# Patient Record
Sex: Male | Born: 1950 | Race: White | Hispanic: No | Marital: Married | Smoking: Former smoker
Health system: Southern US, Community
[De-identification: ages and names within clinical notes are randomized; demographics above are authoritative.]

## PROBLEM LIST (undated history)

## (undated) DIAGNOSIS — I2699 Other pulmonary embolism without acute cor pulmonale: Secondary | ICD-10-CM

## (undated) DIAGNOSIS — G2111 Neuroleptic induced parkinsonism: Secondary | ICD-10-CM

## (undated) DIAGNOSIS — E039 Hypothyroidism, unspecified: Secondary | ICD-10-CM

## (undated) DIAGNOSIS — R0602 Shortness of breath: Secondary | ICD-10-CM

## (undated) DIAGNOSIS — E785 Hyperlipidemia, unspecified: Secondary | ICD-10-CM

## (undated) DIAGNOSIS — F419 Anxiety disorder, unspecified: Secondary | ICD-10-CM

## (undated) DIAGNOSIS — I10 Essential (primary) hypertension: Secondary | ICD-10-CM

## (undated) DIAGNOSIS — Z7901 Long term (current) use of anticoagulants: Secondary | ICD-10-CM

## (undated) DIAGNOSIS — Z9181 History of falling: Secondary | ICD-10-CM

## (undated) DIAGNOSIS — J449 Chronic obstructive pulmonary disease, unspecified: Secondary | ICD-10-CM

## (undated) DIAGNOSIS — I219 Acute myocardial infarction, unspecified: Secondary | ICD-10-CM

## (undated) DIAGNOSIS — F319 Bipolar disorder, unspecified: Secondary | ICD-10-CM

## (undated) HISTORY — PX: ROTATOR CUFF REPAIR: SHX139

---

## 2001-09-25 ENCOUNTER — Inpatient Hospital Stay (HOSPITAL_COMMUNITY): Admission: EM | Admit: 2001-09-25 | Discharge: 2001-10-14 | Payer: Self-pay | Admitting: Psychiatry

## 2001-10-16 ENCOUNTER — Other Ambulatory Visit (HOSPITAL_COMMUNITY): Admission: RE | Admit: 2001-10-16 | Discharge: 2001-10-17 | Payer: Self-pay | Admitting: Psychiatry

## 2004-10-15 DIAGNOSIS — I219 Acute myocardial infarction, unspecified: Secondary | ICD-10-CM

## 2004-10-15 HISTORY — DX: Acute myocardial infarction, unspecified: I21.9

## 2011-10-31 ENCOUNTER — Other Ambulatory Visit: Payer: Self-pay

## 2011-10-31 ENCOUNTER — Emergency Department (HOSPITAL_COMMUNITY): Payer: Medicare Other

## 2011-10-31 ENCOUNTER — Observation Stay (HOSPITAL_COMMUNITY)
Admission: EM | Admit: 2011-10-31 | Discharge: 2011-11-05 | Disposition: A | Discharge status: Nursing Home (Includes ICF) | Payer: Medicare Other | Attending: Internal Medicine | Admitting: Internal Medicine

## 2011-10-31 ENCOUNTER — Encounter (HOSPITAL_COMMUNITY): Payer: Self-pay | Admitting: Emergency Medicine

## 2011-10-31 DIAGNOSIS — R0989 Other specified symptoms and signs involving the circulatory and respiratory systems: Secondary | ICD-10-CM | POA: Insufficient documentation

## 2011-10-31 DIAGNOSIS — R0602 Shortness of breath: Principal | ICD-10-CM | POA: Insufficient documentation

## 2011-10-31 DIAGNOSIS — Z86718 Personal history of other venous thrombosis and embolism: Secondary | ICD-10-CM | POA: Insufficient documentation

## 2011-10-31 DIAGNOSIS — F411 Generalized anxiety disorder: Secondary | ICD-10-CM | POA: Insufficient documentation

## 2011-10-31 DIAGNOSIS — F0280 Dementia in other diseases classified elsewhere without behavioral disturbance: Secondary | ICD-10-CM | POA: Insufficient documentation

## 2011-10-31 DIAGNOSIS — G3183 Dementia with Lewy bodies: Secondary | ICD-10-CM | POA: Insufficient documentation

## 2011-10-31 DIAGNOSIS — F3130 Bipolar disorder, current episode depressed, mild or moderate severity, unspecified: Secondary | ICD-10-CM | POA: Insufficient documentation

## 2011-10-31 DIAGNOSIS — E785 Hyperlipidemia, unspecified: Secondary | ICD-10-CM | POA: Insufficient documentation

## 2011-10-31 DIAGNOSIS — R0600 Dyspnea, unspecified: Secondary | ICD-10-CM

## 2011-10-31 DIAGNOSIS — J449 Chronic obstructive pulmonary disease, unspecified: Secondary | ICD-10-CM

## 2011-10-31 DIAGNOSIS — Z79899 Other long term (current) drug therapy: Secondary | ICD-10-CM | POA: Insufficient documentation

## 2011-10-31 DIAGNOSIS — I519 Heart disease, unspecified: Secondary | ICD-10-CM | POA: Insufficient documentation

## 2011-10-31 DIAGNOSIS — F319 Bipolar disorder, unspecified: Secondary | ICD-10-CM

## 2011-10-31 DIAGNOSIS — R0789 Other chest pain: Secondary | ICD-10-CM

## 2011-10-31 DIAGNOSIS — R251 Tremor, unspecified: Secondary | ICD-10-CM

## 2011-10-31 DIAGNOSIS — J4489 Other specified chronic obstructive pulmonary disease: Secondary | ICD-10-CM | POA: Insufficient documentation

## 2011-10-31 DIAGNOSIS — I10 Essential (primary) hypertension: Secondary | ICD-10-CM | POA: Insufficient documentation

## 2011-10-31 DIAGNOSIS — IMO0001 Reserved for inherently not codable concepts without codable children: Secondary | ICD-10-CM | POA: Diagnosis present

## 2011-10-31 DIAGNOSIS — I2699 Other pulmonary embolism without acute cor pulmonale: Secondary | ICD-10-CM | POA: Insufficient documentation

## 2011-10-31 DIAGNOSIS — R259 Unspecified abnormal involuntary movements: Secondary | ICD-10-CM | POA: Insufficient documentation

## 2011-10-31 DIAGNOSIS — R0609 Other forms of dyspnea: Secondary | ICD-10-CM | POA: Insufficient documentation

## 2011-10-31 HISTORY — DX: Hyperlipidemia, unspecified: E78.5

## 2011-10-31 HISTORY — DX: Chronic obstructive pulmonary disease, unspecified: J44.9

## 2011-10-31 HISTORY — DX: Essential (primary) hypertension: I10

## 2011-10-31 HISTORY — DX: History of falling: Z91.81

## 2011-10-31 HISTORY — DX: Anxiety disorder, unspecified: F41.9

## 2011-10-31 HISTORY — DX: Bipolar disorder, unspecified: F31.9

## 2011-10-31 HISTORY — DX: Other pulmonary embolism without acute cor pulmonale: I26.99

## 2011-10-31 HISTORY — DX: Neuroleptic induced parkinsonism: G21.11

## 2011-10-31 LAB — CBC
HCT: 41.4 % (ref 39.0–52.0)
MCV: 85.2 fL (ref 78.0–100.0)
RBC: 4.86 MIL/uL (ref 4.22–5.81)
WBC: 9 10*3/uL (ref 4.0–10.5)

## 2011-10-31 LAB — COMPREHENSIVE METABOLIC PANEL
AST: 19 U/L (ref 0–37)
Albumin: 3.2 g/dL — ABNORMAL LOW (ref 3.5–5.2)
BUN: 14 mg/dL (ref 6–23)
Creatinine, Ser: 1.09 mg/dL (ref 0.50–1.35)
Potassium: 3.5 mEq/L (ref 3.5–5.1)
Total Protein: 6.8 g/dL (ref 6.0–8.3)

## 2011-10-31 LAB — CARDIAC PANEL(CRET KIN+CKTOT+MB+TROPI)
CK, MB: 3.7 ng/mL (ref 0.3–4.0)
Relative Index: 3.1 — ABNORMAL HIGH (ref 0.0–2.5)
Total CK: 120 U/L (ref 7–232)
Troponin I: <0.3 ng/mL (ref <0.30)

## 2011-10-31 LAB — PROTIME-INR: INR: 2.44 — ABNORMAL HIGH (ref 0.00–1.49)

## 2011-10-31 MED ORDER — MORPHINE SULFATE 4 MG/ML IJ SOLN
4.0000 mg | Freq: Once | INTRAMUSCULAR | Status: AC
  Administered 2011-10-31: 4 mg via INTRAVENOUS
  Filled 2011-10-31: qty 1

## 2011-10-31 MED ORDER — ASPIRIN 81 MG PO CHEW
324.0000 mg | CHEWABLE_TABLET | Freq: Once | ORAL | Status: AC
  Administered 2011-10-31: 324 mg via ORAL
  Filled 2011-10-31: qty 4

## 2011-10-31 MED ORDER — ALBUTEROL SULFATE (5 MG/ML) 0.5% IN NEBU
5.0000 mg | INHALATION_SOLUTION | Freq: Once | RESPIRATORY_TRACT | Status: AC
  Administered 2011-10-31: 5 mg via RESPIRATORY_TRACT
  Filled 2011-10-31: qty 1

## 2011-10-31 NOTE — ED Notes (Signed)
UJW:[id number]<YN> Expected date:10/31/11<BR> Expected time: 6:29 PM<BR> Means of arrival:Ambulance<BR> Comments:<BR> EMS 62 GC - subjective sob

## 2011-10-31 NOTE — ED Notes (Signed)
Per EMS, increased shortness of breath since this AM at appx 0700; lung sounds clear, respirations nonlabored; hx of PE; denies pain at this time; pt appears anxious

## 2011-10-31 NOTE — ED Provider Notes (Signed)
History     CSN: [id number]  Arrival date & time 10/31/11  1478   First MD Initiated Contact with Patient 10/31/11 1935      Chief Complaint  Patient presents with  . Shortness of Breath    (Consider location/radiation/quality/duration/timing/severity/associated sxs/prior treatment) Patient is a 61 y.o. male presenting with shortness of breath. The history is provided by the patient.  Shortness of Breath  The current episode started 3 to 5 days ago. The problem has been gradually worsening. The problem is moderate. Associated symptoms include chest pain, chest pressure and shortness of breath. Pertinent negatives include no fever, no cough and no wheezing.  Pt was discharged from hosp on Monday after being diagnosed with multiple PE's. Returns to ED with worsening SOB and chest tightness. No cough, fever, chills, abd pain, N/V/D.   Past Medical History  Diagnosis Date  . COPD (chronic obstructive pulmonary disease)   . Pulmonary embolus   . Hypertension   . Bipolar 1 disorder   . Neuroleptic-induced parkinsonism   . Anxiety   . Hyperlipidemia   . Falls infrequently     History reviewed. No pertinent past surgical history.  No family history on file.  History  Substance Use Topics  . Smoking status: Never Smoker   . Smokeless tobacco: Not on file  . Alcohol Use: No      Review of Systems  Constitutional: Negative for fever and chills.  Respiratory: Positive for chest tightness and shortness of breath. Negative for cough and wheezing.   Cardiovascular: Positive for chest pain.  Gastrointestinal: Negative for nausea, vomiting, abdominal pain and constipation.  Musculoskeletal: Negative for myalgias, back pain and arthralgias.  Skin: Negative for pallor and rash.  Neurological: Negative for weakness, numbness and headaches.    Allergies  Benzo-benzyl alc-cetylpridinium  Home Medications   Current Outpatient Rx  Name Route Sig Dispense Refill  . ALBUTEROL  SULFATE HFA 108 (90 BASE) MCG/ACT IN AERS Inhalation Inhale 2 puffs into the lungs every 6 (six) hours as needed.    Marland Kitchen DIVALPROEX SODIUM 250 MG PO TBEC Oral Take 250 mg by mouth at bedtime. Take 3-4 tablets by mouth everyday    . FENOFIBRATE 145 MG PO TABS Oral Take 145 mg by mouth daily.    Marland Kitchen FLUTICASONE-SALMETEROL 250-50 MCG/DOSE IN AEPB Inhalation Inhale 1 puff into the lungs 2 (two) times daily.    Marland Kitchen HYDROCHLOROTHIAZIDE 25 MG PO TABS Oral Take 25 mg by mouth daily.    Marland Kitchen LEVOTHYROXINE SODIUM 75 MCG PO TABS Oral Take 75 mcg by mouth daily.    Marland Kitchen METOPROLOL TARTRATE 25 MG PO TABS Oral Take 12.5 mg by mouth. Takes half a tablet    . OLANZAPINE 20 MG PO TABS Oral Take 20 mg by mouth at bedtime.    Marland Kitchen OXCARBAZEPINE 300 MG PO TABS Oral Take 300 mg by mouth 2 (two) times daily.    . WARFARIN SODIUM 2.5 MG PO TABS Oral Take 2.5 mg by mouth daily.      BP 135/88  Pulse 88  Temp(Src) 99.4 F (37.4 C) (Oral)  Resp 18  SpO2 97%  Physical Exam  Nursing note and vitals reviewed. Constitutional: He is oriented to person, place, and time. He appears well-developed and well-nourished.       Anxious appearing  HENT:  Head: Normocephalic and atraumatic.  Mouth/Throat: Oropharynx is clear and moist.  Eyes: EOM are normal. Pupils are equal, round, and reactive to light.  Neck: Normal range of  motion. Neck supple.  Cardiovascular: Normal rate and regular rhythm.   Pulmonary/Chest: Effort normal and breath sounds normal. No respiratory distress. He has no wheezes. He has no rales. He exhibits no tenderness.  Abdominal: Soft. Bowel sounds are normal. He exhibits no mass. There is no tenderness. There is no rebound and no guarding.  Musculoskeletal: Normal range of motion. He exhibits no edema and no tenderness.  Neurological: He is alert and oriented to person, place, and time.       Moves all ext, no gross deficits  Skin: Skin is warm and dry. No rash noted. No erythema.  Psychiatric: He has a normal  mood and affect. His behavior is normal.    ED Course  Procedures (including critical care time)  Labs Reviewed  COMPREHENSIVE METABOLIC PANEL - Abnormal; Notable for the following:    Sodium 133 (*)    Glucose, Bld 119 (*)    Albumin 3.2 (*)    Total Bilirubin 0.2 (*)    GFR calc non Af Amer 72 (*)    GFR calc Af Amer 83 (*)    All other components within normal limits  APTT - Abnormal; Notable for the following:    aPTT 41 (*)    All other components within normal limits  PROTIME-INR - Abnormal; Notable for the following:    Prothrombin Time 26.9 (*)    INR 2.44 (*)    All other components within normal limits  CARDIAC PANEL(CRET KIN+CKTOT+MB+TROPI) - Abnormal; Notable for the following:    Relative Index 3.1 (*)    All other components within normal limits  CBC   Dg Chest 2 View  10/31/2011  *RADIOLOGY REPORT*  Clinical Data: Shortness of breath.  No chest complaints.  History of COPD.  CHEST - 2 VIEW  Comparison: None.  Findings: Low lung volumes.  Cardiomegaly.  Small bilateral effusions are suspected.  Prominent central markings could represent bronchitic change or viral pneumonitis. No definite failure or infiltrates.  Advanced degenerative change right shoulder.  IMPRESSION: Low lung volumes with cardiomegaly.  Small bilateral effusions. Prominent central markings representing either bronchitic change or viral pneumonitis. No infiltrates or failure.  Original Report Authenticated By: Elsie Stain, M.D.     1. Dyspnea   2. Chest heaviness      Date: 10/31/2011  Rate:89  Rhythm: normal sinus rhythm  QRS Axis: normal  Intervals: normal  ST/T Wave abnormalities: normal  Conduction Disutrbances:none  Narrative Interpretation:   Old EKG Reviewed: none available   MDM  Will discuss admission with hospitalist        Loren Racer, MD 10/31/11 2354

## 2011-11-01 ENCOUNTER — Encounter (HOSPITAL_COMMUNITY): Payer: Self-pay | Admitting: Internal Medicine

## 2011-11-01 DIAGNOSIS — F411 Generalized anxiety disorder: Secondary | ICD-10-CM

## 2011-11-01 DIAGNOSIS — IMO0001 Reserved for inherently not codable concepts without codable children: Secondary | ICD-10-CM | POA: Diagnosis present

## 2011-11-01 DIAGNOSIS — R251 Tremor, unspecified: Secondary | ICD-10-CM

## 2011-11-01 DIAGNOSIS — J449 Chronic obstructive pulmonary disease, unspecified: Secondary | ICD-10-CM

## 2011-11-01 DIAGNOSIS — G20 Parkinson's disease: Secondary | ICD-10-CM

## 2011-11-01 DIAGNOSIS — I2699 Other pulmonary embolism without acute cor pulmonale: Secondary | ICD-10-CM

## 2011-11-01 DIAGNOSIS — F319 Bipolar disorder, unspecified: Secondary | ICD-10-CM

## 2011-11-01 DIAGNOSIS — G20A1 Parkinson's disease without dyskinesia, without mention of fluctuations: Secondary | ICD-10-CM

## 2011-11-01 MED ORDER — ADULT MULTIVITAMIN W/MINERALS CH
1.0000 | ORAL_TABLET | ORAL | Status: DC
  Administered 2011-11-01 – 2011-11-04 (×4): 1 via ORAL
  Filled 2011-11-01 (×5): qty 1

## 2011-11-01 MED ORDER — ALUM & MAG HYDROXIDE-SIMETH 200-200-20 MG/5ML PO SUSP: 30.0000 mL | Freq: Four times a day (QID) | ORAL | Status: DC | PRN

## 2011-11-01 MED ORDER — METOPROLOL TARTRATE 25 MG PO TABS
25.0000 mg | ORAL_TABLET | Freq: Two times a day (BID) | ORAL | Status: DC
  Administered 2011-11-01 – 2011-11-04 (×7): 25 mg via ORAL
  Filled 2011-11-01 (×8): qty 1

## 2011-11-01 MED ORDER — ALBUTEROL SULFATE (5 MG/ML) 0.5% IN NEBU: 2.5000 mg | INHALATION_SOLUTION | RESPIRATORY_TRACT | Status: DC | PRN

## 2011-11-01 MED ORDER — OLANZAPINE 10 MG PO TABS
20.0000 mg | ORAL_TABLET | Freq: Every day | ORAL | Status: DC
  Administered 2011-11-01 – 2011-11-04 (×4): 20 mg via ORAL
  Filled 2011-11-01 (×5): qty 2

## 2011-11-01 MED ORDER — SODIUM CHLORIDE 0.9 % IV SOLN: INTRAVENOUS | Status: DC

## 2011-11-01 MED ORDER — FENOFIBRATE 160 MG PO TABS
160.0000 mg | ORAL_TABLET | Freq: Every day | ORAL | Status: DC
  Administered 2011-11-01 – 2011-11-05 (×5): 160 mg via ORAL
  Filled 2011-11-01 (×5): qty 1

## 2011-11-01 MED ORDER — WARFARIN SODIUM 2.5 MG PO TABS
2.5000 mg | ORAL_TABLET | Freq: Every day | ORAL | Status: DC
  Filled 2011-11-01: qty 1

## 2011-11-01 MED ORDER — OXCARBAZEPINE 300 MG PO TABS
300.0000 mg | ORAL_TABLET | Freq: Two times a day (BID) | ORAL | Status: DC
  Administered 2011-11-01 – 2011-11-05 (×9): 300 mg via ORAL
  Filled 2011-11-01 (×10): qty 1

## 2011-11-01 MED ORDER — LEVOTHYROXINE SODIUM 75 MCG PO TABS
75.0000 ug | ORAL_TABLET | Freq: Every day | ORAL | Status: DC
  Administered 2011-11-01 – 2011-11-05 (×5): 75 ug via ORAL
  Filled 2011-11-01 (×5): qty 1

## 2011-11-01 MED ORDER — PROMETHAZINE HCL 25 MG PO TABS: 12.5000 mg | ORAL_TABLET | Freq: Four times a day (QID) | ORAL | Status: DC | PRN

## 2011-11-01 MED ORDER — DIVALPROEX SODIUM 250 MG PO DR TAB
250.0000 mg | DELAYED_RELEASE_TABLET | Freq: Every day | ORAL | Status: DC
  Administered 2011-11-01 – 2011-11-04 (×4): 250 mg via ORAL
  Filled 2011-11-01 (×5): qty 1

## 2011-11-01 MED ORDER — WARFARIN SODIUM 2.5 MG PO TABS
2.5000 mg | ORAL_TABLET | Freq: Once | ORAL | Status: AC
  Administered 2011-11-01: 2.5 mg via ORAL
  Filled 2011-11-01: qty 1

## 2011-11-01 MED ORDER — ACETAMINOPHEN 325 MG PO TABS
650.0000 mg | ORAL_TABLET | Freq: Four times a day (QID) | ORAL | Status: DC | PRN
  Filled 2011-11-01: qty 2

## 2011-11-01 MED ORDER — ALBUTEROL SULFATE HFA 108 (90 BASE) MCG/ACT IN AERS
2.0000 | INHALATION_SPRAY | Freq: Four times a day (QID) | RESPIRATORY_TRACT | Status: DC | PRN
  Filled 2011-11-01: qty 6.7

## 2011-11-01 MED ORDER — TIOTROPIUM BROMIDE MONOHYDRATE 18 MCG IN CAPS
18.0000 ug | ORAL_CAPSULE | Freq: Every day | RESPIRATORY_TRACT | Status: DC
  Administered 2011-11-01 – 2011-11-04 (×4): 18 ug via RESPIRATORY_TRACT
  Filled 2011-11-01 (×2): qty 5

## 2011-11-01 MED ORDER — ACETAMINOPHEN 650 MG RE SUPP: 650.0000 mg | Freq: Four times a day (QID) | RECTAL | Status: DC | PRN

## 2011-11-01 MED ORDER — DOCUSATE SODIUM 100 MG PO CAPS
100.0000 mg | ORAL_CAPSULE | Freq: Two times a day (BID) | ORAL | Status: DC
  Administered 2011-11-01 – 2011-11-05 (×9): 100 mg via ORAL
  Filled 2011-11-01 (×10): qty 1

## 2011-11-01 MED ORDER — MORPHINE SULFATE (CONCENTRATE) 20 MG/ML PO SOLN
5.0000 mg | ORAL | Status: DC | PRN
  Administered 2011-11-01: 5 mg via ORAL
  Filled 2011-11-01: qty 1

## 2011-11-01 MED ORDER — PROMETHAZINE HCL 25 MG/ML IJ SOLN: 12.5000 mg | Freq: Four times a day (QID) | INTRAMUSCULAR | Status: DC | PRN

## 2011-11-01 MED ORDER — FLUTICASONE-SALMETEROL 250-50 MCG/DOSE IN AEPB
1.0000 | INHALATION_SPRAY | Freq: Two times a day (BID) | RESPIRATORY_TRACT | Status: DC
Start: 2011-11-01 — End: 2011-11-05
  Administered 2011-11-01 – 2011-11-05 (×9): 1 via RESPIRATORY_TRACT
  Filled 2011-11-01 (×2): qty 14

## 2011-11-01 NOTE — Progress Notes (Signed)
  Echocardiogram 2D Echocardiogram has been performed.  Cathie Beams Deneen 11/01/2011, 12:17 PM

## 2011-11-01 NOTE — Progress Notes (Signed)
ANTICOAGULATION CONSULT NOTE - Initial Consult  Pharmacy Consult for Coumadin Indication: PE (new)   Allergies  Allergen Reactions  . Benzodiazepines Other (See Comments)    Causes aggression, paradoxical reaction    Patient Measurements: Height: 5\' 8"  (172.7 cm) Weight: 221 lb 1.6 oz (100.29 kg) IBW/kg (Calculated) : 68.4    Vital Signs: Temp: 98 F (36.7 C) (01/17 0545) Temp src: Oral (01/17 0407) BP: 121/77 mmHg (01/17 0545) Pulse Rate: 79  (01/17 0545)  Labs:  [name] 10/31/11 2027 10/31/11 2026  HGB -- 14.1  HCT -- 41.4  PLT -- 400  APTT -- 41*  LABPROT -- 26.9*  INR -- 2.44*  HEPARINUNFRC -- --  CREATININE -- 1.09  CKTOTAL 120 --  CKMB 3.7 --  TROPONINI <0.30 --   Estimated Creatinine Clearance: 82.8 ml/min (by C-G formula based on Cr of 1.09).  Medical History: Past Medical History  Diagnosis Date  . COPD (chronic obstructive pulmonary disease)   . Pulmonary embolus   . Hypertension   . Bipolar 1 disorder   . Neuroleptic-induced parkinsonism   . Anxiety   . Hyperlipidemia   . Falls infrequently     Medications:  Scheduled:    . albuterol  5 mg Nebulization Once  . aspirin  324 mg Oral Once  . divalproex  250 mg Oral QHS  . docusate sodium  100 mg Oral BID  . fenofibrate  160 mg Oral Daily  . Fluticasone-Salmeterol  1 puff Inhalation BID  . levothyroxine  75 mcg Oral Daily  . metoprolol tartrate  25 mg Oral BID  .  morphine injection  4 mg Intravenous Once  . mulitivitamin with minerals  1 tablet Oral Q24H  . OLANZapine  20 mg Oral QHS  . Oxcarbazepine  300 mg Oral BID  . tiotropium  18 mcg Inhalation Daily  . warfarin  2.5 mg Oral Daily   Infusions:    . sodium chloride      Assessment: 61 yo discharged from  [facility] on coumadin for large bilateral PEs.  Goal of Therapy:  INR 2-3   Plan:  Coumadin 2.5mg  x1 today. F/U PT/INR  daily   Derek Hicks 11/01/2011,6:53 AM

## 2011-11-01 NOTE — Progress Notes (Signed)
Derek Hicks is a 61 y.o. male patient admitted with shortness of breath associated with recently diagnosed PE and COPD. I have reviewed his chart, seen and examined him at bed side.  SUBJECTIVE "I feel ok".   1. Dyspnea   2. Chest heaviness   3. Pulmonary embolism     Past Medical History  Diagnosis Date  . COPD (chronic obstructive pulmonary disease)   . Pulmonary embolus   . Hypertension   . Bipolar 1 disorder   . Neuroleptic-induced parkinsonism   . Anxiety   . Hyperlipidemia   . Falls infrequently    Current Facility-Administered Medications  Medication Dose Route Frequency Provider Last Rate Last Dose  . acetaminophen (TYLENOL) tablet 650 mg  650 mg Oral Q6H PRN Anderson Malta       Or  . acetaminophen (TYLENOL) suppository 650 mg  650 mg Rectal Q6H PRN Anderson Malta      . albuterol (PROVENTIL HFA;VENTOLIN HFA) 108 (90 BASE) MCG/ACT inhaler 2 puff  2 puff Inhalation Q6H PRN Anderson Malta      . albuterol (PROVENTIL) (5 MG/ML) 0.5% nebulizer solution 2.5 mg  2.5 mg Nebulization Q2H PRN Anderson Malta      . albuterol (PROVENTIL) (5 MG/ML) 0.5% nebulizer solution 5 mg  5 mg Nebulization Once Loren Racer, MD   5 mg at 10/31/11 2349  . alum & mag hydroxide-simeth (MAALOX/MYLANTA) 200-200-20 MG/5ML suspension 30 mL  30 mL Oral Q6H PRN Anderson Malta      . aspirin chewable tablet 324 mg  324 mg Oral Once Loren Racer, MD   324 mg at 10/31/11 2033  . divalproex (DEPAKOTE) DR tablet 250 mg  250 mg Oral QHS Anderson Malta      . docusate sodium (COLACE) capsule 100 mg  100 mg Oral BID Anderson Malta   100 mg at 11/01/11 0912  . fenofibrate tablet 160 mg  160 mg Oral Daily Elizabeth Golding   160 mg at 11/01/11 0912  . Fluticasone-Salmeterol (ADVAIR) 250-50 MCG/DOSE inhaler 1 puff  1 puff Inhalation BID Anderson Malta   1 puff at 11/01/11 0850  . levothyroxine (SYNTHROID, LEVOTHROID) tablet 75 mcg  75 mcg Oral Daily Anderson Malta   75 mcg at  11/01/11 0912  . metoprolol tartrate (LOPRESSOR) tablet 25 mg  25 mg Oral BID Anderson Malta   25 mg at 11/01/11 0913  . morphine 20 MG/ML concentrated solution 5-10 mg  5-10 mg Oral Q2H PRN Anderson Malta      . morphine 4 MG/ML injection 4 mg  4 mg Intravenous Once Loren Racer, MD   4 mg at 10/31/11 2044  . mulitivitamin with minerals tablet 1 tablet  1 tablet Oral Q24H Anderson Malta      . OLANZapine (ZYPREXA) tablet 20 mg  20 mg Oral QHS Anderson Malta      . Oxcarbazepine (TRILEPTAL) tablet 300 mg  300 mg Oral BID Anderson Malta   300 mg at 11/01/11 0912  . promethazine (PHENERGAN) tablet 12.5 mg  12.5 mg Oral Q6H PRN Anderson Malta       Or  . promethazine (PHENERGAN) injection 12.5 mg  12.5 mg Intravenous Q6H PRN Anderson Malta      . tiotropium (SPIRIVA) inhalation capsule 18 mcg  18 mcg Inhalation Daily Anderson Malta   18 mcg at 11/01/11 0851  . warfarin (COUMADIN) tablet 2.5 mg  2.5 mg Oral ONCE-1800 Lorenza Evangelist, PHARMD      . DISCONTD: 0.9 %  sodium  chloride infusion   Intravenous Continuous Anderson Malta      . DISCONTD: warfarin (COUMADIN) tablet 2.5 mg  2.5 mg Oral Daily Anderson Malta       Allergies  Allergen Reactions  . Benzodiazepines Other (See Comments)    Causes aggression, paradoxical reaction   Active Problems:  Bipolar disorder  Pulmonary embolism  Shortness of breath dyspnea  Anxiety state  COPD (chronic obstructive pulmonary disease)  Tremor  Dementia in Parkinson's disease   Vital signs in last 24 hours: Temp:  [98 F (36.7 C)-99.4 F (37.4 C)] 98 F (36.7 C) (01/17 0545) Pulse Rate:  [79-88] 79  (01/17 0545) Resp:  [17-18] 17  (01/17 0545) BP: (121-135)/(77-88) 121/77 mmHg (01/17 0545) SpO2:  [95 %-97 %] 95 % (01/17 0852) Weight:  [100.29 kg (221 lb 1.6 oz)] 100.29 kg (221 lb 1.6 oz) (01/17 0545) Weight change:  Last BM Date: 10/31/11  Intake/Output from previous day:   Intake/Output this  shift: Total I/O In: 300 [P.O.:300] Out: 175 [Urine:175]  Lab Results:  [facility] 10/31/11 2026  WBC 9.0  HGB 14.1  HCT 41.4  PLT 400   BMET  Basename 10/31/11 2026  NA 133*  K 3.5  CL 96  CO2 25  GLUCOSE 119*  BUN 14  CREATININE 1.09  CALCIUM 9.5    Studies/Results: Dg Chest 2 View  10/31/2011  *RADIOLOGY REPORT*  Clinical Data: Shortness of breath.  No chest complaints.  History of COPD.  CHEST - 2 VIEW  Comparison: None.  Findings: Low lung volumes.  Cardiomegaly.  Small bilateral effusions are suspected.  Prominent central markings could represent bronchitic change or viral pneumonitis. No definite failure or infiltrates.  Advanced degenerative change right shoulder.  IMPRESSION: Low lung volumes with cardiomegaly.  Small bilateral effusions. Prominent central markings representing either bronchitic change or viral pneumonitis. No infiltrates or failure.  Original Report Authenticated By: Elsie Stain, M.D.    Medications: I have reviewed the patient's current medications.   Physical exam GENERAL- alert HEAD- normal atraumatic, no neck masses, normal thyroid, no jvd RESPIRATORY- appears well, vitals normal, no respiratory distress, acyanotic, normal RR, ear and throat exam is normal, neck free of mass or lymphadenopathy, chest clear, no wheezing, crepitations, rhonchi, normal symmetric air entry CVS- regular rate and rhythm, S1, S2 normal, no murmur, click, rub or gallop ABDOMEN- abdomen is soft without significant tenderness, masses, organomegaly or guarding NEURO- no acute deficits. EXTREMITIES- extremities normal, atraumatic, no cyanosis or edema  Plan 1. Bipolar disorder/Anxiety state - seems stable. Continue home meds. 2. Pulmonary embolism/ Shortness of breath dyspnea/COPD (chronic obstructive pulmonary disease)- says less dyspneic. Await 2decho. D/C ivf.  3. Tremor/ Dementia in Parkinson's disease- stable. Continue current mx.  4. Disposition- eventually  back to facility.    Ger Ringenberg 11/01/2011 2:05 PM Pager: [phone].

## 2011-11-01 NOTE — H&P (Signed)
Hospital Admission Note Date: 11/01/2011  Patient name: Derek Hicks Medical record number: [id number] Date of birth: [birth year] Age: 61 y.o. Gender: male PCP: No primary provider on file.  Medical Service: [facility] Team 5  Chief Complaint: Dyspnea, SOB  History of Present Illness: Derek Hicks is a pleasant, but very unfortunate gentleman who has a past medical history of severe depression and a parkinson's like neurodegenerative disoprder that at least once a year renders him completely bedbound and contributes to generalized immobility. 1 year ago he developed a DVT and was placed on coumadin therapy for 6 months, recently discontinued. He was admitted at [facility] days ago and was found to have large bilateral PE's (records requested). He and his wife feel like he was dischareged prematurely as he is still significantly dyspniec, struggling with air hunger and feels intense anxiety related to his difficulty breathing. "I feel like I am dying". They did not want to return to [facility] and the patient just recently moved into [facility] which is closer to [facility].   Meds: Medications Prior to Admission  Medication Dose Route Frequency Provider Last Rate Last Dose  . albuterol (PROVENTIL) (5 MG/ML) 0.5% nebulizer solution 5 mg  5 mg Nebulization Once Loren Racer, MD   5 mg at 10/31/11 2349  . aspirin chewable tablet 324 mg  324 mg Oral Once Loren Racer, MD   324 mg at 10/31/11 2033  . morphine 4 MG/ML injection 4 mg  4 mg Intravenous Once Loren Racer, MD   4 mg at 10/31/11 2044   No current outpatient prescriptions on file as of 10/31/2011.    Allergies: Benzodiazepines Past Medical History  Diagnosis Date  . COPD (chronic obstructive pulmonary disease)   . Pulmonary embolus   . Hypertension   . Bipolar 1 disorder   . Neuroleptic-induced parkinsonism   . Anxiety   . Hyperlipidemia   . Falls infrequently    History reviewed. No pertinent past surgical history. No family  history on file. History   Social History  . Marital Status: Married    Spouse Name: N/A    Number of Children: N/A  . Years of Education: N/A   Occupational History  . Not on file.   Social History Main Topics  . Smoking status: Never Smoker   . Smokeless tobacco: Not on file  . Alcohol Use: No  . Drug Use: No  . Sexually Active:    Other Topics Concern  . Not on file   Social History Narrative  . No narrative on file    Review of Systems: Pertinent items are noted in HPI.  Physical Exam: Blood pressure 135/88, pulse 88, temperature 99.4 F (37.4 C), temperature source Oral, resp. rate 18, SpO2 95.00%. General appearance: alert, cooperative, mild distress, pale and slowed mentation Head: Normocephalic, without obvious abnormality, atraumatic Lungs: diminished breath sounds anterior - bilateral, bibasilar and bilaterally and wheezes bilaterally Chest wall: no tenderness Heart: regular rate and rhythm, S1, S2 normal, no murmur, click, rub or gallop Abdomen: soft, non-tender; bowel sounds normal; no masses,  no organomegaly Extremities: extremities normal, atraumatic, no cyanosis or edema Pulses: 2+ and symmetric Skin: Skin color, texture, turgor normal. No rashes or lesions Neurologic: Mental status: Alert, oriented, thought content appropriate, severe resting tremor, mild rigidity, sluggish mental ability  Lab results: Basic Metabolic Panel:  Basename 10/31/11 2026  NA 133*  K 3.5  CL 96  CO2 25  GLUCOSE 119*  BUN 14  CREATININE 1.09  CALCIUM  9.5  MG --  PHOS --   Liver Function Tests:  [facility] 10/31/11 2026  AST 19  ALT 22  ALKPHOS 45  BILITOT 0.2*  PROT 6.8  ALBUMIN 3.2*   No results found for this basename: LIPASE:2,AMYLASE:2 in the last 72 hours No results found for this basename: AMMONIA:2 in the last 72 hours CBC:  Basename 10/31/11 2026  WBC 9.0  NEUTROABS --  HGB 14.1  HCT 41.4  MCV 85.2  PLT 400   Cardiac Enzymes:  Basename  10/31/11 2027  CKTOTAL 120  CKMB 3.7  CKMBINDEX --  TROPONINI <0.30   Coagulation:  Basename 10/31/11 2026  LABPROT 26.9*  INR 2.44*   Imaging results:  Dg Chest 2 View  10/31/2011  *RADIOLOGY REPORT*  Clinical Data: Shortness of breath.  No chest complaints.  History of COPD.  CHEST - 2 VIEW  Comparison: None.  Findings: Low lung volumes.  Cardiomegaly.  Small bilateral effusions are suspected.  Prominent central markings could represent bronchitic change or viral pneumonitis. No definite failure or infiltrates.  Advanced degenerative change right shoulder.  IMPRESSION: Low lung volumes with cardiomegaly.  Small bilateral effusions. Prominent central markings representing either bronchitic change or viral pneumonitis. No infiltrates or failure.  Original Report Authenticated By: Elsie Stain, M.D.    Other results: EKG: normal EKG, normal sinus rhythm, unchanged from previous tracings, nonspecific ST and T waves changes.  Assessment & Plan by Problem: Active Problems:  Bipolar disorder  Pulmonary embolism  Shortness of breath dyspnea  Anxiety state  COPD (chronic obstructive pulmonary disease)  Tremor  Dementia in Parkinson's disease  1. Dyspnea, newly diagnosed Pulmonary embolism: Main reason he returned to hospital was poor symptom management, he continues to feel air hunger and is severely dyspneic. Fortunately hemodynamically he is stable. I mostly provided reassurance and education. I recommended starting low dose oral morphine Sublingual for rapid control of his SOB, will start prn, this can also continue on discharge-will help prevent re-admit for SOB. Ideally a benzo would be best but he has severe paradoxical  Rxn in past. I explained PE can take time to resolve- this may take several weeks/months to resolve. I also maximized his COPD regimen and added Spiriva and nebs. No evidence of right heart strain on EKG, will order 2D echo to r/o RHF or new strain RVOF problems. Will  need close outpatient follow-up.   2. Tremor/Neurodegenerative dx: Unclear etiology, chronic problem, not acute,. Being followed by outpatient neurology.  3. HTN: I discontinued his HCTZ because his Na was mildly low, should continue Beta blocker for BP control.  4. Anxiety/Depression: I discussed continuing his current medication regimen and following with psych in [facility]  5. Dispo: He is a new resident at [facility], he wants to return there when stable for discharge.  SignedAnderson Malta 11/01/2011, 1:24 AM

## 2011-11-02 LAB — BASIC METABOLIC PANEL
Chloride: 103 mEq/L (ref 96–112)
Creatinine, Ser: 1.07 mg/dL (ref 0.50–1.35)
GFR calc Af Amer: 85 mL/min — ABNORMAL LOW (ref >90)
Potassium: 4.6 mEq/L (ref 3.5–5.1)
Sodium: 135 mEq/L (ref 135–145)

## 2011-11-02 LAB — PRO B NATRIURETIC PEPTIDE: Pro B Natriuretic peptide (BNP): 210.7 pg/mL — ABNORMAL HIGH (ref 0–125)

## 2011-11-02 LAB — CBC
MCV: 87.5 fL (ref 78.0–100.0)
Platelets: 350 10*3/uL (ref 150–400)
RDW: 13.6 % (ref 11.5–15.5)
WBC: 6.8 10*3/uL (ref 4.0–10.5)

## 2011-11-02 LAB — PROTIME-INR
INR: 2.22 — ABNORMAL HIGH (ref 0.00–1.49)
Prothrombin Time: 25 seconds — ABNORMAL HIGH (ref 11.6–15.2)

## 2011-11-02 MED ORDER — FUROSEMIDE 20 MG PO TABS
20.0000 mg | ORAL_TABLET | Freq: Every day | ORAL | Status: DC
  Administered 2011-11-02 – 2011-11-05 (×4): 20 mg via ORAL
  Filled 2011-11-02 (×4): qty 1

## 2011-11-02 MED ORDER — WARFARIN SODIUM 2.5 MG PO TABS
2.5000 mg | ORAL_TABLET | Freq: Once | ORAL | Status: AC
  Administered 2011-11-02: 2.5 mg via ORAL
  Filled 2011-11-02: qty 1

## 2011-11-02 MED ORDER — POTASSIUM CHLORIDE CRYS ER 10 MEQ PO TBCR
10.0000 meq | EXTENDED_RELEASE_TABLET | Freq: Every day | ORAL | Status: DC
  Administered 2011-11-02 – 2011-11-05 (×4): 10 meq via ORAL
  Filled 2011-11-02 (×4): qty 1

## 2011-11-02 NOTE — Progress Notes (Signed)
CSW visited with the pt and completed psychosocial assessment (pls see shadow chart). Pt reports he has been a resident of [facility] for 2 days and he plans to return at discharge. Pt states he has great support from his wife. CSW spoke with Signe Colt at the facility and she reports the pt can return at discharge. CSW will continue to follow and offer support. Patrice Paradise, LCSWA 11/02/2011 10:20 AM [phone]

## 2011-11-02 NOTE — Progress Notes (Signed)
SUBJECTIVE Feels better. Less dyspneic.   1. Dyspnea   2. Chest heaviness   3. Pulmonary embolism     Past Medical History  Diagnosis Date  . COPD (chronic obstructive pulmonary disease)   . Pulmonary embolus   . Hypertension   . Bipolar 1 disorder   . Neuroleptic-induced parkinsonism   . Anxiety   . Hyperlipidemia   . Falls infrequently    Current Facility-Administered Medications  Medication Dose Route Frequency Provider Last Rate Last Dose  . acetaminophen (TYLENOL) tablet 650 mg  650 mg Oral Q6H PRN Anderson Malta, DO       Or  . acetaminophen (TYLENOL) suppository 650 mg  650 mg Rectal Q6H PRN Anderson Malta, DO      . albuterol (PROVENTIL HFA;VENTOLIN HFA) 108 (90 BASE) MCG/ACT inhaler 2 puff  2 puff Inhalation Q6H PRN Anderson Malta, DO      . albuterol (PROVENTIL) (5 MG/ML) 0.5% nebulizer solution 2.5 mg  2.5 mg Nebulization Q2H PRN Anderson Malta, DO      . alum & mag hydroxide-simeth (MAALOX/MYLANTA) 200-200-20 MG/5ML suspension 30 mL  30 mL Oral Q6H PRN Anderson Malta, DO      . divalproex (DEPAKOTE) DR tablet 250 mg  250 mg Oral QHS Anderson Malta, DO   250 mg at 11/01/11 2209  . docusate sodium (COLACE) capsule 100 mg  100 mg Oral BID Anderson Malta, DO   100 mg at 11/02/11 1047  . fenofibrate tablet 160 mg  160 mg Oral Daily Anderson Malta, DO   160 mg at 11/02/11 1047  . Fluticasone-Salmeterol (ADVAIR) 250-50 MCG/DOSE inhaler 1 puff  1 puff Inhalation BID Anderson Malta, DO   1 puff at 11/02/11 2057  . furosemide (LASIX) tablet 20 mg  20 mg Oral Daily Tonie Elsey, MD   20 mg at 11/02/11 1601  . levothyroxine (SYNTHROID, LEVOTHROID) tablet 75 mcg  75 mcg Oral Daily Anderson Malta, DO   75 mcg at 11/02/11 1047  . metoprolol tartrate (LOPRESSOR) tablet 25 mg  25 mg Oral BID Anderson Malta, DO   25 mg at 11/02/11 1047  . morphine 20 MG/ML concentrated solution 5-10 mg  5-10 mg Oral Q2H PRN Anderson Malta, DO   5 mg at 11/01/11 1509   . mulitivitamin with minerals tablet 1 tablet  1 tablet Oral Q24H Anderson Malta, DO   1 tablet at 11/02/11 1827  . OLANZapine (ZYPREXA) tablet 20 mg  20 mg Oral QHS Anderson Malta, DO   20 mg at 11/01/11 2208  . Oxcarbazepine (TRILEPTAL) tablet 300 mg  300 mg Oral BID Anderson Malta, DO   300 mg at 11/02/11 1047  . potassium chloride (K-DUR,KLOR-CON) CR tablet 10 mEq  10 mEq Oral Daily Ivory Bail, MD   10 mEq at 11/02/11 1601  . promethazine (PHENERGAN) tablet 12.5 mg  12.5 mg Oral Q6H PRN Anderson Malta, DO       Or  . promethazine (PHENERGAN) injection 12.5 mg  12.5 mg Intravenous Q6H PRN Anderson Malta, DO      . tiotropium [facility]) inhalation capsule 18 mcg  18 mcg Inhalation Daily Anderson Malta, DO   18 mcg at 11/02/11 0917  . warfarin (COUMADIN) tablet 2.5 mg  2.5 mg Oral ONCE-1800 Christine Lanora Manis [place], PHARMD   2.5 mg at 11/02/11 1827   Allergies  Allergen Reactions  . Benzodiazepines Other (See Comments)    Causes aggression, paradoxical reaction   Active Problems:  Bipolar disorder  Pulmonary embolism  Shortness of  breath dyspnea  Anxiety state  COPD (chronic obstructive pulmonary disease)  Tremor  Dementia in Parkinson's disease   Vital signs in last 24 hours: Temp:  [97.6 F (36.4 C)-98.4 F (36.9 C)] 97.6 F (36.4 C) (01/18 2057) Pulse Rate:  [58-79] 79  (01/18 2057) Resp:  [18-20] 18  (01/18 2057) BP: (112-150)/(74-87) 150/87 mmHg (01/18 2057) SpO2:  [93 %-98 %] 93 % (01/18 2057) Weight change:  Last BM Date: 11/02/11  Intake/Output from previous day: 01/17 0701 - 01/18 0700 In: 1840 [P.O.:940; I.V.:900] Out: 1550 [Urine:1550] Intake/Output this shift:    Lab Results:  Basename 11/02/11 0430 10/31/11 2026  WBC 6.8 9.0  HGB 13.1 14.1  HCT 39.8 41.4  PLT 350 400   BMET  Basename 11/02/11 0430 10/31/11 2026  NA 135 133*  K 4.6 3.5  CL 103 96  CO2 22 25  GLUCOSE 101* 119*  BUN 14 14  CREATININE 1.07 1.09  CALCIUM  8.5 9.5    Studies/Results: No results found.  Medications: I have reviewed the patient's current medications.   Physical exam GENERAL- alert HEAD- normal atraumatic, no neck masses, normal thyroid, no jvd RESPIRATORY- appears well, vitals normal, no respiratory distress, acyanotic, normal RR, ear and throat exam is normal, neck free of mass or lymphadenopathy, chest clear, no wheezing, crepitations, rhonchi, normal symmetric air entry CVS- regular rate and rhythm, S1, S2 normal, no murmur, click, rub or gallop ABDOMEN- abdomen is soft without significant tenderness, masses, organomegaly or guarding NEURO- speech impediment. EXTREMITIES- extremities normal, atraumatic, no cyanosis or edema  Plan  1. Bipolar disorder/Anxiety state  - seems stable. Continue home meds.  2. Pulmonary embolism/ Shortness of breath dyspnea/COPD (chronic obstructive pulmonary disease)- says less dyspneic.  2decho shows diastolic dysfunction. Start lasix.  3. Tremor/ Dementia in Parkinson's disease- stable. Continue current mx.  4. Disposition- eventually back to facility.         Derek Hicks 11/02/2011 9:16 PM Pager: [phone].

## 2011-11-02 NOTE — Progress Notes (Signed)
ANTICOAGULATION CONSULT NOTE  Pharmacy Consult for Coumadin Indication: PE (new)   Allergies  Allergen Reactions  . Benzodiazepines Other (See Comments)    Causes aggression, paradoxical reaction    Patient Measurements: Height: 5\' 8"  (172.7 cm) Weight: 221 lb 1.6 oz (100.29 kg) IBW/kg (Calculated) : 68.4    Vital Signs: Temp: 97.6 F (36.4 C) (01/18 0516) Temp src: Oral (01/18 0516) BP: 121/81 mmHg (01/18 0516) Pulse Rate: 58  (01/18 0516)  Labs:  Basename 11/02/11 0430 10/31/11 2027 10/31/11 2026  HGB 13.1 -- 14.1  HCT 39.8 -- 41.4  PLT 350 -- 400  APTT -- -- 41*  LABPROT 25.0* -- 26.9*  INR 2.22* -- 2.44*  HEPARINUNFRC -- -- --  CREATININE 1.07 -- 1.09  CKTOTAL -- 120 --  CKMB -- 3.7 --  TROPONINI -- <0.30 --   Estimated Creatinine Clearance: 84.3 ml/min (by C-G formula based on Cr of 1.07).  Medical History: Past Medical History  Diagnosis Date  . COPD (chronic obstructive pulmonary disease)   . Pulmonary embolus   . Hypertension   . Bipolar 1 disorder   . Neuroleptic-induced parkinsonism   . Anxiety   . Hyperlipidemia   . Falls infrequently     Medications:  Scheduled:     . divalproex  250 mg Oral QHS  . docusate sodium  100 mg Oral BID  . fenofibrate  160 mg Oral Daily  . Fluticasone-Salmeterol  1 puff Inhalation BID  . levothyroxine  75 mcg Oral Daily  . metoprolol tartrate  25 mg Oral BID  . mulitivitamin with minerals  1 tablet Oral Q24H  . OLANZapine  20 mg Oral QHS  . Oxcarbazepine  300 mg Oral BID  . tiotropium  18 mcg Inhalation Daily  . warfarin  2.5 mg Oral ONCE-1800    Assessment:  61 yo discharged from  [facility] on 1/14 on coumadin 2.5mg  PO daily for large bilateral PEs.  He has returned to the hospital on 1/16 with further symptoms   Therapeutic INR  Goal of Therapy:  INR 2-3   Plan:   Coumadin 2.5mg  PO today  Follow up PT/INR  daily   Lynann Beaver PharmD  Pager [phone] 11/02/2011 10:30 AM

## 2011-11-03 LAB — BASIC METABOLIC PANEL
BUN: 16 mg/dL (ref 6–23)
Chloride: 101 mEq/L (ref 96–112)
Glucose, Bld: 99 mg/dL (ref 70–99)
Potassium: 4 mEq/L (ref 3.5–5.1)

## 2011-11-03 LAB — CBC
HCT: 40.7 % (ref 39.0–52.0)
Hemoglobin: 13.5 g/dL (ref 13.0–17.0)
MCH: 28.8 pg (ref 26.0–34.0)
MCHC: 33.2 g/dL (ref 30.0–36.0)

## 2011-11-03 MED ORDER — WARFARIN SODIUM 2.5 MG PO TABS
2.5000 mg | ORAL_TABLET | Freq: Once | ORAL | Status: AC
  Administered 2011-11-03: 2.5 mg via ORAL
  Filled 2011-11-03: qty 1

## 2011-11-03 NOTE — Progress Notes (Signed)
Physical Therapy Evaluation Patient Details Name: Derek Hicks MRN: [id number] DOB: 1951-08-11 Today's Date: 11/03/2011  Pt is independent in functional mobility. No PT recommendations.  Problem List:  Patient Active Problem List  Diagnoses  . Bipolar disorder  . Pulmonary embolism  . Shortness of breath dyspnea  . Anxiety state  . COPD (chronic obstructive pulmonary disease)  . Tremor  . Dementia in Parkinson's disease    Past Medical History:  Past Medical History  Diagnosis Date  . COPD (chronic obstructive pulmonary disease)   . Pulmonary embolus   . Hypertension   . Bipolar 1 disorder   . Neuroleptic-induced parkinsonism   . Anxiety   . Hyperlipidemia   . Falls infrequently    Past Surgical History: History reviewed. No pertinent past surgical history.  PT Assessment/Plan/Recommendation PT Assessment Clinical Impression Statement: Pt is independent with functional mobility and gait. No follow up PT or DME PT Recommendation/Assessment: Patent does not need any further PT services No Skilled PT: Patient is independent with all acitivity/mobility PT Recommendation Follow Up Recommendations: No PT follow up Equipment Recommended: None recommended by PT PT Goals     PT Evaluation Precautions/Restrictions  Precautions Required Braces or Orthoses: No Restrictions Weight Bearing Restrictions: No Prior Functioning  Home Living Lives With: Other (Comment) (plans to go to an assisted living facility) Prior Function Level of Independence: Independent with gait;Other (comment) (sometimes uses a cane) Cognition Cognition Arousal/Alertness: Awake/alert Overall Cognitive Status: Appears within functional limits for tasks assessed Orientation Level: Oriented X4 Sensation/Coordination Coordination Gross Motor Movements are Fluid and Coordinated: No (pt with slight tremor noted) Coordination and Movement Description: pt appears to have some defecit in changing speeds  and coordination of movement, but he is functional with no balance loss Extremity Assessment RLE Assessment RLE Assessment: Within Functional Limits LLE Assessment LLE Assessment: Within Functional Limits Mobility (including Balance) Bed Mobility Bed Mobility: Yes Supine to Sit: 7: Independent Sit to Supine: 7: Independent Transfers Stand to Sit: 7: Independent Ambulation/Gait Ambulation/Gait: Yes Ambulation/Gait Assistance: 7: Independent Ambulation Distance (Feet): 400 Feet Assistive device: None Gait Pattern: Step-through pattern ( keeps trunk rigid but is able to turn head, can arm swing) Gait velocity: WFL, but pt is unable to increase and decrease speed alternately Stairs: No Wheelchair Mobility Wheelchair Mobility: No  Posture/Postural Control Posture/Postural Control: Postural limitations Postural Limitations: decreased available trunk rotation with scapulae protracted and shoulders forward and with internal rotation Balance Balance Assessed: No Exercise    End of Session PT - End of Session Equipment Utilized During Treatment: Other (comment) (none) Activity Tolerance: Patient tolerated treatment well;Other (comment) (mild dyspnea after walk. sats 95%) Patient left: in bed Nurse Communication: Mobility status for ambulation;Mobility status for transfers General Behavior During Session: [facility] for tasks performed Cognition: [facility] for tasks performed  Donnetta Hail 11/03/2011, 1:56 PM

## 2011-11-03 NOTE — Progress Notes (Signed)
SUBJECTIVE Feels ok. Breathing better.   1. Dyspnea   2. Chest heaviness   3. Pulmonary embolism     Past Medical History  Diagnosis Date  . COPD (chronic obstructive pulmonary disease)   . Pulmonary embolus   . Hypertension   . Bipolar 1 disorder   . Neuroleptic-induced parkinsonism   . Anxiety   . Hyperlipidemia   . Falls infrequently    Current Facility-Administered Medications  Medication Dose Route Frequency Provider Last Rate Last Dose  . acetaminophen (TYLENOL) tablet 650 mg  650 mg Oral Q6H PRN Anderson Malta, DO       Or  . acetaminophen (TYLENOL) suppository 650 mg  650 mg Rectal Q6H PRN Anderson Malta, DO      . albuterol (PROVENTIL HFA;VENTOLIN HFA) 108 (90 BASE) MCG/ACT inhaler 2 puff  2 puff Inhalation Q6H PRN Anderson Malta, DO      . albuterol (PROVENTIL) (5 MG/ML) 0.5% nebulizer solution 2.5 mg  2.5 mg Nebulization Q2H PRN Anderson Malta, DO      . alum & mag hydroxide-simeth (MAALOX/MYLANTA) 200-200-20 MG/5ML suspension 30 mL  30 mL Oral Q6H PRN Anderson Malta, DO      . divalproex (DEPAKOTE) DR tablet 250 mg  250 mg Oral QHS Anderson Malta, DO   250 mg at 11/02/11 2131  . docusate sodium (COLACE) capsule 100 mg  100 mg Oral BID Anderson Malta, DO   100 mg at 11/03/11 1000  . fenofibrate tablet 160 mg  160 mg Oral Daily Anderson Malta, DO   160 mg at 11/03/11 1000  . Fluticasone-Salmeterol (ADVAIR) 250-50 MCG/DOSE inhaler 1 puff  1 puff Inhalation BID Anderson Malta, DO   1 puff at 11/03/11 0944  . furosemide (LASIX) tablet 20 mg  20 mg Oral Daily Arlene Brickel, MD   20 mg at 11/03/11 1000  . levothyroxine (SYNTHROID, LEVOTHROID) tablet 75 mcg  75 mcg Oral Daily Anderson Malta, DO   75 mcg at 11/03/11 1000  . metoprolol tartrate (LOPRESSOR) tablet 25 mg  25 mg Oral BID Anderson Malta, DO   25 mg at 11/03/11 1000  . morphine 20 MG/ML concentrated solution 5-10 mg  5-10 mg Oral Q2H PRN Anderson Malta, DO   5 mg at 11/01/11 1509    . mulitivitamin with minerals tablet 1 tablet  1 tablet Oral Q24H Anderson Malta, DO   1 tablet at 11/02/11 1827  . OLANZapine (ZYPREXA) tablet 20 mg  20 mg Oral QHS Anderson Malta, DO   20 mg at 11/02/11 2130  . Oxcarbazepine (TRILEPTAL) tablet 300 mg  300 mg Oral BID Anderson Malta, DO   300 mg at 11/03/11 1000  . potassium chloride (K-DUR,KLOR-CON) CR tablet 10 mEq  10 mEq Oral Daily Stanley Lyness, MD   10 mEq at 11/03/11 1000  . promethazine (PHENERGAN) tablet 12.5 mg  12.5 mg Oral Q6H PRN Anderson Malta, DO       Or  . promethazine (PHENERGAN) injection 12.5 mg  12.5 mg Intravenous Q6H PRN Anderson Malta, DO      . tiotropium [facility]) inhalation capsule 18 mcg  18 mcg Inhalation Daily Anderson Malta, DO   18 mcg at 11/03/11 0944  . warfarin (COUMADIN) tablet 2.5 mg  2.5 mg Oral ONCE-1800 Christine Lanora Manis [place], PHARMD   2.5 mg at 11/02/11 1827  . warfarin (COUMADIN) tablet 2.5 mg  2.5 mg Oral ONCE-1800 Jenney Brester, MD       Allergies  Allergen Reactions  . Benzodiazepines Other (See Comments)  Causes aggression, paradoxical reaction   Active Problems:  Bipolar disorder  Pulmonary embolism  Shortness of breath dyspnea  Anxiety state  COPD (chronic obstructive pulmonary disease)  Tremor  Dementia in Parkinson's disease   Vital signs in last 24 hours: Temp:  [97.6 F (36.4 C)-98.4 F (36.9 C)] 98.4 F (36.9 C) (01/19 1430) Pulse Rate:  [63-82] 78  (01/19 1430) Resp:  [18-20] 20  (01/19 1430) BP: (106-150)/(62-93) 143/93 mmHg (01/19 1430) SpO2:  [93 %-95 %] 93 % (01/19 1430) Weight:  [101.061 kg (222 lb 12.8 oz)] 101.061 kg (222 lb 12.8 oz) (01/19 0430) Weight change:  Last BM Date: 11/02/11  Intake/Output from previous day: 01/18 0701 - 01/19 0700 In: 1560 [P.O.:1560] Out: 625 [Urine:625] Intake/Output this shift: Total I/O In: 480 [P.O.:480] Out: -   Lab Results:  Basename 11/03/11 0458 11/02/11 0430  WBC 7.3 6.8  HGB 13.5 13.1  HCT  40.7 39.8  PLT 356 350   BMET  Basename 11/03/11 0458 11/02/11 0430  NA 136 135  K 4.0 4.6  CL 101 103  CO2 24 22  GLUCOSE 99 101*  BUN 16 14  CREATININE 1.13 1.07  CALCIUM 8.9 8.5    Studies/Results: No results found.  Medications: I have reviewed the patient's current medications.   Physical exam GENERAL- alert HEAD- normal atraumatic, no neck masses, normal thyroid, no jvd RESPIRATORY- appears well, vitals normal, no respiratory distress, acyanotic, normal RR, ear and throat exam is normal, neck free of mass or lymphadenopathy, chest clear, no wheezing, crepitations, rhonchi, normal symmetric air entry CVS- regular rate and rhythm, S1, S2 normal, no murmur, click, rub or gallop ABDOMEN- abdomen is soft without significant tenderness, masses, organomegaly or guarding NEURO- no acute changes. EXTREMITIES- right leg slightly more swollen compared to left.  Plan  1. Bipolar disorder/Anxiety state  - seems stable. Continue home meds.  2. Pulmonary embolism/ Shortness of breath dyspnea/COPD (chronic obstructive pulmonary disease)- less dyspneic, comfortable on room air. Continue current mx. Requests spiriva at d/c. 3. Tremor/ Dementia in Parkinson's disease- stable. Continue current mx.  4. Disposition-  back to facility Monday.        Derek Hicks 11/03/2011 3:58 PM Pager: [phone].

## 2011-11-03 NOTE — Progress Notes (Signed)
ANTICOAGULATION CONSULT NOTE  Pharmacy Consult for Coumadin Indication: PE (new)   Allergies  Allergen Reactions  . Benzodiazepines Other (See Comments)    Causes aggression, paradoxical reaction    Patient Measurements: Height: 5\' 8"  (172.7 cm) Weight: 222 lb 12.8 oz (101.061 kg) IBW/kg (Calculated) : 68.4    Vital Signs: Temp: 97.9 F (36.6 C) (01/19 0430) Temp src: Oral (01/19 0430) BP: 106/62 mmHg (01/19 0430) Pulse Rate: 63  (01/19 0430)  Labs:  Basename 11/03/11 0458 11/02/11 0430 10/31/11 2027 10/31/11 2026  HGB 13.5 13.1 -- --  HCT 40.7 39.8 -- 41.4  PLT 356 350 -- 400  APTT -- -- -- 41*  LABPROT 26.5* 25.0* -- 26.9*  INR 2.39* 2.22* -- 2.44*  HEPARINUNFRC -- -- -- --  CREATININE 1.13 1.07 -- 1.09  CKTOTAL -- -- 120 --  CKMB -- -- 3.7 --  TROPONINI -- -- <0.30 --   Estimated Creatinine Clearance: 80.1 ml/min (by C-G formula based on Cr of 1.13).  Medical History: Past Medical History  Diagnosis Date  . COPD (chronic obstructive pulmonary disease)   . Pulmonary embolus   . Hypertension   . Bipolar 1 disorder   . Neuroleptic-induced parkinsonism   . Anxiety   . Hyperlipidemia   . Falls infrequently     Medications:  Scheduled:     . divalproex  250 mg Oral QHS  . docusate sodium  100 mg Oral BID  . fenofibrate  160 mg Oral Daily  . Fluticasone-Salmeterol  1 puff Inhalation BID  . furosemide  20 mg Oral Daily  . levothyroxine  75 mcg Oral Daily  . metoprolol tartrate  25 mg Oral BID  . mulitivitamin with minerals  1 tablet Oral Q24H  . OLANZapine  20 mg Oral QHS  . Oxcarbazepine  300 mg Oral BID  . potassium chloride  10 mEq Oral Daily  . tiotropium  18 mcg Inhalation Daily  . warfarin  2.5 mg Oral ONCE-1800    Assessment:  61 yo discharged from  [facility] on 1/14 on coumadin 2.5mg  PO daily for large bilateral PEs.  He has returned to the hospital on 1/16 with further symptoms   Therapeutic INR  Goal of Therapy:  INR 2-3   Plan:    Repeat 2.5mg  today  Follow up PT/INR  daily   Hessie Knows, PharmD, BCPS 11/03/2011 8:05 AM [phone]

## 2011-11-04 MED ORDER — METOPROLOL TARTRATE 50 MG PO TABS
50.0000 mg | ORAL_TABLET | Freq: Two times a day (BID) | ORAL | Status: DC
  Administered 2011-11-04 – 2011-11-05 (×2): 50 mg via ORAL
  Filled 2011-11-04 (×3): qty 1

## 2011-11-04 MED ORDER — WARFARIN SODIUM 3 MG PO TABS
3.0000 mg | ORAL_TABLET | Freq: Once | ORAL | Status: AC
  Administered 2011-11-04: 3 mg via ORAL
  Filled 2011-11-04: qty 1

## 2011-11-04 NOTE — Progress Notes (Signed)
SUBJECTIVE Feels ok. No complaints.   1. Dyspnea   2. Chest heaviness   3. Pulmonary embolism     Past Medical History  Diagnosis Date  . COPD (chronic obstructive pulmonary disease)   . Pulmonary embolus   . Hypertension   . Bipolar 1 disorder   . Neuroleptic-induced parkinsonism   . Anxiety   . Hyperlipidemia   . Falls infrequently    Current Facility-Administered Medications  Medication Dose Route Frequency Provider Last Rate Last Dose  . acetaminophen (TYLENOL) tablet 650 mg  650 mg Oral Q6H PRN Anderson Malta, DO       Or  . acetaminophen (TYLENOL) suppository 650 mg  650 mg Rectal Q6H PRN Anderson Malta, DO      . albuterol (PROVENTIL HFA;VENTOLIN HFA) 108 (90 BASE) MCG/ACT inhaler 2 puff  2 puff Inhalation Q6H PRN Anderson Malta, DO      . albuterol (PROVENTIL) (5 MG/ML) 0.5% nebulizer solution 2.5 mg  2.5 mg Nebulization Q2H PRN Anderson Malta, DO      . alum & mag hydroxide-simeth (MAALOX/MYLANTA) 200-200-20 MG/5ML suspension 30 mL  30 mL Oral Q6H PRN Anderson Malta, DO      . divalproex (DEPAKOTE) DR tablet 250 mg  250 mg Oral QHS Anderson Malta, DO   250 mg at 11/03/11 2150  . docusate sodium (COLACE) capsule 100 mg  100 mg Oral BID Anderson Malta, DO   100 mg at 11/04/11 0951  . fenofibrate tablet 160 mg  160 mg Oral Daily Anderson Malta, DO   160 mg at 11/04/11 0951  . Fluticasone-Salmeterol (ADVAIR) 250-50 MCG/DOSE inhaler 1 puff  1 puff Inhalation BID Anderson Malta, DO   1 puff at 11/04/11 0823  . furosemide (LASIX) tablet 20 mg  20 mg Oral Daily Haidynn Almendarez, MD   20 mg at 11/04/11 0951  . levothyroxine (SYNTHROID, LEVOTHROID) tablet 75 mcg  75 mcg Oral Daily Anderson Malta, DO   75 mcg at 11/04/11 0951  . metoprolol (LOPRESSOR) tablet 50 mg  50 mg Oral BID Jodee Wagenaar, MD      . morphine 20 MG/ML concentrated solution 5-10 mg  5-10 mg Oral Q2H PRN Anderson Malta, DO   5 mg at 11/01/11 1509  . mulitivitamin with minerals tablet  1 tablet  1 tablet Oral Q24H Anderson Malta, DO   1 tablet at 11/03/11 1815  . OLANZapine (ZYPREXA) tablet 20 mg  20 mg Oral QHS Anderson Malta, DO   20 mg at 11/03/11 2149  . Oxcarbazepine (TRILEPTAL) tablet 300 mg  300 mg Oral BID Anderson Malta, DO   300 mg at 11/04/11 0951  . potassium chloride (K-DUR,KLOR-CON) CR tablet 10 mEq  10 mEq Oral Daily Ilhan Madan, MD   10 mEq at 11/04/11 0951  . promethazine (PHENERGAN) tablet 12.5 mg  12.5 mg Oral Q6H PRN Anderson Malta, DO       Or  . promethazine (PHENERGAN) injection 12.5 mg  12.5 mg Intravenous Q6H PRN Anderson Malta, DO      . tiotropium [facility]) inhalation capsule 18 mcg  18 mcg Inhalation Daily Anderson Malta, DO   18 mcg at 11/04/11 1610  . warfarin (COUMADIN) tablet 2.5 mg  2.5 mg Oral ONCE-1800 Kendelle Schweers, MD   2.5 mg at 11/03/11 1815  . warfarin (COUMADIN) tablet 3 mg  3 mg Oral ONCE-1800 Coady Train, MD      . DISCONTD: metoprolol tartrate (LOPRESSOR) tablet 25 mg  25 mg Oral BID Anderson Malta, DO  25 mg at 11/04/11 0951   Allergies  Allergen Reactions  . Benzodiazepines Other (See Comments)    Causes aggression, paradoxical reaction   Active Problems:  Bipolar disorder  Pulmonary embolism  Shortness of breath dyspnea  Anxiety state  COPD (chronic obstructive pulmonary disease)  Tremor  Dementia in Parkinson's disease   Vital signs in last 24 hours: Temp:  [97.3 F (36.3 C)-97.8 F (36.6 C)] 97.8 F (36.6 C) (01/20 1300) Pulse Rate:  [62-85] 72  (01/20 1300) Resp:  [16-19] 16  (01/20 1300) BP: (102-152)/(66-92) 152/92 mmHg (01/20 1300) SpO2:  [90 %-96 %] 96 % (01/20 1300) Weight:  [102.694 kg (226 lb 6.4 oz)] 102.694 kg (226 lb 6.4 oz) (01/20 0440) Weight change: 1.633 kg (3 lb 9.6 oz) Last BM Date: 11/03/11  Intake/Output from previous day: 01/19 0701 - 01/20 0700 In: 480 [P.O.:480] Out: 400 [Urine:400] Intake/Output this shift: Total I/O In: 480 [P.O.:480] Out: 550  [Urine:550]  Lab Results:  Basename 11/03/11 0458 11/02/11 0430  WBC 7.3 6.8  HGB 13.5 13.1  HCT 40.7 39.8  PLT 356 350   BMET  Basename 11/03/11 0458 11/02/11 0430  NA 136 135  K 4.0 4.6  CL 101 103  CO2 24 22  GLUCOSE 99 101*  BUN 16 14  CREATININE 1.13 1.07  CALCIUM 8.9 8.5    Studies/Results: No results found.  Medications: I have reviewed the patient's current medications.   Physical exam GENERAL- alert  HEAD- normal atraumatic, no neck masses, normal thyroid, no jvd  RESPIRATORY- appears well, vitals normal, no respiratory distress, acyanotic, normal RR, ear and throat exam is normal, neck free of mass or lymphadenopathy, chest clear, no wheezing, crepitations, rhonchi, normal symmetric air entry  CVS- regular rate and rhythm, S1, S2 normal, no murmur, click, rub or gallop  ABDOMEN- abdomen is soft without significant tenderness, masses, organomegaly or guarding  NEURO- no acute changes.  EXTREMITIES- right leg slightly more swollen compared to left.  Plan  1. Bipolar disorder/Anxiety state  - stable. Continue home meds.  2. Pulmonary embolism/ Shortness of breath dyspnea/COPD (chronic obstructive pulmonary disease)- less dyspneic, comfortable on room air. Continue current mx.  spiriva at d/c.  3. Tremor/ Dementia in Parkinson's disease- stable. Continue current mx.  4. Disposition- back to facility Monday.           Safiyah Cisney 11/04/2011 4:07 PM Pager: [phone].

## 2011-11-04 NOTE — Progress Notes (Signed)
ANTICOAGULATION CONSULT NOTE  Pharmacy Consult for Coumadin Indication: PE (new)   Allergies  Allergen Reactions  . Benzodiazepines Other (See Comments)    Causes aggression, paradoxical reaction    Patient Measurements: Height: 5\' 8"  (172.7 cm) Weight: 226 lb 6.4 oz (102.694 kg) IBW/kg (Calculated) : 68.4    Vital Signs: Temp: 97.3 F (36.3 C) (01/20 0440) Temp src: Oral (01/20 0440) BP: 102/66 mmHg (01/20 0440) Pulse Rate: 62  (01/20 0440)  Labs:  Basename 11/04/11 0525 11/03/11 0458 11/02/11 0430  HGB -- 13.5 13.1  HCT -- 40.7 39.8  PLT -- 356 350  APTT -- -- --  LABPROT 24.7* 26.5* 25.0*  INR 2.19* 2.39* 2.22*  HEPARINUNFRC -- -- --  CREATININE -- 1.13 1.07  CKTOTAL -- -- --  CKMB -- -- --  TROPONINI -- -- --   Estimated Creatinine Clearance: 80.7 ml/min (by C-G formula based on Cr of 1.13).  Medical History: Past Medical History  Diagnosis Date  . COPD (chronic obstructive pulmonary disease)   . Pulmonary embolus   . Hypertension   . Bipolar 1 disorder   . Neuroleptic-induced parkinsonism   . Anxiety   . Hyperlipidemia   . Falls infrequently     Medications:  Scheduled:     . divalproex  250 mg Oral QHS  . docusate sodium  100 mg Oral BID  . fenofibrate  160 mg Oral Daily  . Fluticasone-Salmeterol  1 puff Inhalation BID  . furosemide  20 mg Oral Daily  . levothyroxine  75 mcg Oral Daily  . metoprolol tartrate  25 mg Oral BID  . mulitivitamin with minerals  1 tablet Oral Q24H  . OLANZapine  20 mg Oral QHS  . Oxcarbazepine  300 mg Oral BID  . potassium chloride  10 mEq Oral Daily  . tiotropium  18 mcg Inhalation Daily  . warfarin  2.5 mg Oral ONCE-1800    Assessment:  61 yo discharged from  [facility] on 1/14 on coumadin 2.5mg  PO daily for large bilateral PEs.  He has returned to the hospital on 1/16 with further symptoms   Therapeutic INR but dropping some 2.39 to 2.19  Goal of Therapy:  INR 2-3   Plan:   Increase dose slightly to  3mg  today  Follow up PT/INR  daily   Hessie Knows, PharmD, BCPS 11/04/2011 10:59 AM [phone]

## 2011-11-05 LAB — PROTIME-INR: INR: 2.07 — ABNORMAL HIGH (ref 0.00–1.49)

## 2011-11-05 MED ORDER — POTASSIUM CHLORIDE CRYS ER 10 MEQ PO TBCR: 10.0000 meq | EXTENDED_RELEASE_TABLET | Freq: Every day | ORAL | Status: DC

## 2011-11-05 MED ORDER — ADULT MULTIVITAMIN W/MINERALS CH: 1.0000 | ORAL_TABLET | ORAL | Status: DC

## 2011-11-05 MED ORDER — MORPHINE SULFATE (CONCENTRATE) 20 MG/ML PO SOLN: 5.0000 mg | ORAL | Status: AC | PRN

## 2011-11-05 MED ORDER — FUROSEMIDE 20 MG PO TABS: 20.0000 mg | ORAL_TABLET | Freq: Every day | ORAL | Status: DC

## 2011-11-05 MED ORDER — TIOTROPIUM BROMIDE MONOHYDRATE 18 MCG IN CAPS: 18.0000 ug | ORAL_CAPSULE | Freq: Every day | RESPIRATORY_TRACT | Status: DC

## 2011-11-05 MED ORDER — METOPROLOL TARTRATE 50 MG PO TABS: 50.0000 mg | ORAL_TABLET | Freq: Two times a day (BID) | ORAL | Status: DC

## 2011-11-05 NOTE — Discharge Summary (Signed)
DISCHARGE SUMMARY  Derek Hicks  Derek#: [id number]  DOB:15-Jan-1951  Date of Admission: 10/31/2011 Date of Discharge: 11/05/2011  Attending Physician:Savera Donson  Patient's PCP:No primary provider on file.  Consults: none.  Discharge Diagnoses: .Shortness of breath dyspnea Pulmonary Embolism Bipolar Disorder. Htn, malignant. Grade 1 diastolic dysfunction, EF 50-55% Parkinson's disease with dementia.    Current Discharge Medication List    START taking these medications   Details  furosemide (LASIX) 20 MG tablet Take 1 tablet (20 mg total) by mouth daily. Qty: 30 tablet, Refills: 0    morphine 20 MG/ML concentrated solution Take 0.25-0.5 mLs (5-10 mg total) by mouth every 2 (two) hours as needed (air hunger/dyspnea ). Qty: 15 mL, Refills: 0    Multiple Vitamin (MULITIVITAMIN WITH MINERALS) TABS Take 1 tablet by mouth daily. Qty: 30 tablet, Refills: 0    potassium chloride (K-DUR,KLOR-CON) 10 MEQ tablet Take 1 tablet (10 mEq total) by mouth daily. Qty: 30 tablet, Refills: 0    tiotropium (SPIRIVA) 18 MCG inhalation capsule Place 1 capsule (18 mcg total) into inhaler and inhale daily. Qty: 30 capsule, Refills: 0      CONTINUE these medications which have CHANGED   Details  metoprolol (LOPRESSOR) 50 MG tablet Take 1 tablet (50 mg total) by mouth 2 (two) times daily. Qty: 60 tablet, Refills: 0      CONTINUE these medications which have NOT CHANGED   Details  albuterol (PROVENTIL HFA;VENTOLIN HFA) 108 (90 BASE) MCG/ACT inhaler Inhale 2 puffs into the lungs every 6 (six) hours as needed.    divalproex (DEPAKOTE) 250 MG DR tablet Take 250 mg by mouth at bedtime. Take 3-4 tablets by mouth everyday    fenofibrate (TRICOR) 145 MG tablet Take 145 mg by mouth daily.    Fluticasone-Salmeterol (ADVAIR) 250-50 MCG/DOSE AEPB Inhale 1 puff into the lungs 2 (two) times daily.    levothyroxine (SYNTHROID, LEVOTHROID) 75 MCG tablet Take 75 mcg by mouth daily.    OLANZapine  (ZYPREXA) 20 MG tablet Take 20 mg by mouth at bedtime.    Oxcarbazepine (TRILEPTAL) 300 MG tablet Take 300 mg by mouth 2 (two) times daily.    warfarin (COUMADIN) 2.5 MG tablet Take 2.5 mg by mouth daily.      STOP taking these medications     hydrochlorothiazide (HYDRODIURIL) 25 MG tablet          Hospital Course: Derek Hicks is a pleasant gentleman admitted with slow to resolve shortness of breath after d/c from [facility], where he was managed for acute PE. He remained quite dyspnoiec and felt that he was prematurely discharged. His INR remained in therapeutic range. A 2decho suggested some grade 1 diastolic dysfunction, prompting lasix. The shortness of breath responded to symptomatic efforts, including morphine prn/O2/lasix/bronchodilators. He is now comfortable on room air. He will d/c to ALF, and should have the INR checked later this week. It has been mostly stable during the hospital stay.   Day of Discharge BP 120/76  Pulse 54  Temp(Src) 97.5 F (36.4 C) (Axillary)  Resp 17  Ht 5\' 8"  (1.727 m)  Wt 102.8 kg (226 lb 10.1 oz)  BMI 34.46 kg/m2  SpO2 96%  Physical Exam: At baseline.  Results for orders placed during the hospital encounter of 10/31/11 (from the past 24 hour(s))  PROTIME-INR     Status: Abnormal   Collection Time   11/05/11  4:44 AM      Component Value Range   Prothrombin Time 23.7 (*) 11.6 -  15.2 (seconds)   INR 2.07 (*) 0.00 - 1.49     Disposition: to ALF.   Follow-up Appts: Discharge Orders    Future Orders Please Complete By Expires   Diet - low sodium heart healthy      Increase activity slowly           Tests Needing Follow-up: CBC/INR.  Time spent in discharge (includes decision making & examination of pt): 35 minutes  Signed: Daeshaun Specht 11/05/2011, 8:18 AM

## 2011-11-05 NOTE — Progress Notes (Signed)
CSW faxed discharge clinicals to [facility] at [facility] and confirmed they were received. CSW has spoken with the pt and he reports he spoke with his wife and she knows he is discharged. CSW has complied documents to transport with the pt. The facility will provide transportation for the pt. CSW has spoken with pts RN Baxter Hire and she has confirmed the pt is ready for d/c. CSW signing off. Patrice Paradise, LCSWA 11/05/2011 1:11 PM [phone]

## 2012-04-30 ENCOUNTER — Emergency Department (HOSPITAL_COMMUNITY): Payer: Medicare Other

## 2012-04-30 ENCOUNTER — Encounter (HOSPITAL_COMMUNITY): Payer: Self-pay | Admitting: *Deleted

## 2012-04-30 ENCOUNTER — Emergency Department (HOSPITAL_COMMUNITY)
Admission: EM | Admit: 2012-04-30 | Discharge: 2012-04-30 | Disposition: A | Discharge status: Skilled Nursing Facility | Payer: Medicare Other | Attending: Emergency Medicine | Admitting: Emergency Medicine

## 2012-04-30 DIAGNOSIS — I10 Essential (primary) hypertension: Secondary | ICD-10-CM | POA: Insufficient documentation

## 2012-04-30 DIAGNOSIS — Y9210 Unspecified residential institution as the place of occurrence of the external cause: Secondary | ICD-10-CM | POA: Insufficient documentation

## 2012-04-30 DIAGNOSIS — G319 Degenerative disease of nervous system, unspecified: Secondary | ICD-10-CM | POA: Insufficient documentation

## 2012-04-30 DIAGNOSIS — M25529 Pain in unspecified elbow: Secondary | ICD-10-CM | POA: Insufficient documentation

## 2012-04-30 DIAGNOSIS — W19XXXA Unspecified fall, initial encounter: Secondary | ICD-10-CM

## 2012-04-30 DIAGNOSIS — Z79899 Other long term (current) drug therapy: Secondary | ICD-10-CM | POA: Insufficient documentation

## 2012-04-30 DIAGNOSIS — J449 Chronic obstructive pulmonary disease, unspecified: Secondary | ICD-10-CM | POA: Insufficient documentation

## 2012-04-30 DIAGNOSIS — W07XXXA Fall from chair, initial encounter: Secondary | ICD-10-CM | POA: Insufficient documentation

## 2012-04-30 DIAGNOSIS — E785 Hyperlipidemia, unspecified: Secondary | ICD-10-CM | POA: Insufficient documentation

## 2012-04-30 DIAGNOSIS — J4489 Other specified chronic obstructive pulmonary disease: Secondary | ICD-10-CM | POA: Insufficient documentation

## 2012-04-30 DIAGNOSIS — F319 Bipolar disorder, unspecified: Secondary | ICD-10-CM | POA: Insufficient documentation

## 2012-04-30 NOTE — ED Provider Notes (Signed)
History     CSN: [id number]  Arrival date & time 04/30/12  4098   First MD Initiated Contact with Patient 04/30/12 2039      Chief Complaint  Patient presents with  . Fall    (Consider location/radiation/quality/duration/timing/severity/associated sxs/prior treatment) HPI  Pt while at nursing home, [facility], was standing on a chair to close the air vent when his foot slipped and he fell off of his chair, striking his head and left elbow to the ground. He is on coumadin for blood clots. He denies having any head pain but admits that his elbow is hurting him. PT is in NAD, alert and oriented x 3, VSS.  Past Medical History  Diagnosis Date  . COPD (chronic obstructive pulmonary disease)   . Pulmonary embolus   . Hypertension   . Bipolar 1 disorder   . Neuroleptic-induced Parkinsonism   . Anxiety   . Hyperlipidemia   . Falls infrequently     History reviewed. No pertinent past surgical history.  No family history on file.  History  Substance Use Topics  . Smoking status: Never Smoker   . Smokeless tobacco: Not on file  . Alcohol Use: No      Review of Systems   HEENT: denies blurry vision or change in hearing PULMONARY: Denies difficulty breathing and SOB CARDIAC: denies chest pain or heart palpitations MUSCULOSKELETAL:  denies being unable to ambulate ABDOMEN AL: denies abdominal pain GU: denies loss of bowel or urinary control NEURO: denies numbness and tingling in extremities SKIN: no new rashes PSYCH: patient denies anxiety or depression. NECK: Pt denies having neck pain     Allergies  Benzodiazepines  Home Medications   Current Outpatient Rx  Name Route Sig Dispense Refill  . ALBUTEROL SULFATE HFA 108 (90 BASE) MCG/ACT IN AERS Inhalation Inhale 2 puffs into the lungs every 6 (six) hours as needed. For shortness of breath    . FENOFIBRATE 145 MG PO TABS Oral Take 145 mg by mouth daily.    Marland Kitchen FLUTICASONE-SALMETEROL 250-50 MCG/DOSE IN AEPB  Inhalation Inhale 1 puff into the lungs 2 (two) times daily.    . FUROSEMIDE 20 MG PO TABS Oral Take 1 tablet (20 mg total) by mouth daily. 30 tablet 0  . LEVOTHYROXINE SODIUM 75 MCG PO TABS Oral Take 75 mcg by mouth daily.    Marland Kitchen METOPROLOL TARTRATE 50 MG PO TABS Oral Take 1 tablet (50 mg total) by mouth 2 (two) times daily. 60 tablet 0  . ADULT MULTIVITAMIN W/MINERALS CH Oral Take 1 tablet by mouth daily. 30 tablet 0  . OLANZAPINE 20 MG PO TABS Oral Take 20 mg by mouth at bedtime.    Marland Kitchen OXCARBAZEPINE 300 MG PO TABS Oral Take 300 mg by mouth 2 (two) times daily.    Marland Kitchen POTASSIUM CHLORIDE CRYS ER 10 MEQ PO TBCR Oral Take 1 tablet (10 mEq total) by mouth daily. 30 tablet 0  . TIOTROPIUM BROMIDE MONOHYDRATE 18 MCG IN CAPS Inhalation Place 1 capsule (18 mcg total) into inhaler and inhale daily. 30 capsule 0  . WARFARIN SODIUM 2.5 MG PO TABS Oral Take 2.5 mg by mouth daily.      BP 175/100  Pulse 79  Temp 97.9 F (36.6 C)  Resp 22  SpO2 96%  Physical Exam  Nursing note and vitals reviewed. Constitutional: He appears well-developed and well-nourished. No distress.  HENT:  Head: Normocephalic and atraumatic. Head is without raccoon's eyes, without Battle's sign, without abrasion, without contusion  and without laceration.  Eyes: Pupils are equal, round, and reactive to light.  Neck: Normal range of motion. Neck supple.  Cardiovascular: Normal rate and regular rhythm.   Pulmonary/Chest: Effort normal.  Abdominal: Soft.  Musculoskeletal:       Left elbow: Normal. He exhibits normal range of motion, no swelling, no effusion, no deformity and no laceration.  Neurological: He is alert.  Skin: Skin is warm and dry.    ED Course  Procedures (including critical care time)  Labs Reviewed - No data to display Dg Elbow Complete Left  04/30/2012  *RADIOLOGY REPORT*  Clinical Data: Pain post fall  LEFT ELBOW - COMPLETE 3+ VIEW  Comparison: None.  Findings: No true lateral is submitted.  No definite  effusion. Negative for fracture, dislocation, or other acute abnormality. Normal alignment and mineralization. No significant degenerative change.  Regional soft tissues unremarkable.  IMPRESSION:  Negative  Original Report Authenticated By: Osa Craver, M.D.   Ct Head Wo Contrast  04/30/2012  *RADIOLOGY REPORT*  Clinical Data: Blunt trauma post fall  CT HEAD WITHOUT CONTRAST  Technique:  Contiguous axial images were obtained from the base of the skull through the vertex without contrast.  Comparison: None.  Findings: Atherosclerotic and physiologic intracranial calcifications.  Mild atrophy with resultant prominence of ventricles and sulci. There is no evidence of acute intracranial hemorrhage, brain edema, mass lesion, acute infarction,   mass effect, or midline shift. Acute infarct may be inapparent on noncontrast CT.  No other intra-axial abnormalities are seen.   No abnormal extra-axial fluid collections or masses are identified. No significant calvarial abnormality.  IMPRESSION: 1. Negative for bleed or other acute intracranial process. 2.  Diffuse parenchymal atrophy.  Original Report Authenticated By: Thora Lance III, M.D.     1. Fall       MDM  Pt images normal. He is feeling fine and is asking to go back to his facility. I re-evaluated the patient before DC and he continues to feel asymptomatic.  Pt has been advised of the symptoms that warrant their return to the ED. Patient has voiced understanding and has agreed to follow-up with the PCP or specialist.         Dorthula Matas, PA 04/30/12 2208

## 2012-04-30 NOTE — ED Notes (Signed)
Pt reports he was standing up in a chair when he fell. Denies LOC. Hit back of head and elbow. Pain in elbow 4/10. Pt on blood thinners. Denies headache.

## 2012-04-30 NOTE — ED Notes (Signed)
[facility] called for Transport 

## 2012-04-30 NOTE — ED Notes (Signed)
Pt states he has a hx of falling a lot, that is why he lives at [facility].  Pt states he DID hit his head when he fell, but denies any pain.  Pt states only pain he feels right now is in his elbow.  Pt was found sitting in chair and was not placed on spine board or given c-collar by ems.

## 2012-04-30 NOTE — ED Notes (Signed)
Pt on coumadin brought from [facility].  States he was standing on a dining room chair, lost balance, fell backwards.  Denies any pain.  No loc and did not hit head.  Neuro intact per ems.

## 2012-05-01 NOTE — ED Provider Notes (Signed)
Medical screening examination/treatment/procedure(s) were performed by non-physician practitioner and as supervising physician I was immediately available for consultation/collaboration.   Loren Racer, MD 05/01/12 [phone]

## 2012-07-15 ENCOUNTER — Emergency Department (HOSPITAL_COMMUNITY)
Admission: EM | Admit: 2012-07-15 | Discharge: 2012-07-16 | Disposition: A | Discharge status: Other Acute Inpatient Hospital | Payer: Medicare Other | Attending: Emergency Medicine | Admitting: Emergency Medicine

## 2012-07-15 ENCOUNTER — Encounter (HOSPITAL_COMMUNITY): Payer: Self-pay | Admitting: *Deleted

## 2012-07-15 DIAGNOSIS — J4489 Other specified chronic obstructive pulmonary disease: Secondary | ICD-10-CM | POA: Insufficient documentation

## 2012-07-15 DIAGNOSIS — F411 Generalized anxiety disorder: Secondary | ICD-10-CM

## 2012-07-15 DIAGNOSIS — I10 Essential (primary) hypertension: Secondary | ICD-10-CM

## 2012-07-15 DIAGNOSIS — J449 Chronic obstructive pulmonary disease, unspecified: Secondary | ICD-10-CM | POA: Insufficient documentation

## 2012-07-15 DIAGNOSIS — Z7901 Long term (current) use of anticoagulants: Secondary | ICD-10-CM | POA: Insufficient documentation

## 2012-07-15 DIAGNOSIS — I2699 Other pulmonary embolism without acute cor pulmonale: Secondary | ICD-10-CM

## 2012-07-15 DIAGNOSIS — T43591A Poisoning by other antipsychotics and neuroleptics, accidental (unintentional), initial encounter: Secondary | ICD-10-CM

## 2012-07-15 DIAGNOSIS — F419 Anxiety disorder, unspecified: Secondary | ICD-10-CM

## 2012-07-15 DIAGNOSIS — G2111 Neuroleptic induced parkinsonism: Secondary | ICD-10-CM | POA: Insufficient documentation

## 2012-07-15 DIAGNOSIS — Z79899 Other long term (current) drug therapy: Secondary | ICD-10-CM | POA: Insufficient documentation

## 2012-07-15 DIAGNOSIS — Z9181 History of falling: Secondary | ICD-10-CM | POA: Insufficient documentation

## 2012-07-15 DIAGNOSIS — T43505A Adverse effect of unspecified antipsychotics and neuroleptics, initial encounter: Secondary | ICD-10-CM | POA: Insufficient documentation

## 2012-07-15 DIAGNOSIS — E785 Hyperlipidemia, unspecified: Secondary | ICD-10-CM | POA: Insufficient documentation

## 2012-07-15 DIAGNOSIS — F319 Bipolar disorder, unspecified: Secondary | ICD-10-CM | POA: Insufficient documentation

## 2012-07-15 DIAGNOSIS — F0280 Dementia in other diseases classified elsewhere without behavioral disturbance: Secondary | ICD-10-CM | POA: Insufficient documentation

## 2012-07-15 DIAGNOSIS — G219 Secondary parkinsonism, unspecified: Secondary | ICD-10-CM

## 2012-07-15 HISTORY — DX: Long term (current) use of anticoagulants: Z79.01

## 2012-07-15 HISTORY — DX: Hypothyroidism, unspecified: E03.9

## 2012-07-15 LAB — COMPREHENSIVE METABOLIC PANEL
ALT: 17 U/L (ref 0–53)
AST: 17 U/L (ref 0–37)
CO2: 24 mEq/L (ref 19–32)
Chloride: 94 mEq/L — ABNORMAL LOW (ref 96–112)
GFR calc non Af Amer: 80 mL/min — ABNORMAL LOW (ref >90)
Sodium: 130 mEq/L — ABNORMAL LOW (ref 135–145)
Total Bilirubin: 0.3 mg/dL (ref 0.3–1.2)

## 2012-07-15 LAB — RAPID URINE DRUG SCREEN, HOSP PERFORMED
Barbiturates: NOT DETECTED
Opiates: NOT DETECTED
Tetrahydrocannabinol: NOT DETECTED

## 2012-07-15 LAB — CBC WITH DIFFERENTIAL/PLATELET
Basophils Relative: 1 % (ref 0–1)
Eosinophils Absolute: 0.2 10*3/uL (ref 0.0–0.7)
Eosinophils Relative: 2 % (ref 0–5)
Hemoglobin: 15.2 g/dL (ref 13.0–17.0)
MCH: 29.1 pg (ref 26.0–34.0)
MCHC: 34.4 g/dL (ref 30.0–36.0)
Monocytes Relative: 10 % (ref 3–12)
Neutrophils Relative %: 60 % (ref 43–77)
Platelets: 298 10*3/uL (ref 150–400)

## 2012-07-15 LAB — PROTIME-INR
INR: 1.38 (ref 0.00–1.49)
Prothrombin Time: 16.6 seconds — ABNORMAL HIGH (ref 11.6–15.2)

## 2012-07-15 MED ORDER — LOSARTAN POTASSIUM 50 MG PO TABS
50.0000 mg | ORAL_TABLET | Freq: Every day | ORAL | Status: DC
  Administered 2012-07-15: 50 mg via ORAL
  Filled 2012-07-15: qty 1

## 2012-07-15 MED ORDER — LABETALOL HCL 100 MG PO TABS
100.0000 mg | ORAL_TABLET | Freq: Two times a day (BID) | ORAL | Status: DC
  Administered 2012-07-15: 100 mg via ORAL
  Filled 2012-07-15 (×3): qty 1

## 2012-07-15 MED ORDER — WARFARIN SODIUM 2.5 MG PO TABS
2.5000 mg | ORAL_TABLET | Freq: Every day | ORAL | Status: DC
  Administered 2012-07-15: 2.5 mg via ORAL
  Filled 2012-07-15: qty 1

## 2012-07-15 MED ORDER — TRAZODONE HCL 50 MG PO TABS
100.0000 mg | ORAL_TABLET | Freq: Every evening | ORAL | Status: DC | PRN
  Administered 2012-07-15: 100 mg via ORAL
  Filled 2012-07-15: qty 2

## 2012-07-15 MED ORDER — ALBUTEROL SULFATE HFA 108 (90 BASE) MCG/ACT IN AERS: 2.0000 | INHALATION_SPRAY | Freq: Four times a day (QID) | RESPIRATORY_TRACT | Status: DC | PRN

## 2012-07-15 MED ORDER — VITAMIN D3 25 MCG (1000 UNIT) PO TABS
2000.0000 [IU] | ORAL_TABLET | Freq: Every day | ORAL | Status: DC
  Administered 2012-07-15: 2000 [IU] via ORAL
  Filled 2012-07-15: qty 2

## 2012-07-15 MED ORDER — LOPERAMIDE HCL 2 MG PO CAPS: 2.0000 mg | ORAL_CAPSULE | Freq: Four times a day (QID) | ORAL | Status: DC | PRN

## 2012-07-15 MED ORDER — ENOXAPARIN SODIUM 100 MG/ML ~~LOC~~ SOLN
100.0000 mg | Freq: Once | SUBCUTANEOUS | Status: AC
  Administered 2012-07-15: 100 mg via SUBCUTANEOUS
  Filled 2012-07-15: qty 1

## 2012-07-15 MED ORDER — LOPERAMIDE HCL 2 MG PO TABS: 2.0000 mg | ORAL_TABLET | Freq: Four times a day (QID) | ORAL | Status: DC | PRN

## 2012-07-15 MED ORDER — DIVALPROEX SODIUM ER 500 MG PO TB24
1500.0000 mg | ORAL_TABLET | Freq: Every day | ORAL | Status: DC
  Administered 2012-07-15: 1500 mg via ORAL
  Filled 2012-07-15: qty 3

## 2012-07-15 MED ORDER — WARFARIN SODIUM 1 MG PO TABS
1.0000 mg | ORAL_TABLET | ORAL | Status: DC
Start: 2012-07-18 — End: 2012-07-16

## 2012-07-15 MED ORDER — LEVOTHYROXINE SODIUM 75 MCG PO TABS
75.0000 ug | ORAL_TABLET | Freq: Every day | ORAL | Status: DC
  Administered 2012-07-15: 75 ug via ORAL
  Filled 2012-07-15 (×3): qty 1

## 2012-07-15 MED ORDER — WARFARIN - PHYSICIAN DOSING INPATIENT: Freq: Every day | Status: DC

## 2012-07-15 MED ORDER — LACTULOSE 10 GM/15ML PO SOLN
20.0000 g | Freq: Two times a day (BID) | ORAL | Status: DC
  Administered 2012-07-15 (×2): 20 g via ORAL
  Filled 2012-07-15 (×3): qty 30

## 2012-07-15 MED ORDER — SODIUM CHLORIDE 0.9 % IV SOLN
Freq: Once | INTRAVENOUS | Status: AC
  Administered 2012-07-15: 20:00:00 via INTRAVENOUS

## 2012-07-15 MED ORDER — BENZTROPINE MESYLATE 1 MG PO TABS
1.0000 mg | ORAL_TABLET | Freq: Two times a day (BID) | ORAL | Status: DC
  Administered 2012-07-15 (×2): 1 mg via ORAL
  Filled 2012-07-15 (×2): qty 1

## 2012-07-15 MED ORDER — LABETALOL HCL 5 MG/ML IV SOLN
20.0000 mg | Freq: Once | INTRAVENOUS | Status: AC
  Administered 2012-07-15: 20 mg via INTRAVENOUS
  Filled 2012-07-15: qty 4

## 2012-07-15 MED ORDER — OXCARBAZEPINE 300 MG PO TABS
300.0000 mg | ORAL_TABLET | Freq: Two times a day (BID) | ORAL | Status: DC
  Administered 2012-07-15 (×2): 300 mg via ORAL
  Filled 2012-07-15 (×4): qty 1

## 2012-07-15 MED ORDER — HYDROXYZINE PAMOATE 50 MG PO CAPS
50.0000 mg | ORAL_CAPSULE | Freq: Two times a day (BID) | ORAL | Status: DC | PRN
  Filled 2012-07-15: qty 1

## 2012-07-15 MED ORDER — POTASSIUM CHLORIDE CRYS ER 20 MEQ PO TBCR
10.0000 meq | EXTENDED_RELEASE_TABLET | Freq: Every day | ORAL | Status: DC
  Administered 2012-07-15: 10 meq via ORAL
  Filled 2012-07-15: qty 1

## 2012-07-15 MED ORDER — ACETAMINOPHEN 325 MG PO TABS
650.0000 mg | ORAL_TABLET | Freq: Once | ORAL | Status: AC
  Administered 2012-07-15: 650 mg via ORAL
  Filled 2012-07-15: qty 2

## 2012-07-15 MED ORDER — LOSARTAN POTASSIUM 50 MG PO TABS
50.0000 mg | ORAL_TABLET | Freq: Two times a day (BID) | ORAL | Status: DC
  Administered 2012-07-15: 50 mg via ORAL
  Filled 2012-07-15 (×3): qty 1

## 2012-07-15 MED ORDER — METOPROLOL TARTRATE 25 MG PO TABS
75.0000 mg | ORAL_TABLET | Freq: Two times a day (BID) | ORAL | Status: DC
  Administered 2012-07-15: 75 mg via ORAL
  Filled 2012-07-15: qty 3

## 2012-07-15 MED ORDER — FUROSEMIDE 20 MG PO TABS
20.0000 mg | ORAL_TABLET | Freq: Every day | ORAL | Status: DC
  Administered 2012-07-15: 20 mg via ORAL
  Filled 2012-07-15: qty 1

## 2012-07-15 MED ORDER — TIOTROPIUM BROMIDE MONOHYDRATE 18 MCG IN CAPS
18.0000 ug | ORAL_CAPSULE | Freq: Every day | RESPIRATORY_TRACT | Status: DC
  Administered 2012-07-15: 18 ug via RESPIRATORY_TRACT
  Filled 2012-07-15: qty 5

## 2012-07-15 MED ORDER — OLANZAPINE 10 MG PO TABS
20.0000 mg | ORAL_TABLET | Freq: Every day | ORAL | Status: DC
  Administered 2012-07-15: 20 mg via ORAL
  Filled 2012-07-15 (×2): qty 2

## 2012-07-15 NOTE — ED Notes (Signed)
Notified pharmacy that we need pt's 2200 meds.

## 2012-07-15 NOTE — ED Notes (Signed)
Patient's BP rechecked and is now in nirmal range. [facility] [place], RN) has accepted patient with current BP.

## 2012-07-15 NOTE — ED Notes (Signed)
ACT Team in pt's room

## 2012-07-15 NOTE — ED Notes (Signed)
Asked if pt could provide urine sample.  Stated, "No".  Will continue to check back with pt.

## 2012-07-15 NOTE — ED Notes (Signed)
EDP Ignacia Palma notified of pt's BP and that [facility] wouldn't accept pt.  Dr. Ignacia Palma st's he will come assess pt.

## 2012-07-15 NOTE — ED Notes (Signed)
Patient resting while watching tv with NAD. Breakfast tray at bedside.

## 2012-07-15 NOTE — ED Notes (Addendum)
H/o bipolar, here for "bipolar issues", clarifies "here for rage", "have had the feeling & plan of wanting to rip the heads off of people, but I know i cannot do that", "my zyprexxa is not working, i need a med tune up". Denies other physical sx, reports gritting teeth".  Denies substance abuse. Denies SI. Admits to HI. Denies Security called, protocol initiated. Security paged to wand pt. Lab drawing blood in triage. Takes coumadin for h/o PE. Pt calm, NAD, alert, interactive, skin W&D, resps e/u, polite, cooperative. Here from [facility]. By wife, wife present. [place] in [place] For dementia, blood clots, falls & parkinsons. "Possible h/o hallucinations, wife is not sure".

## 2012-07-15 NOTE — ED Notes (Signed)
[facility] paged. 

## 2012-07-15 NOTE — ED Notes (Signed)
Pt sitting on stretcher watching tv, sitter at bedside; no needs at this time

## 2012-07-15 NOTE — ED Notes (Signed)
Pt comes from [facility]).  Pt's wife states, "We have a name for this personality when he starts acting different.  It means "Merlyn Albert" is coming.   When asked has anything triggered the anger.Marland KitchenMarland KitchenPt stated.Marland KitchenMarland Kitchen"People at the house won't give me my space and that makes me angry."   Wife states, "He tends to cycle like this in the fall...usually in October but this year is different."

## 2012-07-15 NOTE — ED Notes (Signed)
Patient BP is elevated and Arnoldo Hooker, RN) request we hold patient till BP is lower.

## 2012-07-15 NOTE — BH Assessment (Addendum)
Assessment Note  Update:  Received call from John at [facility] stating pt accepted to Dr. Mayford Knife to bed [phone] and that pt could be transported (as referral sent to [facility] because beds available per Jonny Ruiz at 0845 today).  EDP Bonk and ED staff updated.  ED staff to arrange transport via [facility] to [place] and update pt's wife.  Writer to call when pt is on their way to their facility.  Updated assessment disposition, completed assessment notification and faxed to [facility] to log.       Disposition:  Disposition Disposition of Patient: Inpatient treatment program Type of inpatient treatment program: Adult Patient referred to: Other (Comment) (Pt accepted [facility])  On Site Evaluation by:   Reviewed with Physician:  Nehemiah Settle 07/15/2012 3:29 PM

## 2012-07-15 NOTE — Consult Note (Signed)
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  INR 1.4 and currently taking coumadin 1mg  alternating every other day with 2.5mg .   -  Start lovenox 150 mg subcutaneous injection q24h until INR 2-3 -  Increase coumadin to 2.5mg  daily x 4 days and recheck INR.  If between 2-3, return to alternating 1mg  with 2.5mg  every other day.     Hypertension/HLD, blood pressure mildly elevated in setting of recent psychiatric illness.  Defer HLD to outpatient management. Continue tricor and pravastatin -  Continue to monitor.  If persistently elevated SBP > 140, consider discontinuing metoprolol and starting carvedilol 6.25mg  BID and increase as needed  Hyponatremia and hypochloremia.  DDx includes dehydration or medication side effect.   -  Hold lasix and potassium x 2 days, then restart if electrolytes normalize -  Recommend 1L NS bolus then repeat BMP.   -  If still hyponatremic and hypochloremic, send serum and urine osms, urine creatinine and urine sodium. -  Will need repeat electrolytes in 1 week.  Hypothyroidism:  Patient has insomnia and agitation.   -  Repeat TSH.  COPD, stable.  Continue home medications.  I will followup again tomorrow. Please contact me if I can be of assistance in the meanwhile. Thank you for this consultation.  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  The patient was last at his  baseline health living in a nursing facility two weeks ago, when he developed insomnia and increasing agitation and pressured speech.  He began having strong feelings of hurting other people which he did not act out on.  He presented to the [facility] for management of psychiatric problems and was incidentally found to have a subtherapeutic INR.  Per patient, he had his first DVT 3 years ago in his left leg.  It occurred spontaneously and resolved after 6 months of coumadin.  His coumadin was discontinued but 6-12 months later, he developed recurrent DVT of the left lower extremity with pulmonary embolism causing mild dyspnea.  His anticoagulation was restarted with coumadin and he has been receiving coumadin from his nursing facility without interruption.  He has been taking coumadin 1mg  alternating with 2.5mg .  He denies current lower extremity pain or swelling, shortness of breath, or chest pain/tightness.    Review of Systems:   Denies fevers, chills, rhinorrhea, congestion, cough, wheeze, shortness of breath, chest pain, nausea, vomiting, diarrhea, constipation, blood in stools, dysuria, frequency of urination, abnormal bruising or bleeding, lymphadenopathy.  He has had anxiety and anger with feelings of wanting to hurt others.  Insomnia.     Past Medical History  Diagnosis Date  . COPD (chronic obstructive pulmonary disease)   . Pulmonary embolus   . Hypertension   . Bipolar 1 disorder   . Neuroleptic-induced Parkinsonism   . Anxiety   . Hyperlipidemia   . Falls infrequently   . Current use of long term anticoagulation   . Hypothyroidism    History reviewed. No pertinent past surgical history. Social History:  reports that  he has quit smoking. He does not have any smokeless tobacco history on file. He reports that he uses illicit drugs. He reports that he does not drink alcohol.  Allergies  Allergen Reactions  . Benzodiazepines Other (See Comments)    Causes aggression,  paradoxical reaction   Family History  Problem Relation Age of Onset  . Aneurysm Mother   . Other      No family history of DVT or PE    Prior to Admission medications   Medication Sig Start Date End Date Taking? Authorizing Provider  albuterol (PROVENTIL HFA;VENTOLIN HFA) 108 (90 BASE) MCG/ACT inhaler Inhale 2 puffs into the lungs every 6 (six) hours as needed. For shortness of breath   Yes Historical Provider, MD  benztropine (COGENTIN) 1 MG tablet Take 1 mg by mouth 2 (two) times daily.   Yes Historical Provider, MD  cholecalciferol (VITAMIN D) 1000 UNITS tablet Take 2,000 Units by mouth daily.   Yes Historical Provider, MD  divalproex (DEPAKOTE ER) 500 MG 24 hr tablet Take 1,000 mg by mouth daily.   Yes Historical Provider, MD  fenofibrate (TRICOR) 145 MG tablet Take 145 mg by mouth daily.   Yes Historical Provider, MD  Fluticasone-Salmeterol (ADVAIR) 250-50 MCG/DOSE AEPB Inhale 1 puff into the lungs 2 (two) times daily.   Yes Historical Provider, MD  furosemide (LASIX) 20 MG tablet Take 1 tablet (20 mg total) by mouth daily. 11/05/11 11/04/12 Yes Simbiso Ranga, MD  hydrOXYzine (VISTARIL) 50 MG capsule Take 50 mg by mouth 2 (two) times daily as needed. For anxiety   Yes Historical Provider, MD  lactulose (CHRONULAC) 10 GM/15ML solution Take 20 g by mouth 2 (two) times daily.   Yes Historical Provider, MD  levothyroxine (SYNTHROID, LEVOTHROID) 75 MCG tablet Take 75 mcg by mouth daily.   Yes Historical Provider, MD  loperamide (IMODIUM A-D) 2 MG tablet Take 2 mg by mouth 4 (four) times daily as needed. For diarrhea   Yes Historical Provider, MD  losartan (COZAAR) 50 MG tablet Take 50 mg by mouth daily.   Yes Historical Provider, MD  magnesium hydroxide (MILK OF MAGNESIA) 400 MG/5ML suspension Take 30 mLs by mouth daily as needed. For heartburn   Yes Historical Provider, MD  metoprolol (LOPRESSOR) 50 MG tablet Take 75 mg by mouth 2 (two) times daily.   Yes Historical Provider, MD  OLANZapine  (ZYPREXA) 20 MG tablet Take 20 mg by mouth at bedtime.   Yes Historical Provider, MD  Oxcarbazepine (TRILEPTAL) 300 MG tablet Take 300 mg by mouth 2 (two) times daily.   Yes Historical Provider, MD  potassium chloride (K-DUR,KLOR-CON) 10 MEQ tablet Take 1 tablet (10 mEq total) by mouth daily. 11/05/11 11/04/12 Yes Simbiso Ranga, MD  pravastatin (PRAVACHOL) 20 MG tablet Take 20 mg by mouth daily.   Yes Historical Provider, MD  tiotropium (SPIRIVA) 18 MCG inhalation capsule Place 1 capsule (18 mcg total) into inhaler and inhale daily. 11/05/11 11/04/12 Yes Simbiso Ranga, MD  vitamin B-12 (CYANOCOBALAMIN) 1000 MCG tablet Take 1,000 mcg by mouth daily.   Yes Historical Provider, MD  warfarin (COUMADIN) 1 MG tablet Take 1 mg by mouth every other day. Alternate with 2.5 mg   Yes Historical Provider, MD  warfarin (COUMADIN) 2.5 MG tablet Take 2.5 mg by mouth daily. Alternate with the 1 mg   Yes Historical Provider, MD   Physical Exam: Blood pressure 169/94, pulse 80, temperature 98.2 F (36.8 C), temperature source Oral, resp. rate 20, SpO2 98.00%. Filed Vitals:  07/15/12 0022 07/15/12 0544  BP: 161/90 169/94  Pulse: 67 80  Temp: 98.2 F (36.8 C) 98.2 F (36.8 C)  TempSrc: Oral Oral  Resp: 18 20  SpO2: 96% 98%     General:  Obese caucasian male, no acute distress, sitting on bed  Eyes: PERRL, anicteric, noninjected, EOMI  ENT:  Nares and OP nonerythematous, no plaques or exudates, MMM  Neck: supple, no JVP  Lymph:    Cardiovascular: RRR, no murmurs, rubs, or gallops  Respiratory: CTAB, no increased work of breathing  Abdomen: NABS, soft, nondistended, nontender, no organomegaly  Skin: No rash or ecchymoses.  No lower extremity erythema  Musculoskeletal:  Normal tone and bulk, no lower extremity pain or erythema.  FROM  Psychiatric: Pleasant and cooperative with questions and exam.  A&Ox4  Neurologic:  II-XII grossly intact, strength 5/5 throughout and symmetric.    Labs on  Admission:  Basic Metabolic Panel:  Lab 07/15/12 1610  NA 130*  K 3.6  CL 94*  CO2 24  GLUCOSE 115*  BUN 11  CREATININE 1.00  CALCIUM 10.0  MG --  PHOS --   Liver Function Tests:  Lab 07/15/12 0040  AST 17  ALT 17  ALKPHOS 51  BILITOT 0.3  PROT 7.2  ALBUMIN 3.9   No results found for this basename: LIPASE:5,AMYLASE:5 in the last 168 hours No results found for this basename: AMMONIA:5 in the last 168 hours CBC:  Lab 07/15/12 0040  WBC 8.8  NEUTROABS 5.3  HGB 15.2  HCT 44.2  MCV 84.5  PLT 298   Cardiac Enzymes: No results found for this basename: CKTOTAL:5,CKMB:5,CKMBINDEX:5,TROPONINI:5 in the last 168 hours BNP: No components found with this basename: POCBNP:5 CBG: No results found for this basename: GLUCAP:5 in the last 168 hours  Radiological Exams on Admission: No results found.   Time spent: 45 min  Renae Fickle [facility] Pager [phone]  If 7PM-7AM, please contact night-coverage [email or web address] Password [email or web address] 07/15/2012, 12:14 PM

## 2012-07-15 NOTE — ED Notes (Signed)
[facility] is transporting patient to [facility]. ETA 1700.
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
## 2012-07-15 NOTE — ED Notes (Signed)
Patient's BP is elevated again and Derek Hicks, [facility] fund[facility] at [facility] states they can not take patient until BP is more controlled.

## 2012-07-15 NOTE — ED Notes (Signed)
[facility] arrived to transport pt, pt's blood pressure again too high to [facility] to accept pt.  [facility] will not transport.

## 2012-07-15 NOTE — ED Provider Notes (Signed)
History     CSN: [id number]  Arrival date & time 07/15/12  0017   First MD Initiated Contact with Patient 07/15/12 0045      Chief Complaint  Patient presents with  . Medical Clearance    (Consider location/radiation/quality/duration/timing/severity/associated sxs/prior treatment) HPI  Please note that this is a late entry. This patient was seen and examined by me several hours ago. The patient is a 61 year old man with history of bipolar disorder as well as Parkinson's disease with some associated dementia. He lives at skilled nursing facility. He is brought to the emergency department at his request. The patient says he feels like he needs "and patient cannot" for treatment of his bipolar disorder. He has been increasingly agitated for the past couple of weeks despite compliance with all medications. He denies any new medications. His bipolar disorder is treated with olanzapine 20 mg each bedtime and Depakote ER 1000 mg every morning.  Patient says he has felt urges and impulses to hurt a couple of his fellow residents at his facility. He has not really devise a plan. He just has urges to punch people. He is particularly agitated at other residents entered his room. The patient has no history of violent behavior. He denies alcohol use. He denies illicit drug use. The patient and his wife state that they are unaware of any recent medication changes.  The patient reports that he has had increasingly severe insomnia for the past 2 weeks. Neither the patient or his wife Meriam Sprague noticed a slight of ideas, pressured speech, grandiosity, hyperreligious thoughts. The patient's last inpatient psychiatric hospitalization was approximately 4 years ago. The patient is followed by a mid-level provider in psychiatry a local [facility]. However, at this practitioner recently left his practice and moved to [facility], [place].  Thus, patient has not been able to f/u with him.   Past Medical History    Diagnosis Date  . COPD (chronic obstructive pulmonary disease)   . Pulmonary embolus   . Hypertension   . Bipolar 1 disorder   . Neuroleptic-induced Parkinsonism   . Anxiety   . Hyperlipidemia   . Falls infrequently     History reviewed. No pertinent past surgical history.  No family history on file.  History  Substance Use Topics  . Smoking status: Never Smoker   . Smokeless tobacco: Not on file  . Alcohol Use: No      Review of Systems Gen: no weight loss, fevers, chills, night sweats Eyes: no discharge or drainage, no occular pain or visual changes Nose: no epistaxis or rhinorrhea Mouth: no dental pain, no sore throat Neck: no neck pain Lungs: no SOB, cough, wheezing CV: no chest pain, palpitations, dependent edema or orthopnea Abd: no abdominal pain, nausea, vomiting GU: no dysuria or gross hematuria MSK: no myalgias or arthralgias Neuro: chronic parkinson associated sx, no acute complaints. Skin: psoriasis Psyche: as per hpi, otherwise negative  Allergies  Benzodiazepines  Home Medications   Current Outpatient Rx  Name Route Sig Dispense Refill  . ACETAMINOPHEN 500 MG PO TABS Oral Take 500 mg by mouth every 6 (six) hours as needed. For pain    . ALBUTEROL SULFATE HFA 108 (90 BASE) MCG/ACT IN AERS Inhalation Inhale 2 puffs into the lungs every 6 (six) hours as needed. For shortness of breath    . BENZTROPINE MESYLATE 1 MG PO TABS Oral Take 1 mg by mouth 2 (two) times daily.    Marland Kitchen VITAMIN D 1000 UNITS PO TABS  Oral Take 2,000 Units by mouth daily.    Marland Kitchen DIVALPROEX SODIUM ER 500 MG PO TB24 Oral Take 1,000 mg by mouth daily.    . FENOFIBRATE 145 MG PO TABS Oral Take 145 mg by mouth daily.    Marland Kitchen FLUTICASONE-SALMETEROL 250-50 MCG/DOSE IN AEPB Inhalation Inhale 1 puff into the lungs 2 (two) times daily.    . FUROSEMIDE 20 MG PO TABS Oral Take 1 tablet (20 mg total) by mouth daily. 30 tablet 0  . HYDROXYZINE PAMOATE 50 MG PO CAPS Oral Take 50 mg by mouth 2 (two) times  daily as needed. For anxiety    . LACTULOSE 10 GM/15ML PO SOLN Oral Take 20 g by mouth 2 (two) times daily.    Marland Kitchen LEVOTHYROXINE SODIUM 75 MCG PO TABS Oral Take 75 mcg by mouth daily.    Marland Kitchen LOPERAMIDE HCL 2 MG PO TABS Oral Take 2 mg by mouth 4 (four) times daily as needed. For diarrhea    . LOSARTAN POTASSIUM 50 MG PO TABS Oral Take 50 mg by mouth daily.    Marland Kitchen MAGNESIUM HYDROXIDE 400 MG/5ML PO SUSP Oral Take 30 mLs by mouth daily as needed. For heartburn    . METOPROLOL TARTRATE 50 MG PO TABS Oral Take 75 mg by mouth 2 (two) times daily.    Marland Kitchen OLANZAPINE 20 MG PO TABS Oral Take 20 mg by mouth at bedtime.    Marland Kitchen OXCARBAZEPINE 300 MG PO TABS Oral Take 300 mg by mouth 2 (two) times daily.    Marland Kitchen POTASSIUM CHLORIDE CRYS ER 10 MEQ PO TBCR Oral Take 1 tablet (10 mEq total) by mouth daily. 30 tablet 0  . PRAVASTATIN SODIUM 20 MG PO TABS Oral Take 20 mg by mouth daily.    Marland Kitchen TIOTROPIUM BROMIDE MONOHYDRATE 18 MCG IN CAPS Inhalation Place 1 capsule (18 mcg total) into inhaler and inhale daily. 30 capsule 0  . VITAMIN B-12 1000 MCG PO TABS Oral Take 1,000 mcg by mouth daily.    . WARFARIN SODIUM 1 MG PO TABS Oral Take 1 mg by mouth every other day. Alternate with 2.5 mg    . WARFARIN SODIUM 2.5 MG PO TABS Oral Take 2.5 mg by mouth daily. Alternate with the 1 mg      BP 169/94  Pulse 80  Temp 98.2 F (36.8 C) (Oral)  Resp 20  SpO2 98%  Physical Exam  Gen: well developed and well nourished appearing Head: NCAT Eyes: PERL, EOMI Nose: no epistaixis or rhinorrhea Mouth/throat: mucosa is moist and pink Neck: supple, no stridor Lungs: CTA B, no wheezing, rhonchi or rales CV: RRR, no murmur Abd: soft, obese, notender, nondistended Back: no ttp, no cva ttp Skin: no rashese, wnl Neuro: CN ii-xii grossly intact, patient frequently rolls his eyes up while he is speaking and has some very mild expressive aphasia Psyche; normal affect,  calm and cooperative, seems to have good insight  ED Course  Procedures  (including critical care time)  Results for orders placed during the hospital encounter of 07/15/12 (from the past 24 hour(s))  COMPREHENSIVE METABOLIC PANEL     Status: Abnormal   Collection Time   07/15/12 12:40 AM      Component Value Range   Sodium 130 (*) 135 - 145 mEq/L   Potassium 3.6  3.5 - 5.1 mEq/L   Chloride 94 (*) 96 - 112 mEq/L   CO2 24  19 - 32 mEq/L   Glucose, Bld 115 (*) 70 - 99 mg/dL  BUN 11  6 - 23 mg/dL   Creatinine, Ser 1.61  0.50 - 1.35 mg/dL   Calcium 09.6  8.4 - 04.5 mg/dL   Total Protein 7.2  6.0 - 8.3 g/dL   Albumin 3.9  3.5 - 5.2 g/dL   AST 17  0 - 37 U/L   ALT 17  0 - 53 U/L   Alkaline Phosphatase 51  39 - 117 U/L   Total Bilirubin 0.3  0.3 - 1.2 mg/dL   GFR calc non Af Amer 80 (*) >90 mL/min   GFR calc Af Amer >90  >90 mL/min  CBC WITH DIFFERENTIAL     Status: Normal   Collection Time   07/15/12 12:40 AM      Component Value Range   WBC 8.8  4.0 - 10.5 K/uL   RBC 5.23  4.22 - 5.81 MIL/uL   Hemoglobin 15.2  13.0 - 17.0 g/dL   HCT 40.9  81.1 - 91.4 %   MCV 84.5  78.0 - 100.0 fL   MCH 29.1  26.0 - 34.0 pg   MCHC 34.4  30.0 - 36.0 g/dL   RDW 78.2  95.6 - 21.3 %   Platelets 298  150 - 400 K/uL   Neutrophils Relative 60  43 - 77 %   Neutro Abs 5.3  1.7 - 7.7 K/uL   Lymphocytes Relative 27  12 - 46 %   Lymphs Abs 2.4  0.7 - 4.0 K/uL   Monocytes Relative 10  3 - 12 %   Monocytes Absolute 0.9  0.1 - 1.0 K/uL   Eosinophils Relative 2  0 - 5 %   Eosinophils Absolute 0.2  0.0 - 0.7 K/uL   Basophils Relative 1  0 - 1 %   Basophils Absolute 0.1  0.0 - 0.1 K/uL  ETHANOL     Status: Normal   Collection Time   07/15/12 12:40 AM      Component Value Range   Alcohol, Ethyl (B) <11  0 - 11 mg/dL  PROTIME-INR     Status: Abnormal   Collection Time   07/15/12 12:40 AM      Component Value Range   Prothrombin Time 16.6 (*) 11.6 - 15.2 seconds   INR 1.38  0.00 - 1.49  VALPROIC ACID LEVEL     Status: Abnormal   Collection Time   07/15/12  2:23 AM       Component Value Range   Valproic Acid Lvl 32.8 (*) 50.0 - 100.0 ug/mL  URINE RAPID DRUG SCREEN (HOSP PERFORMED)     Status: Normal   Collection Time   07/15/12  5:03 AM      Component Value Range   Opiates NONE DETECTED  NONE DETECTED   Cocaine NONE DETECTED  NONE DETECTED   Benzodiazepines NONE DETECTED  NONE DETECTED   Amphetamines NONE DETECTED  NONE DETECTED   Tetrahydrocannabinol NONE DETECTED  NONE DETECTED   Barbiturates NONE DETECTED  NONE DETECTED      1. 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 Of note, the patient is subtherapeutic on Coumadin for tx of PE.  We will request hospitalist consultation for adustment of Coumadin dose and tx with Lovenox in ED.  The patient has been evaluated via telepsyche physician, Dr. Reece Leader. Dr. Reece Leader believes that the patient is potentially dangerous to others, based on the patient's statements. He recommends inpatient tx.  The patient is a voluntary  admission. Thus, we will not initiate IVC paperwork at this time. BH consultant is working on placement at this time. We will continue the patient's current medications.   MDM  See above        Brandt Loosen, MD 07/15/12 [phone]

## 2012-07-15 NOTE — Progress Notes (Signed)
9:59 PM Pt has had highly labile BP.  Twice [facility] came to transport him, but his BP was too high.  Will place him on Losartan 75 mg bid and Labetalol 100 mg po bid.  Also Rx Trazodone 100 mg hs.  His BP can be re-evaluated in AM to see if it is good enough for him to be transferred to a psychiatric facility.

## 2012-07-15 NOTE — ED Notes (Signed)
Pt up ambulatory to the bathroom at this time, sitter with pt

## 2012-07-15 NOTE — ED Notes (Signed)
Breakfast tray given to pt 

## 2012-07-15 NOTE — ED Notes (Signed)
Called [facility] since we were able to get pt's BP down; they will accept pt.

## 2012-07-15 NOTE — ED Provider Notes (Signed)
Filed Vitals:   07/15/12 0544  BP: 169/94  Pulse: 80  Temp: 98.2 F (36.8 C)  Resp: 20    [facility] is a 61 y.o. male having thoughts of hurting others.  C/o headache for which he typically takes Tylenol.  C/o poor sleep last night - ongoing insomnia.  No other complaints.  Jones Skene, MD 07/15/12 0745

## 2012-07-15 NOTE — ED Notes (Signed)
Pt presented to ED with harm to others.Pt is cooperative but says he can not control his thoughts of harming others.

## 2012-07-15 NOTE — ED Notes (Signed)
Telepsych in progress. 

## 2012-07-16 MED ORDER — ENOXAPARIN SODIUM 150 MG/ML ~~LOC~~ SOLN
150.0000 mg | SUBCUTANEOUS | Status: DC
  Filled 2012-07-16: qty 1

## 2012-07-16 NOTE — ED Notes (Signed)
Patient alert and oriented this morning.  Patient aware he is being transferred to [facility].  Patient vital signs within normal limits.

## 2012-07-16 NOTE — ED Provider Notes (Signed)
Filed Vitals:   07/16/12 0602  BP: 99/60  Pulse: 66  Temp: 97.4 F (36.3 C)  Resp: 20    Pt sleeping this am.  Held overnight due to elevated BP.  BP this am is 99/60.  Medically cleared.  Stable for transfer.  Celene Kras, MD 07/16/12 [phone]

## 2012-07-16 NOTE — ED Notes (Signed)
Pt. Unable to transfer to [facility] at this time due to BP. Pt. Will stay the night here per Dr. Ignacia Palma and will reassess BP in morning.

## 2012-08-04 ENCOUNTER — Emergency Department (HOSPITAL_COMMUNITY)
Admission: EM | Admit: 2012-08-04 | Discharge: 2012-08-04 | Disposition: A | Discharge status: Skilled Nursing Facility | Payer: Medicare Other | Attending: Emergency Medicine | Admitting: Emergency Medicine

## 2012-08-04 ENCOUNTER — Emergency Department (HOSPITAL_COMMUNITY): Payer: Medicare Other

## 2012-08-04 ENCOUNTER — Encounter (HOSPITAL_COMMUNITY): Payer: Self-pay | Admitting: Emergency Medicine

## 2012-08-04 DIAGNOSIS — R1310 Dysphagia, unspecified: Secondary | ICD-10-CM | POA: Insufficient documentation

## 2012-08-04 DIAGNOSIS — E039 Hypothyroidism, unspecified: Secondary | ICD-10-CM | POA: Insufficient documentation

## 2012-08-04 DIAGNOSIS — Z79899 Other long term (current) drug therapy: Secondary | ICD-10-CM | POA: Insufficient documentation

## 2012-08-04 DIAGNOSIS — F319 Bipolar disorder, unspecified: Secondary | ICD-10-CM | POA: Insufficient documentation

## 2012-08-04 DIAGNOSIS — Z87891 Personal history of nicotine dependence: Secondary | ICD-10-CM | POA: Insufficient documentation

## 2012-08-04 DIAGNOSIS — G250 Essential tremor: Secondary | ICD-10-CM | POA: Insufficient documentation

## 2012-08-04 DIAGNOSIS — J449 Chronic obstructive pulmonary disease, unspecified: Secondary | ICD-10-CM | POA: Insufficient documentation

## 2012-08-04 DIAGNOSIS — G20 Parkinson's disease: Secondary | ICD-10-CM | POA: Insufficient documentation

## 2012-08-04 DIAGNOSIS — J4489 Other specified chronic obstructive pulmonary disease: Secondary | ICD-10-CM | POA: Insufficient documentation

## 2012-08-04 DIAGNOSIS — R251 Tremor, unspecified: Secondary | ICD-10-CM

## 2012-08-04 DIAGNOSIS — G20A1 Parkinson's disease without dyskinesia, without mention of fluctuations: Secondary | ICD-10-CM | POA: Insufficient documentation

## 2012-08-04 DIAGNOSIS — Z7901 Long term (current) use of anticoagulants: Secondary | ICD-10-CM | POA: Insufficient documentation

## 2012-08-04 DIAGNOSIS — E785 Hyperlipidemia, unspecified: Secondary | ICD-10-CM | POA: Insufficient documentation

## 2012-08-04 DIAGNOSIS — I10 Essential (primary) hypertension: Secondary | ICD-10-CM | POA: Insufficient documentation

## 2012-08-04 DIAGNOSIS — Z86711 Personal history of pulmonary embolism: Secondary | ICD-10-CM | POA: Insufficient documentation

## 2012-08-04 LAB — COMPREHENSIVE METABOLIC PANEL
Albumin: 3.9 g/dL (ref 3.5–5.2)
BUN: 10 mg/dL (ref 6–23)
Creatinine, Ser: 1.23 mg/dL (ref 0.50–1.35)
Total Protein: 7.3 g/dL (ref 6.0–8.3)

## 2012-08-04 LAB — PROTIME-INR: INR: 1.38 (ref 0.00–1.49)

## 2012-08-04 LAB — LITHIUM LEVEL: Lithium Lvl: 0.72 mEq/L — ABNORMAL LOW (ref 0.80–1.40)

## 2012-08-04 LAB — URINALYSIS, ROUTINE W REFLEX MICROSCOPIC
Nitrite: NEGATIVE
Specific Gravity, Urine: 1.009 (ref 1.005–1.030)
Urobilinogen, UA: 1 mg/dL (ref 0.0–1.0)

## 2012-08-04 LAB — CBC WITH DIFFERENTIAL/PLATELET
Basophils Relative: 1 % (ref 0–1)
Eosinophils Absolute: 0.3 10*3/uL (ref 0.0–0.7)
Lymphocytes Relative: 18 % (ref 12–46)
Lymphs Abs: 1.6 10*3/uL (ref 0.7–4.0)
MCV: 84.8 fL (ref 78.0–100.0)
Monocytes Absolute: 0.6 10*3/uL (ref 0.1–1.0)
Neutro Abs: 6.3 10*3/uL (ref 1.7–7.7)
Platelets: 341 10*3/uL (ref 150–400)
RBC: 5.26 MIL/uL (ref 4.22–5.81)
RDW: 12.6 % (ref 11.5–15.5)
WBC: 8.9 10*3/uL (ref 4.0–10.5)

## 2012-08-04 LAB — VALPROIC ACID LEVEL: Valproic Acid Lvl: <10 ug/mL — ABNORMAL LOW (ref 50.0–100.0)

## 2012-08-04 MED ORDER — SODIUM CHLORIDE 0.9 % IV SOLN
INTRAVENOUS | Status: DC
  Administered 2012-08-04: 16:00:00 via INTRAVENOUS

## 2012-08-04 NOTE — ED Provider Notes (Signed)
History     CSN: [id number]  Arrival date & time 08/04/12  1348   First MD Initiated Contact with Patient 08/04/12 1513      Chief Complaint  Patient presents with  . Tremors    (Consider location/radiation/quality/duration/timing/severity/associated sxs/prior treatment) The history is provided by the patient.   patient here complaining of diffuse body tremors or sensory 4 hours. Does have a history of Parkinson's disease states that today his tremors are causing trouble swallowing. Was seen by the nursing home Dr. Eli Phillips for evaluation. Patient was recently started on lithium. Denies any chills or fever. No urinary symptoms. Denies any cough or shortness of breath. Denies any actual falls. No headaches. Symptoms have been persistent and are worse when he tries to do activity  Past Medical History  Diagnosis Date  . COPD (chronic obstructive pulmonary disease)   . Pulmonary embolus   . Hypertension   . Bipolar 1 disorder   . Neuroleptic-induced Parkinsonism   . Anxiety   . Hyperlipidemia   . Falls infrequently   . Current use of long term anticoagulation   . Hypothyroidism     History reviewed. No pertinent past surgical history.  Family History  Problem Relation Age of Onset  . Aneurysm Mother   . Other      No family history of DVT or PE    History  Substance Use Topics  . Smoking status: Former Smoker -- 20 years  . Smokeless tobacco: Not on file  . Alcohol Use: No      Review of Systems  All other systems reviewed and are negative.    Allergies  Benzodiazepines  Home Medications   Current Outpatient Rx  Name Route Sig Dispense Refill  . ALBUTEROL SULFATE HFA 108 (90 BASE) MCG/ACT IN AERS Inhalation Inhale 2 puffs into the lungs every 6 (six) hours as needed. For shortness of breath    . BENZTROPINE MESYLATE 1 MG PO TABS Oral Take 1 mg by mouth 2 (two) times daily.    Marland Kitchen VITAMIN D 1000 UNITS PO TABS Oral Take 2,000 Units by mouth daily.    Marland Kitchen  DIVALPROEX SODIUM ER 500 MG PO TB24 Oral Take 1,000 mg by mouth daily.    . FENOFIBRATE 145 MG PO TABS Oral Take 145 mg by mouth daily.    Marland Kitchen FLUTICASONE-SALMETEROL 250-50 MCG/DOSE IN AEPB Inhalation Inhale 1 puff into the lungs 2 (two) times daily.    . FUROSEMIDE 20 MG PO TABS Oral Take 1 tablet (20 mg total) by mouth daily. 30 tablet 0  . HYDROXYZINE PAMOATE 50 MG PO CAPS Oral Take 50 mg by mouth 2 (two) times daily as needed. For anxiety    . LACTULOSE 10 GM/15ML PO SOLN Oral Take 20 g by mouth 2 (two) times daily.    Marland Kitchen LEVOTHYROXINE SODIUM 75 MCG PO TABS Oral Take 75 mcg by mouth daily.    Marland Kitchen LOPERAMIDE HCL 2 MG PO TABS Oral Take 2 mg by mouth 4 (four) times daily as needed. For diarrhea    . LOSARTAN POTASSIUM 50 MG PO TABS Oral Take 50 mg by mouth daily.    Marland Kitchen MAGNESIUM HYDROXIDE 400 MG/5ML PO SUSP Oral Take 30 mLs by mouth daily as needed. For heartburn    . METOPROLOL TARTRATE 50 MG PO TABS Oral Take 75 mg by mouth 2 (two) times daily.    Marland Kitchen OLANZAPINE 20 MG PO TABS Oral Take 20 mg by mouth at bedtime.    Marland Kitchen  OXCARBAZEPINE 300 MG PO TABS Oral Take 300 mg by mouth 2 (two) times daily.    Marland Kitchen POTASSIUM CHLORIDE CRYS ER 10 MEQ PO TBCR Oral Take 1 tablet (10 mEq total) by mouth daily. 30 tablet 0  . PRAVASTATIN SODIUM 20 MG PO TABS Oral Take 20 mg by mouth daily.    Marland Kitchen TIOTROPIUM BROMIDE MONOHYDRATE 18 MCG IN CAPS Inhalation Place 1 capsule (18 mcg total) into inhaler and inhale daily. 30 capsule 0  . VITAMIN B-12 1000 MCG PO TABS Oral Take 1,000 mcg by mouth daily.    . WARFARIN SODIUM 1 MG PO TABS Oral Take 1 mg by mouth every other day. Alternate with 2.5 mg    . WARFARIN SODIUM 2.5 MG PO TABS Oral Take 2.5 mg by mouth daily. Alternate with the 1 mg      BP 166/87  Pulse 86  Temp 99.8 F (37.7 C) (Oral)  Resp 20  SpO2 96%  Physical Exam  Nursing note and vitals reviewed. Constitutional: He is oriented to person, place, and time. He appears well-developed and well-nourished.  Non-toxic  appearance. No distress.  HENT:  Head: Normocephalic and atraumatic.  Eyes: Conjunctivae normal, EOM and lids are normal. Pupils are equal, round, and reactive to light.  Neck: Normal range of motion. Neck supple. No tracheal deviation present. No mass present.  Cardiovascular: Normal rate, regular rhythm and normal heart sounds.  Exam reveals no gallop.   No murmur heard. Pulmonary/Chest: Effort normal and breath sounds normal. No stridor. No respiratory distress. He has no decreased breath sounds. He has no wheezes. He has no rhonchi. He has no rales.  Abdominal: Soft. Normal appearance and bowel sounds are normal. He exhibits no distension. There is no tenderness. There is no rebound and no CVA tenderness.  Musculoskeletal: Normal range of motion. He exhibits no edema and no tenderness.  Neurological: He is alert and oriented to person, place, and time. He displays tremor. No cranial nerve deficit or sensory deficit. He displays no seizure activity. GCS eye subscore is 4. GCS verbal subscore is 5. GCS motor subscore is 6.  Skin: Skin is warm and dry. No abrasion and no rash noted.  Psychiatric: He has a normal mood and affect. His speech is normal and behavior is normal.    ED Course  Procedures (including critical care time)   Labs Reviewed  PROTIME-INR  CBC WITH DIFFERENTIAL  COMPREHENSIVE METABOLIC PANEL  LITHIUM LEVEL  VALPROIC ACID LEVEL  URINALYSIS, ROUTINE W REFLEX MICROSCOPIC  URINE CULTURE   No results found.   No diagnosis found.    MDM  Patient with negative head CT and evaluation of blood work is also negative as well. No seizure activity here. Patient will followup with his neurologist in [facility]        Toy Baker, MD 08/04/12 Barry Brunner

## 2012-08-04 NOTE — ED Notes (Signed)
Pt presenting to ed with c/o worsening tremors pt sent from [facility] by his doctor. Pt denies pain at this time pt states history of parkinson. Pt is alert and oriented at this time.

## 2012-08-04 NOTE — ED Notes (Signed)
Received pt in rm 7, pt c/o tremors generalized starting yesterday and got progressively worse today. Pt sts hx of seizures, last seizure today. Pt sts he is taking his seizure medications as prescribed. IV started, pt resting.

## 2012-08-06 LAB — URINE CULTURE

## 2012-12-03 ENCOUNTER — Inpatient Hospital Stay (HOSPITAL_COMMUNITY)
Admission: EM | Admit: 2012-12-03 | Discharge: 2012-12-08 | DRG: 683 | Disposition: A | Discharge status: Skilled Nursing Facility | Payer: Medicare Other | Attending: Internal Medicine | Admitting: Internal Medicine

## 2012-12-03 ENCOUNTER — Emergency Department (HOSPITAL_COMMUNITY): Payer: Medicare Other

## 2012-12-03 ENCOUNTER — Encounter (HOSPITAL_COMMUNITY): Payer: Self-pay | Admitting: Emergency Medicine

## 2012-12-03 DIAGNOSIS — I2699 Other pulmonary embolism without acute cor pulmonale: Secondary | ICD-10-CM | POA: Diagnosis present

## 2012-12-03 DIAGNOSIS — Z9181 History of falling: Secondary | ICD-10-CM

## 2012-12-03 DIAGNOSIS — T4275XA Adverse effect of unspecified antiepileptic and sedative-hypnotic drugs, initial encounter: Secondary | ICD-10-CM | POA: Diagnosis present

## 2012-12-03 DIAGNOSIS — T43205A Adverse effect of unspecified antidepressants, initial encounter: Secondary | ICD-10-CM | POA: Diagnosis present

## 2012-12-03 DIAGNOSIS — S0083XA Contusion of other part of head, initial encounter: Secondary | ICD-10-CM | POA: Diagnosis present

## 2012-12-03 DIAGNOSIS — G3183 Dementia with Lewy bodies: Secondary | ICD-10-CM | POA: Diagnosis present

## 2012-12-03 DIAGNOSIS — N179 Acute kidney failure, unspecified: Principal | ICD-10-CM | POA: Diagnosis present

## 2012-12-03 DIAGNOSIS — F0280 Dementia in other diseases classified elsewhere without behavioral disturbance: Secondary | ICD-10-CM | POA: Diagnosis present

## 2012-12-03 DIAGNOSIS — R251 Tremor, unspecified: Secondary | ICD-10-CM

## 2012-12-03 DIAGNOSIS — Z7901 Long term (current) use of anticoagulants: Secondary | ICD-10-CM

## 2012-12-03 DIAGNOSIS — J4489 Other specified chronic obstructive pulmonary disease: Secondary | ICD-10-CM | POA: Diagnosis present

## 2012-12-03 DIAGNOSIS — Y9210 Unspecified residential institution as the place of occurrence of the external cause: Secondary | ICD-10-CM | POA: Diagnosis present

## 2012-12-03 DIAGNOSIS — E785 Hyperlipidemia, unspecified: Secondary | ICD-10-CM | POA: Diagnosis present

## 2012-12-03 DIAGNOSIS — W19XXXA Unspecified fall, initial encounter: Secondary | ICD-10-CM | POA: Diagnosis present

## 2012-12-03 DIAGNOSIS — Z79899 Other long term (current) drug therapy: Secondary | ICD-10-CM

## 2012-12-03 DIAGNOSIS — E039 Hypothyroidism, unspecified: Secondary | ICD-10-CM | POA: Diagnosis present

## 2012-12-03 DIAGNOSIS — F419 Anxiety disorder, unspecified: Secondary | ICD-10-CM

## 2012-12-03 DIAGNOSIS — R791 Abnormal coagulation profile: Secondary | ICD-10-CM | POA: Diagnosis present

## 2012-12-03 DIAGNOSIS — F1921 Other psychoactive substance dependence, in remission: Secondary | ICD-10-CM | POA: Diagnosis not present

## 2012-12-03 DIAGNOSIS — J449 Chronic obstructive pulmonary disease, unspecified: Secondary | ICD-10-CM | POA: Diagnosis present

## 2012-12-03 DIAGNOSIS — F411 Generalized anxiety disorder: Secondary | ICD-10-CM | POA: Diagnosis present

## 2012-12-03 DIAGNOSIS — M5380 Other specified dorsopathies, site unspecified: Secondary | ICD-10-CM | POA: Diagnosis not present

## 2012-12-03 DIAGNOSIS — Z87891 Personal history of nicotine dependence: Secondary | ICD-10-CM

## 2012-12-03 DIAGNOSIS — F319 Bipolar disorder, unspecified: Secondary | ICD-10-CM

## 2012-12-03 DIAGNOSIS — R4182 Altered mental status, unspecified: Secondary | ICD-10-CM | POA: Diagnosis present

## 2012-12-03 DIAGNOSIS — E860 Dehydration: Secondary | ICD-10-CM | POA: Diagnosis present

## 2012-12-03 DIAGNOSIS — Z86711 Personal history of pulmonary embolism: Secondary | ICD-10-CM

## 2012-12-03 DIAGNOSIS — T43505A Adverse effect of unspecified antipsychotics and neuroleptics, initial encounter: Secondary | ICD-10-CM | POA: Diagnosis present

## 2012-12-03 DIAGNOSIS — S0003XA Contusion of scalp, initial encounter: Secondary | ICD-10-CM | POA: Diagnosis present

## 2012-12-03 DIAGNOSIS — F3130 Bipolar disorder, current episode depressed, mild or moderate severity, unspecified: Secondary | ICD-10-CM | POA: Diagnosis present

## 2012-12-03 DIAGNOSIS — G2111 Neuroleptic induced parkinsonism: Secondary | ICD-10-CM | POA: Diagnosis present

## 2012-12-03 DIAGNOSIS — I10 Essential (primary) hypertension: Secondary | ICD-10-CM | POA: Diagnosis present

## 2012-12-03 DIAGNOSIS — T438X5A Adverse effect of other psychotropic drugs, initial encounter: Secondary | ICD-10-CM | POA: Diagnosis present

## 2012-12-03 DIAGNOSIS — G479 Sleep disorder, unspecified: Secondary | ICD-10-CM | POA: Diagnosis present

## 2012-12-03 HISTORY — DX: Shortness of breath: R06.02

## 2012-12-03 LAB — URINE MICROSCOPIC-ADD ON

## 2012-12-03 LAB — CBC
MCH: 29.4 pg (ref 26.0–34.0)
Platelets: 347 10*3/uL (ref 150–400)
RBC: 4.18 MIL/uL — ABNORMAL LOW (ref 4.22–5.81)
WBC: 15.5 10*3/uL — ABNORMAL HIGH (ref 4.0–10.5)

## 2012-12-03 LAB — COMPREHENSIVE METABOLIC PANEL
ALT: 14 U/L (ref 0–53)
AST: 69 U/L — ABNORMAL HIGH (ref 0–37)
CO2: 23 mEq/L (ref 19–32)
Calcium: 9.7 mg/dL (ref 8.4–10.5)
Chloride: 101 mEq/L (ref 96–112)
GFR calc non Af Amer: 25 mL/min — ABNORMAL LOW (ref >90)
Sodium: 137 mEq/L (ref 135–145)

## 2012-12-03 LAB — URINALYSIS, ROUTINE W REFLEX MICROSCOPIC
Nitrite: NEGATIVE
Specific Gravity, Urine: 1.024 (ref 1.005–1.030)
Urobilinogen, UA: 1 mg/dL (ref 0.0–1.0)

## 2012-12-03 LAB — PROTIME-INR: INR: 5.15 (ref 0.00–1.49)

## 2012-12-03 LAB — LITHIUM LEVEL: Lithium Lvl: 1.26 mEq/L (ref 0.80–1.40)

## 2012-12-03 LAB — MRSA PCR SCREENING: MRSA by PCR: NEGATIVE

## 2012-12-03 MED ORDER — BIOTENE DRY MOUTH MT LIQD
15.0000 mL | Freq: Two times a day (BID) | OROMUCOSAL | Status: DC
  Administered 2012-12-03 – 2012-12-08 (×11): 15 mL via OROMUCOSAL

## 2012-12-03 MED ORDER — TIOTROPIUM BROMIDE MONOHYDRATE 18 MCG IN CAPS
18.0000 ug | ORAL_CAPSULE | Freq: Every day | RESPIRATORY_TRACT | Status: DC
  Administered 2012-12-04 – 2012-12-08 (×5): 18 ug via RESPIRATORY_TRACT
  Filled 2012-12-03: qty 5

## 2012-12-03 MED ORDER — SIMVASTATIN 10 MG PO TABS
10.0000 mg | ORAL_TABLET | Freq: Every day | ORAL | Status: DC
  Administered 2012-12-03 – 2012-12-08 (×6): 10 mg via ORAL
  Filled 2012-12-03 (×6): qty 1

## 2012-12-03 MED ORDER — DIVALPROEX SODIUM ER 250 MG PO TB24
250.0000 mg | ORAL_TABLET | Freq: Every day | ORAL | Status: DC
  Administered 2012-12-03 – 2012-12-05 (×3): 250 mg via ORAL
  Filled 2012-12-03 (×3): qty 1

## 2012-12-03 MED ORDER — SODIUM CHLORIDE 0.9 % IV BOLUS (SEPSIS)
1000.0000 mL | Freq: Once | INTRAVENOUS | Status: AC
  Administered 2012-12-03: 1000 mL via INTRAVENOUS

## 2012-12-03 MED ORDER — LITHIUM CARBONATE ER 450 MG PO TBCR
900.0000 mg | EXTENDED_RELEASE_TABLET | Freq: Every day | ORAL | Status: DC
  Administered 2012-12-03 – 2012-12-05 (×3): 900 mg via ORAL
  Filled 2012-12-03 (×4): qty 2

## 2012-12-03 MED ORDER — LEVOTHYROXINE SODIUM 75 MCG PO TABS
75.0000 ug | ORAL_TABLET | Freq: Every day | ORAL | Status: DC
  Administered 2012-12-03 – 2012-12-08 (×6): 75 ug via ORAL
  Filled 2012-12-03 (×8): qty 1

## 2012-12-03 MED ORDER — CARBIDOPA-LEVODOPA 25-100 MG PO TABS
0.5000 | ORAL_TABLET | Freq: Three times a day (TID) | ORAL | Status: DC
  Administered 2012-12-03 – 2012-12-05 (×5): 0.5 via ORAL
  Filled 2012-12-03 (×10): qty 0.5

## 2012-12-03 MED ORDER — HEPARIN SODIUM (PORCINE) 5000 UNIT/ML IJ SOLN
5000.0000 [IU] | Freq: Three times a day (TID) | INTRAMUSCULAR | Status: DC
Start: 2012-12-03 — End: 2012-12-04
  Administered 2012-12-03 – 2012-12-04 (×3): 5000 [IU] via SUBCUTANEOUS
  Filled 2012-12-03 (×6): qty 1

## 2012-12-03 MED ORDER — OXCARBAZEPINE 150 MG PO TABS
300.0000 mg | ORAL_TABLET | Freq: Two times a day (BID) | ORAL | Status: DC
  Administered 2012-12-03 – 2012-12-05 (×4): 300 mg via ORAL
  Filled 2012-12-03 (×5): qty 2

## 2012-12-03 MED ORDER — HYDROMORPHONE HCL PF 1 MG/ML IJ SOLN
1.0000 mg | INTRAMUSCULAR | Status: DC | PRN
  Administered 2012-12-03: 1 mg via INTRAMUSCULAR
  Filled 2012-12-03 (×2): qty 1

## 2012-12-03 MED ORDER — OLANZAPINE 10 MG PO TABS
20.0000 mg | ORAL_TABLET | Freq: Every day | ORAL | Status: DC
  Administered 2012-12-03 – 2012-12-04 (×2): 20 mg via ORAL
  Filled 2012-12-03 (×3): qty 2

## 2012-12-03 MED ORDER — TRAZODONE HCL 100 MG PO TABS
100.0000 mg | ORAL_TABLET | Freq: Every day | ORAL | Status: DC
  Administered 2012-12-03 – 2012-12-04 (×2): 100 mg via ORAL
  Filled 2012-12-03 (×5): qty 1

## 2012-12-03 MED ORDER — BENZTROPINE MESYLATE 1 MG PO TABS
1.0000 mg | ORAL_TABLET | Freq: Two times a day (BID) | ORAL | Status: DC
  Administered 2012-12-03 – 2012-12-05 (×4): 1 mg via ORAL
  Filled 2012-12-03 (×5): qty 1

## 2012-12-03 MED ORDER — MOMETASONE FURO-FORMOTEROL FUM 100-5 MCG/ACT IN AERO
2.0000 | INHALATION_SPRAY | Freq: Two times a day (BID) | RESPIRATORY_TRACT | Status: DC
  Administered 2012-12-03 – 2012-12-08 (×10): 2 via RESPIRATORY_TRACT
  Filled 2012-12-03: qty 8.8

## 2012-12-03 MED ORDER — ALUM & MAG HYDROXIDE-SIMETH 200-200-20 MG/5ML PO SUSP: 15.0000 mL | ORAL | Status: DC | PRN

## 2012-12-03 MED ORDER — ACETAMINOPHEN 500 MG PO TABS
500.0000 mg | ORAL_TABLET | ORAL | Status: DC | PRN
  Administered 2012-12-05: 500 mg via ORAL
  Filled 2012-12-03: qty 1

## 2012-12-03 MED ORDER — SODIUM CHLORIDE 0.9 % IV SOLN
INTRAVENOUS | Status: DC
  Administered 2012-12-03 – 2012-12-04 (×4): via INTRAVENOUS

## 2012-12-03 MED ORDER — ALBUTEROL SULFATE HFA 108 (90 BASE) MCG/ACT IN AERS
2.0000 | INHALATION_SPRAY | Freq: Four times a day (QID) | RESPIRATORY_TRACT | Status: DC | PRN
  Filled 2012-12-03: qty 6.7

## 2012-12-03 MED ORDER — VITAMIN B-12 1000 MCG PO TABS
1000.0000 ug | ORAL_TABLET | Freq: Every day | ORAL | Status: DC
  Administered 2012-12-03 – 2012-12-08 (×6): 1000 ug via ORAL
  Filled 2012-12-03 (×6): qty 1

## 2012-12-03 MED ORDER — CHLORHEXIDINE GLUCONATE 0.12 % MT SOLN
15.0000 mL | Freq: Two times a day (BID) | OROMUCOSAL | Status: DC
  Administered 2012-12-03 – 2012-12-08 (×10): 15 mL via OROMUCOSAL
  Filled 2012-12-03 (×12): qty 15

## 2012-12-03 MED ORDER — FENOFIBRATE 160 MG PO TABS
160.0000 mg | ORAL_TABLET | Freq: Every day | ORAL | Status: DC
  Administered 2012-12-03 – 2012-12-04 (×2): 160 mg via ORAL
  Filled 2012-12-03 (×3): qty 1

## 2012-12-03 MED ORDER — GUAIFENESIN 100 MG/5ML PO SOLN
10.0000 mL | Freq: Four times a day (QID) | ORAL | Status: DC | PRN
  Filled 2012-12-03: qty 10

## 2012-12-03 NOTE — Progress Notes (Signed)
Pt admitted to the unit at 1545. Pt mental status is alert and oriented to person, place. Pt oriented to room, staff, and call bell. Skin is intact w/ abraions on legs/knees from falling, and bruising on face.. Full assessment charted in [facility]. Call bell within reach. Visitor guidelines reviewed w/ pt and/or family.

## 2012-12-03 NOTE — ED Provider Notes (Signed)
I saw and evaluated the patient, reviewed the resident's note and I agree with the findings and plan.  Pt has had numerous falls.  Fortunately, no sign of significant injury but he does have soft tissue swelling and hematoma.  Pt appears dehydrated.  Will plan on admission for further treatment. ?MRI of brain. Unable to get peripheral IV access.  Will continue with oral fluid hydration.  Will request PICC line.   Celene Kras, MD 12/03/12 [phone]

## 2012-12-03 NOTE — H&P (Signed)
[facility] Note Date: 12/03/2012  Patient name: Derek Hicks Medical record number: [id number] Date of birth: 1951/09/23 Age: 62 y.o. Gender: male PCP: Raynelle Jan., MD  Medical Service: Internal Medicine Teaching Service - Cecille Rubin  Attending physician: Doneen Poisson, MD     Chief Complaint: Increased falls  History of Present Illness:  Derek Hicks is a 62yo M resident of [facility] with h/o PE in 10/2011, HTN, COPD, Bipolar 1 d/o, drug-induced parkinsonism, anxiety, and hypothyroidism presents with frequent falls.  Derek Hicks was markedly somnolent throughout the exam, reducing his ability to give an accurate history.  However, it was able to be ascertained that he has had increased frequency of falls since January.  He was not able to report on the event that brought him into the hospital, saying that he did not remember it happening.  Per ED reports, he has fallen 3x in the past week with increasing LE weakness.  He also has a significant R cheek hematoma, swelling of the R knee and thigh and a L knee abrasion as sequalae of a recent fall.  The ED also reported that he was supposed to have stopped his warfarin tx recently, but this is not consistent with [facility] records.  When talking to the nurse, she reported that he had increased awareness and interaction when he first presented (AAOx4 for her).  She also stated that he was a bit unkempt on arrival with food all over him, and he had reported to her that he is left to feed and care for himself most of the time.  The patient did report epigastric pain.  He endorsed that it had been many days since his last BM, but has not noticed blood in or darkening of his stool.  He also denied blood in urine.    In the ED, PIV was obtained, he was started on IVF and he required 2L [place].   Meds: No current outpatient prescriptions on file.  Allergies: Allergies as of 12/03/2012 - Review Complete 12/03/2012  Allergen Reaction  Noted  . Benzodiazepines Other (See Comments) 11/01/2011  . Other  12/03/2012   Past Medical History  Diagnosis Date  . COPD (chronic obstructive pulmonary disease)   . Pulmonary embolus   . Hypertension   . Bipolar 1 disorder   . Neuroleptic-induced Parkinsonism   . Anxiety   . Hyperlipidemia   . Falls infrequently   . Current use of long term anticoagulation   . Hypothyroidism   . Shortness of breath    Past Surgical History  Procedure Laterality Date  . Rotator cuff repair      x 2    Family History  Problem Relation Age of Onset  . Aneurysm Mother   . Other      No family history of DVT or PE   History   Social History  . Marital Status: Married    Spouse Name: N/A    Number of Children: N/A  . Years of Education: N/A   Occupational History  . Not on file.   Social History Main Topics  . Smoking status: Former Smoker -- 20 years    Quit date: 10/15/2002  . Smokeless tobacco: Never Used  . Alcohol Use: No  . 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 Sexually Active: Not on file   Other Topics Concern  . Not on file   Social History Narrative  .  No narrative on file    Review of Systems: A full review of systems was not performed at admission, due to somnolence of patient.  Pertinent positives and negatives that were able to be ascertained are included above in the HPI.  Physical Exam: Blood pressure 119/82, pulse 102, temperature 98 F (36.7 C), temperature source Oral, resp. rate 20, height 5\' 8"  (1.727 m), weight 104.9 kg (231 lb 4.2 oz), SpO2 96.00%. BP 119/82  Pulse 102  Temp(Src) 98 F (36.7 C) (Oral)  Resp 20  Ht 5\' 8"  (1.727 m)  Wt 104.9 kg (231 lb 4.2 oz)  BMI 35.17 kg/m2  SpO2 96% General appearance: Pt lying in bed with slowed mentation, decreased attention, and increased somnolence.  No acute respiratory distress.  Did not seem to be in acute pain.  Head: Normocephalic, without obvious abnormality, approximate 6in  thin scratch across lateral L forehead and scalp, large hematoma located on anterior R cheek, ecchymosis surrounding R eye Eyes: EOMI, pupils small bilaterally (1-12mm), minimal but equal reaction to L bilaterally, vision intact Throat: Dry mucosal membranes, no pharyngeal erythema Lungs: CTA in anterior lung fields, No wheezes or crackles appreciated Heart: regular rate and rhythm, S1, S2 normal, no murmur, click, rub or gallop Abdomen: Tense, obese, epigastric tenderness, +BS.  No organomegaly appreciated. Extremities: 2+ BL pitting edema, L knee contusion, R knee edema Pulses: 2+ radial and DP pulses Neurologic: MS: Decreased reaction time to questions, had to repeat exam instructions, but understanding is intact. Sensory: Sensation to LT intact in all extremities (although the pt endorsed feeling sensation both while contact with skin was made and without contact) Motor: Grade 2 BLE strength. Grade 4 hand strength.  BL resting tremor in UE.  Lab results: Results for orders placed during the hospital encounter of 12/03/12 (from the past 24 hour(s))  CBC     Status: Abnormal   Collection Time    12/03/12  9:08 AM      Result Value Range   WBC 15.5 (*) 4.0 - 10.5 K/uL   RBC 4.18 (*) 4.22 - 5.81 MIL/uL   Hemoglobin 12.3 (*) 13.0 - 17.0 g/dL   HCT 16.1 (*) 09.6 - 04.5 %   MCV 90.4  78.0 - 100.0 fL   MCH 29.4  26.0 - 34.0 pg   MCHC 32.5  30.0 - 36.0 g/dL   RDW 40.9  81.1 - 91.4 %   Platelets 347  150 - 400 K/uL  COMPREHENSIVE METABOLIC PANEL     Status: Abnormal   Collection Time    12/03/12  9:08 AM      Result Value Range   Sodium 137  135 - 145 mEq/L   Potassium 4.2  3.5 - 5.1 mEq/L   Chloride 101  96 - 112 mEq/L   CO2 23  19 - 32 mEq/L   Glucose, Bld 127 (*) 70 - 99 mg/dL   BUN 23  6 - 23 mg/dL   Creatinine, Ser 7.82 (*) 0.50 - 1.35 mg/dL   Calcium 9.7  8.4 - 95.6 mg/dL   Total Protein 6.9  6.0 - 8.3 g/dL   Albumin 3.3 (*) 3.5 - 5.2 g/dL   AST 69 (*) 0 - 37 U/L   ALT 14  0  - 53 U/L   Alkaline Phosphatase 43  39 - 117 U/L   Total Bilirubin 0.6  0.3 - 1.2 mg/dL   GFR calc non Af Amer 25 (*) >90 mL/min   GFR calc Af [place]  29 (*) >90 mL/min  PRO B NATRIURETIC PEPTIDE     Status: Abnormal   Collection Time    12/03/12  9:08 AM      Result Value Range   Pro B Natriuretic peptide (BNP) 201.0 (*) 0 - 125 pg/mL  LIPASE, BLOOD     Status: None   Collection Time    12/03/12  9:08 AM      Result Value Range   Lipase 39  11 - 59 U/L  PROTIME-INR     Status: Abnormal   Collection Time    12/03/12 11:20 AM      Result Value Range   Prothrombin Time 44.7 (*) 11.6 - 15.2 seconds   INR 5.15 (*) 0.00 - 1.49  APTT     Status: Abnormal   Collection Time    12/03/12 11:20 AM      Result Value Range   aPTT 83 (*) 24 - 37 seconds    Imaging results:  Dg Chest 2 View  12/03/2012  *RADIOLOGY REPORT*  Clinical Data: Fall.  Shortness of breath.  CHEST - 2 VIEW  Comparison: 10/31/2011  Findings: Low lung volumes with bibasilar atelectasis.  Heart is mildly enlarged.  Mild vascular congestion.  Possible small bilateral effusions.  No acute bony abnormality.  Degenerative changes in the shoulders, right greater than left.  IMPRESSION: Low lung volumes with cardiomegaly, vascular congestion and bibasilar atelectasis.  Suspect small effusions.   Original Report Authenticated By: Charlett Nose, M.D.    Dg Pelvis 1-2 Views  12/03/2012  *RADIOLOGY REPORT*  Clinical Data: Fall, right hip pain.  PELVIS - 1-2 VIEW  Comparison: None  Findings: No acute bony abnormality.  Specifically, no fracture, subluxation, or dislocation.  Soft tissues are intact.  SI joints and hip joints are symmetric and unremarkable.  IMPRESSION: No acute bony abnormality.   Original Report Authenticated By: Charlett Nose, M.D.    Dg Femur Right  12/03/2012  *RADIOLOGY REPORT*  Clinical Data: Fall, right proximal femoral pain.  RIGHT FEMUR - 2 VIEW  Comparison: None.  Findings: Soft tissue swelling over the knee  anteriorly. Irregularity noted that along the anterior tibial tuberosity appears well corticated and likely related to old injury.  No visible acute bony abnormality.  No joint effusion within the right knee.  IMPRESSION: Marked soft tissue swelling over the right knee anteriorly.  No acute bony abnormality.   Original Report Authenticated By: Charlett Nose, M.D.    Ct Head Wo Contrast  12/03/2012  *RADIOLOGY REPORT*  Clinical Data:  Recent fall, history of Parkinson's disease, increased spasticity with weakness  CT HEAD WITHOUT CONTRAST CT MAXILLOFACIAL WITHOUT CONTRAST CT CERVICAL SPINE WITHOUT CONTRAST  Technique:  Multidetector CT imaging of the head, cervical spine, and maxillofacial structures were performed using the standard protocol without intravenous contrast. Multiplanar CT image reconstructions of the cervical spine and maxillofacial structures were also generated.  Comparison:  CT brain scan of 08/04/2012  CT HEAD  Findings: The ventricular system remains prominent, and the cortical sulci are prominent consistent with diffuse entry.  The septum is in a normal midline position.  Moderate small vessel ischemic change is stable throughout the periventricular white matter.  No hemorrhage, mass lesion, or acute infarction is seen. There does appear to be a right facial hematoma overlying the zygomatic arch.  IMPRESSION: Atrophy and small vessel disease.  Right facial hematoma.  CT MAXILLOFACIAL  Findings:  A superficial hematoma overlies the right zygomatic arch.  However no fracture of  the zygomatic arch is seen.  The orbital rims also appear intact.  The mandible is intact and mandibular condyles are in normal position.  The nasal bone appears intact. The paranasal sinuses appear intact.  IMPRESSION: No maxillofacial fracture.  Hematoma overlies the right zygomatic arch.  CT CERVICAL SPINE  Findings:   The cervical vertebrae are in normal alignment.  Only mild degenerative disc disease is noted at C5-6  with some loss of disc space and spurring with sclerosis.  The odontoid process is intact.  No cervical spine fracture is seen.  No prevertebral soft tissue swelling is noted.  The thyroid gland is unremarkable.  No adenopathy is seen throughout the neck.  IMPRESSION: Normal alignment with degenerative disc disease at C5-6.  No acute cervical spine fracture.   Original Report Authenticated By: Dwyane Dee, M.D.    Ct Cervical Spine Wo Contrast  12/03/2012  *RADIOLOGY REPORT*  Clinical Data:  Recent fall, history of Parkinson's disease, increased spasticity with weakness  CT HEAD WITHOUT CONTRAST CT MAXILLOFACIAL WITHOUT CONTRAST CT CERVICAL SPINE WITHOUT CONTRAST  Technique:  Multidetector CT imaging of the head, cervical spine, and maxillofacial structures were performed using the standard protocol without intravenous contrast. Multiplanar CT image reconstructions of the cervical spine and maxillofacial structures were also generated.  Comparison:  CT brain scan of 08/04/2012  CT HEAD  Findings: The ventricular system remains prominent, and the cortical sulci are prominent consistent with diffuse entry.  The septum is in a normal midline position.  Moderate small vessel ischemic change is stable throughout the periventricular white matter.  No hemorrhage, mass lesion, or acute infarction is seen. There does appear to be a right facial hematoma overlying the zygomatic arch.  IMPRESSION: Atrophy and small vessel disease.  Right facial hematoma.  CT MAXILLOFACIAL  Findings:  A superficial hematoma overlies the right zygomatic arch.  However no fracture of the zygomatic arch is seen.  The orbital rims also appear intact.  The mandible is intact and mandibular condyles are in normal position.  The nasal bone appears intact. The paranasal sinuses appear intact.  IMPRESSION: No maxillofacial fracture.  Hematoma overlies the right zygomatic arch.  CT CERVICAL SPINE  Findings:   The cervical vertebrae are in normal  alignment.  Only mild degenerative disc disease is noted at C5-6 with some loss of disc space and spurring with sclerosis.  The odontoid process is intact.  No cervical spine fracture is seen.  No prevertebral soft tissue swelling is noted.  The thyroid gland is unremarkable.  No adenopathy is seen throughout the neck.  IMPRESSION: Normal alignment with degenerative disc disease at C5-6.  No acute cervical spine fracture.   Original Report Authenticated By: Dwyane Dee, M.D.    Dg Knee Complete 4 Views Left  12/03/2012  *RADIOLOGY REPORT*  Clinical Data: Fall, knee pain.  LEFT KNEE - COMPLETE 4+ VIEW  Comparison: None  Findings: No acute bony abnormality.  Specifically, no fracture, subluxation, or dislocation.  Soft tissues are intact.  No joint effusion.  IMPRESSION: No acute bony abnormality.   Original Report Authenticated By: Charlett Nose, M.D.    Dg Knee Complete 4 Views Right  12/03/2012  *RADIOLOGY REPORT*  Clinical Data: Fall, knee pain.  RIGHT KNEE - COMPLETE 4+ VIEW  Comparison: None.  Findings: Anterior soft tissue swelling over the patella and the joint.  Well corticated bony densities noted along the anterior tibial tuberosity, likely related to old injury.  No acute fracture, subluxation or dislocation.  No joint effusion.  IMPRESSION: Anterior soft tissue swelling.  No acute bony abnormality.   Original Report Authenticated By: Charlett Nose, M.D.    Ct Maxillofacial Wo Cm  12/03/2012  *RADIOLOGY REPORT*  Clinical Data:  Recent fall, history of Parkinson's disease, increased spasticity with weakness  CT HEAD WITHOUT CONTRAST CT MAXILLOFACIAL WITHOUT CONTRAST CT CERVICAL SPINE WITHOUT CONTRAST  Technique:  Multidetector CT imaging of the head, cervical spine, and maxillofacial structures were performed using the standard protocol without intravenous contrast. Multiplanar CT image reconstructions of the cervical spine and maxillofacial structures were also generated.  Comparison:  CT brain scan of  08/04/2012  CT HEAD  Findings: The ventricular system remains prominent, and the cortical sulci are prominent consistent with diffuse entry.  The septum is in a normal midline position.  Moderate small vessel ischemic change is stable throughout the periventricular white matter.  No hemorrhage, mass lesion, or acute infarction is seen. There does appear to be a right facial hematoma overlying the zygomatic arch.  IMPRESSION: Atrophy and small vessel disease.  Right facial hematoma.  CT MAXILLOFACIAL  Findings:  A superficial hematoma overlies the right zygomatic arch.  However no fracture of the zygomatic arch is seen.  The orbital rims also appear intact.  The mandible is intact and mandibular condyles are in normal position.  The nasal bone appears intact. The paranasal sinuses appear intact.  IMPRESSION: No maxillofacial fracture.  Hematoma overlies the right zygomatic arch.  CT CERVICAL SPINE  Findings:   The cervical vertebrae are in normal alignment.  Only mild degenerative disc disease is noted at C5-6 with some loss of disc space and spurring with sclerosis.  The odontoid process is intact.  No cervical spine fracture is seen.  No prevertebral soft tissue swelling is noted.  The thyroid gland is unremarkable.  No adenopathy is seen throughout the neck.  IMPRESSION: Normal alignment with degenerative disc disease at C5-6.  No acute cervical spine fracture.   Original Report Authenticated By: Dwyane Dee, M.D.     Other results: Rhythm strip: NSR with mild 1st deg AV block; HR 97.  Assessment & Plan by Problem: Mr. Wenke is a 62yo M resident of [facility] with h/o PE (on warfarin therapy), COPD, drug-induced parkinsonism, bipolar disorder, and frequent falls, found to have AMS, a supratherapeutic INR, pulmonary congestion on CXR, and multiple physical sequelae of recent falls on exam (R cheek hematoma, R thigh and knee swelling, L knee abrasion).  1. AKI: Reported baseline is 1.23.  BUN 23 and Cr 2.57  on admission.  Dehydration is noted on exam, so hypoperfusion is likely contributing to AKI.  However, BUN:Cr ratio < 20:1, which raises possibility of intra-renal pathology.  We are following this up with U/A, urine sediment, and urine lytes.  It is possible that acute kidney injury has led to decreased clearance of medications, which could be contributing to his mental status.  We are holding nephrotoxic agents in setting of AKI. - IVF for rehydration - F/u U/A, urine lytes - Strict I/Os - Hold losartan  2. Acute worsening of mental status: This patient does reportedly have a component of neurodegeneration at baseline.  However, his functional status is unclear.  The pt exhibited increased somnolence and decreased attention when examined.  Possible etiologies of this could be medication toxicity from acute worsening of medication clearance at the kidneys or dehydration.  Will check lithium.  Infection cannot be r/o due to elevated WBC of 15.5, but it is  also possible that this is reactionary. Negative CXR makes pneumonia unlikely.  Will f/u U/A for possible UTI.  Hemorrhage or hemorrhagic stroke unlikely with negative imaging.  However, we will reevaluate clinically if he decompensates further.  - IVF to rehydrate - F/u U/A - F/u lithium level - Clinical reevaluation if worsens overnight  3. Falls:  It is hard to characterize this problem with the limited history, but increased falls are likely due to acute worsening of his parkinsonism.  He would benefit from optimization of Parkinsonism medications as an outpatient and a PT/OT evaluation to determine what assistance would be appropriate. Will continue home Parkinsonism medications for now. Due to ED nursing reports, it may also be beneficial to move Mr. Markell to a new nursing home that may be a better fit for his comorbidities. - Continue benztropine - Continue carb/levo - Obtain PT/OT eval - Talk with case management re: nursing home  placement  4. Supratherapeutic INR s/p warfarin therapy for PE:  INR on admission is 5.5.  It is unclear whether the pt is supposed to be on or off warfarin therapy at this time.  From a PE standpoint, it is beneficial to remain on warfarin therapy for 41mo.  However, with the recent increase in falls, it may be a dangerous medicine for him to be on.  Will contact PCP to help decide management strategy.  However, optimization of anti-parkinsonism medications may reduce falls, making therapy with warfarin still an option.  Will d/c warfarin and obtain pharmacy c/s to guide further management. - D/c warfarin for now - Continue to follow INR - Pharmacy c/s, appreciate recommendations  5. HTN: BP running in 90-100s/40-50s.  Will d/c his home BP regimen for now. - Hold amlodipine - Hold losartain - Hold metoprolol  4. COPD: Continue home regimen. - Continue Spiriva - Continue Advair  6. HC: Continue home regimen. - Continue fenofibrate - Continue pravastatin  7. Drug-induced parkinsonism: Continue management as described above in "falls" section.  8. Bipolar d/o: Continue home regimen. - Continue Depakote - Continue lithium - Continue olanzapine - Continue trileptal  9. Hypothyroidism: Continue home regimen. - Continue levothyroxine  10. Sleep disturbance: Continue home regimen. - Continue trazadone 100mg  daily  11. FEN/GI:  - Full liquid diet  12. Dispo: Admit to the floor. Will look into new nursing home placement.  Will need PT/OT evaluation.     This is a Psychologist, occupational Note.  The care of the patient was discussed with Dr. Collier Bullock and the assessment and plan was formulated with their assistance.  Please see their note for official documentation of the patient encounter.   Signed: Wynona Meals Amy 12/03/2012, 4:57 PM

## 2012-12-03 NOTE — H&P (Signed)
Hospital Admission Note Date: 12/03/2012  Patient name: Derek Hicks Medical record number: [id number] Date of birth: December 25, 1950 Age: 62 y.o. Gender: male PCP: Raynelle Jan., MD  _________________________________________________________ INTERNAL MEDICINE TEACHING SERVICE CONTACT INFO     Weekday Hours (7AM-5PM): ** If no return call within 15 minutes (after trying both pagers listed below), please call after hours pagers.    First Contact:  Dr. Collier Bullock   Pager:  [phone] Second Contact:   Dr. Manson Passey   Pager:  [phone]      After Hours (after 5PM)/ Weekend / Holidays: First Contact:              Pager: [phone] Second Contact:         Pager: [phone] _________________________________________________________   Chief Complaint: falling  History of Present Illness:  Derek Hicks is a 62yo M resident of [facility] with h/o PE in 10/2011, HTN, COPD, Bipolar 1 d/o, drug-induced parkinsonism, anxiety, and hypothyroidism presents with frequent falls.   Derek Hicks was markedly somnolent throughout the exam, reducing his ability to give an accurate history. However, it was able to be ascertained that he has had increased frequency of falls since January. He was not able to report on the event that brought him into the hospital, saying that he did not remember it happening. Per ED reports, he has fallen 3x in the past week with increasing LE weakness. He also has a significant R cheek hematoma, swelling of the R knee and thigh and a L knee abrasion as sequalae of a recent fall. The ED also reported that he was supposed to have stopped his warfarin tx recently, but this is not consistent with [facility] records. When talking to the nurse, she reported that he had increased awareness and interaction when he first presented (AAOx4 for her). She also stated that he was unkempt on arrival with food all over him, and he had reported to her that he is left to feed and care for himself most of the time.   The  patient did report epigastric pain. He endorsed that it had been many days since his last BM, but has not noticed blood in or darkening of his stool. He also denied blood in urine.   In the ED, PIV was obtained, he was started on IVF and he required 2L [place].   Denies chest pain, shortness of breath, fever, chills, dysuria, hematuria, melena, BRBPR.  Meds:   Medication List    ASK your doctor about these medications       acetaminophen 500 MG tablet  Commonly known as:  TYLENOL  Take 500 mg by mouth every 4 (four) hours as needed. For pain/fever over 101.     albuterol 108 (90 BASE) MCG/ACT inhaler  Commonly known as:  PROVENTIL HFA;VENTOLIN HFA  Inhale 2 puffs into the lungs every 6 (six) hours as needed. For shortness of breath     amLODipine 5 MG tablet  Commonly known as:  NORVASC  Take 5 mg by mouth daily.     benztropine 1 MG tablet  Commonly known as:  COGENTIN  Take 1 mg by mouth 2 (two) times daily.     carbidopa-levodopa 25-100 MG per tablet  Commonly known as:  SINEMET IR  Take 0.5 tablets by mouth 3 (three) times daily.     divalproex 250 MG 24 hr tablet  Commonly known as:  DEPAKOTE ER  Take 250 mg by mouth daily.     fenofibrate 145 MG  tablet  Commonly known as:  TRICOR  Take 145 mg by mouth daily.     Fluticasone-Salmeterol 250-50 MCG/DOSE Aepb  Commonly known as:  ADVAIR  Inhale 1 puff into the lungs 2 (two) times daily.     furosemide 20 MG tablet  Commonly known as:  LASIX  Take 20 mg by mouth daily.     guaiFENesin 100 MG/5ML Soln  Commonly known as:  ROBITUSSIN  Take 10 mLs by mouth every 6 (six) hours as needed. For cough.     HYDROcodone-acetaminophen 5-500 MG per tablet  Commonly known as:  VICODIN  Take 1 tablet by mouth every 6 (six) hours as needed for pain.     lactulose 10 GM/15ML solution  Commonly known as:  CHRONULAC  Take 20 g by mouth 2 (two) times daily.     levothyroxine 75 MCG tablet  Commonly known as:  SYNTHROID,  LEVOTHROID  Take 75 mcg by mouth daily.     lithium carbonate 300 MG CR tablet  Commonly known as:  LITHOBID  Take 900 mg by mouth at bedtime.     loperamide 2 MG tablet  Commonly known as:  IMODIUM A-D  Take 2 mg by mouth 4 (four) times daily as needed. For diarrhea     losartan 50 MG tablet  Commonly known as:  COZAAR  Take 50 mg by mouth daily.     magnesium hydroxide 400 MG/5ML suspension  Commonly known as:  MILK OF MAGNESIA  Take 30 mLs by mouth daily as needed. For heartburn     metoprolol 100 MG tablet  Commonly known as:  LOPRESSOR  Take 100 mg by mouth 2 (two) times daily.     MI-ACID MAXIMUM STRENGTH PO  Take 30 mLs by mouth 4 (four) times daily as needed. For heartburn     OLANZapine 20 MG tablet  Commonly known as:  ZYPREXA  Take 20 mg by mouth at bedtime.     Oxcarbazepine 300 MG tablet  Commonly known as:  TRILEPTAL  Take 300 mg by mouth 2 (two) times daily.     potassium chloride 10 MEQ tablet  Commonly known as:  K-DUR,KLOR-CON  Take 10 mEq by mouth daily.     pravastatin 20 MG tablet  Commonly known as:  PRAVACHOL  Take 20 mg by mouth daily.     tiotropium 18 MCG inhalation capsule  Commonly known as:  SPIRIVA  Place 18 mcg into inhaler and inhale daily.     traZODone 100 MG tablet  Commonly known as:  DESYREL  Take 100 mg by mouth at bedtime.     vitamin B-12 1000 MCG tablet  Commonly known as:  CYANOCOBALAMIN  Take 1,000 mcg by mouth daily.     warfarin 1 MG tablet  Commonly known as:  COUMADIN  Take 1 mg by mouth 2 (two) times a week. Tuesday and Thursday     warfarin 2.5 MG tablet  Commonly known as:  COUMADIN  Take 2.5 mg by mouth daily. All days except for Tuesday and Thursday        Allergies: Allergies as of 12/03/2012 - Review Complete 12/03/2012  Allergen Reaction Noted  . Benzodiazepines Other (See Comments) 11/01/2011  . Other  12/03/2012   Past Medical History  Diagnosis Date  . COPD (chronic obstructive pulmonary  disease)   . Pulmonary embolus   . Hypertension   . Bipolar 1 disorder   . Neuroleptic-induced Parkinsonism   . Anxiety   .  Hyperlipidemia   . Falls infrequently   . Current use of long term anticoagulation   . Hypothyroidism    No past surgical history on file. Family History  Problem Relation Age of Onset  . Aneurysm Mother   . Other      No family history of DVT or PE   History   Social History  . Marital Status: Married    Spouse Name: N/A    Number of Children: N/A  . Years of Education: N/A   Occupational History  . Not on file.   Social History Main Topics  . Smoking status: Former Smoker -- 20 years  . Smokeless tobacco: Not on file  . Alcohol Use: No  . 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 Sexually Active: Not on file   Other Topics Concern  . Not on file   Social History Narrative  . No narrative on file    Review of Systems: Pertinent items noted in HPI   Physical Exam Blood pressure 105/64, pulse 86, temperature 98.6 F (37 C), resp. rate 15, SpO2 97.00%. General:  No acute distress, resting in bed, sleepy but will open eyes and try to answer questions, oriented to person HEENT:  Pupils 1-2 mm, minimally reactive to light, able to see to count fingers, EOMI, right cheek hematoma, dry tongue with sticky MM, ecchymoses around the right eye Cardiovascular:  Regular rate and rhythm, no murmurs, rubs or gallops Respiratory:  Clear to auscultation bilaterally, no wheezes, rales, or rhonchi Abdomen:  Soft, slightly distended, reported epigastric tenderness, bowel sounds present, no rebound or guarding Extremities:  Warm and well-perfused, 2+ edema, left shin abrasion Skin: Warm, dry, no rashes, good pulses Neuro: Somnolent, opens his eyes to verbal stimuli, answers some questions appropriately with delayed response. No evidence of dysarthria. Resting tremor noted in upper extremities. Strength is difficult to evaluate as patient  has decreased mental status and delayed responses. Moving all 4 extremities spontaneously. 4/5 grip strength but will not raise arms on command. Unable to lift legs off the bed. Sensation to light touch intact throughout.  Lab results: Basic Metabolic Panel:  Recent Labs  40/98/11 0908  NA 137  K 4.2  CL 101  CO2 23  GLUCOSE 127*  BUN 23  CREATININE 2.57*  CALCIUM 9.7   Liver Function Tests:  Recent Labs  12/03/12 0908  AST 69*  ALT 14  ALKPHOS 43  BILITOT 0.6  PROT 6.9  ALBUMIN 3.3*   CBC:  Recent Labs  12/03/12 0908  WBC 15.5*  HGB 12.3*  HCT 37.8*  MCV 90.4  PLT 347   BNP:  Recent Labs  12/03/12 0908  PROBNP 201.0*   Coagulation:  Recent Labs  12/03/12 1120  LABPROT 44.7*  INR 5.15*   Urine Drug Screen: Drugs of Abuse     Component Value Date/Time   LABOPIA NONE DETECTED 07/15/2012 0503   COCAINSCRNUR NONE DETECTED 07/15/2012 0503   LABBENZ NONE DETECTED 07/15/2012 0503   AMPHETMU NONE DETECTED 07/15/2012 0503   THCU NONE DETECTED 07/15/2012 0503   LABBARB NONE DETECTED 07/15/2012 0503     Imaging results:  Dg Chest 2 View  12/03/2012  *RADIOLOGY REPORT*  Clinical Data: Fall.  Shortness of breath.  CHEST - 2 VIEW  Comparison: 10/31/2011  Findings: Low lung volumes with bibasilar atelectasis.  Heart is mildly enlarged.  Mild vascular congestion.  Possible small bilateral effusions.  No acute  bony abnormality.  Degenerative changes in the shoulders, right greater than left.  IMPRESSION: Low lung volumes with cardiomegaly, vascular congestion and bibasilar atelectasis.  Suspect small effusions.   Original Report Authenticated By: Charlett Nose, M.D.    Dg Pelvis 1-2 Views  12/03/2012  *RADIOLOGY REPORT*  Clinical Data: Fall, right hip pain.  PELVIS - 1-2 VIEW  Comparison: None  Findings: No acute bony abnormality.  Specifically, no fracture, subluxation, or dislocation.  Soft tissues are intact.  SI joints and hip joints are symmetric and unremarkable.   IMPRESSION: No acute bony abnormality.   Original Report Authenticated By: Charlett Nose, M.D.    Dg Femur Right  12/03/2012  *RADIOLOGY REPORT*  Clinical Data: Fall, right proximal femoral pain.  RIGHT FEMUR - 2 VIEW  Comparison: None.  Findings: Soft tissue swelling over the knee anteriorly. Irregularity noted that along the anterior tibial tuberosity appears well corticated and likely related to old injury.  No visible acute bony abnormality.  No joint effusion within the right knee.  IMPRESSION: Marked soft tissue swelling over the right knee anteriorly.  No acute bony abnormality.   Original Report Authenticated By: Charlett Nose, M.D.    Ct Head Wo Contrast  12/03/2012  *RADIOLOGY REPORT*  Clinical Data:  Recent fall, history of Parkinson's disease, increased spasticity with weakness  CT HEAD WITHOUT CONTRAST CT MAXILLOFACIAL WITHOUT CONTRAST CT CERVICAL SPINE WITHOUT CONTRAST  Technique:  Multidetector CT imaging of the head, cervical spine, and maxillofacial structures were performed using the standard protocol without intravenous contrast. Multiplanar CT image reconstructions of the cervical spine and maxillofacial structures were also generated.  Comparison:  CT brain scan of 08/04/2012  CT HEAD  Findings: The ventricular system remains prominent, and the cortical sulci are prominent consistent with diffuse entry.  The septum is in a normal midline position.  Moderate small vessel ischemic change is stable throughout the periventricular white matter.  No hemorrhage, mass lesion, or acute infarction is seen. There does appear to be a right facial hematoma overlying the zygomatic arch.  IMPRESSION: Atrophy and small vessel disease.  Right facial hematoma.  CT MAXILLOFACIAL  Findings:  A superficial hematoma overlies the right zygomatic arch.  However no fracture of the zygomatic arch is seen.  The orbital rims also appear intact.  The mandible is intact and mandibular condyles are in normal position.  The  nasal bone appears intact. The paranasal sinuses appear intact.  IMPRESSION: No maxillofacial fracture.  Hematoma overlies the right zygomatic arch.  CT CERVICAL SPINE  Findings:   The cervical vertebrae are in normal alignment.  Only mild degenerative disc disease is noted at C5-6 with some loss of disc space and spurring with sclerosis.  The odontoid process is intact.  No cervical spine fracture is seen.  No prevertebral soft tissue swelling is noted.  The thyroid gland is unremarkable.  No adenopathy is seen throughout the neck.  IMPRESSION: Normal alignment with degenerative disc disease at C5-6.  No acute cervical spine fracture.   Original Report Authenticated By: Dwyane Dee, M.D.    Ct Cervical Spine Wo Contrast  12/03/2012  *RADIOLOGY REPORT*  Clinical Data:  Recent fall, history of Parkinson's disease, increased spasticity with weakness  CT HEAD WITHOUT CONTRAST CT MAXILLOFACIAL WITHOUT CONTRAST CT CERVICAL SPINE WITHOUT CONTRAST  Technique:  Multidetector CT imaging of the head, cervical spine, and maxillofacial structures were performed using the standard protocol without intravenous contrast. Multiplanar CT image reconstructions of the cervical spine and maxillofacial structures were  also generated.  Comparison:  CT brain scan of 08/04/2012  CT HEAD  Findings: The ventricular system remains prominent, and the cortical sulci are prominent consistent with diffuse entry.  The septum is in a normal midline position.  Moderate small vessel ischemic change is stable throughout the periventricular white matter.  No hemorrhage, mass lesion, or acute infarction is seen. There does appear to be a right facial hematoma overlying the zygomatic arch.  IMPRESSION: Atrophy and small vessel disease.  Right facial hematoma.  CT MAXILLOFACIAL  Findings:  A superficial hematoma overlies the right zygomatic arch.  However no fracture of the zygomatic arch is seen.  The orbital rims also appear intact.  The mandible is  intact and mandibular condyles are in normal position.  The nasal bone appears intact. The paranasal sinuses appear intact.  IMPRESSION: No maxillofacial fracture.  Hematoma overlies the right zygomatic arch.  CT CERVICAL SPINE  Findings:   The cervical vertebrae are in normal alignment.  Only mild degenerative disc disease is noted at C5-6 with some loss of disc space and spurring with sclerosis.  The odontoid process is intact.  No cervical spine fracture is seen.  No prevertebral soft tissue swelling is noted.  The thyroid gland is unremarkable.  No adenopathy is seen throughout the neck.  IMPRESSION: Normal alignment with degenerative disc disease at C5-6.  No acute cervical spine fracture.   Original Report Authenticated By: Dwyane Dee, M.D.    Dg Knee Complete 4 Views Left  12/03/2012  *RADIOLOGY REPORT*  Clinical Data: Fall, knee pain.  LEFT KNEE - COMPLETE 4+ VIEW  Comparison: None  Findings: No acute bony abnormality.  Specifically, no fracture, subluxation, or dislocation.  Soft tissues are intact.  No joint effusion.  IMPRESSION: No acute bony abnormality.   Original Report Authenticated By: Charlett Nose, M.D.    Dg Knee Complete 4 Views Right  12/03/2012  *RADIOLOGY REPORT*  Clinical Data: Fall, knee pain.  RIGHT KNEE - COMPLETE 4+ VIEW  Comparison: None.  Findings: Anterior soft tissue swelling over the patella and the joint.  Well corticated bony densities noted along the anterior tibial tuberosity, likely related to old injury.  No acute fracture, subluxation or dislocation.  No joint effusion.  IMPRESSION: Anterior soft tissue swelling.  No acute bony abnormality.   Original Report Authenticated By: Charlett Nose, M.D.    Ct Maxillofacial Wo Cm  12/03/2012  *RADIOLOGY REPORT*  Clinical Data:  Recent fall, history of Parkinson's disease, increased spasticity with weakness  CT HEAD WITHOUT CONTRAST CT MAXILLOFACIAL WITHOUT CONTRAST CT CERVICAL SPINE WITHOUT CONTRAST  Technique:  Multidetector CT  imaging of the head, cervical spine, and maxillofacial structures were performed using the standard protocol without intravenous contrast. Multiplanar CT image reconstructions of the cervical spine and maxillofacial structures were also generated.  Comparison:  CT brain scan of 08/04/2012  CT HEAD  Findings: The ventricular system remains prominent, and the cortical sulci are prominent consistent with diffuse entry.  The septum is in a normal midline position.  Moderate small vessel ischemic change is stable throughout the periventricular white matter.  No hemorrhage, mass lesion, or acute infarction is seen. There does appear to be a right facial hematoma overlying the zygomatic arch.  IMPRESSION: Atrophy and small vessel disease.  Right facial hematoma.  CT MAXILLOFACIAL  Findings:  A superficial hematoma overlies the right zygomatic arch.  However no fracture of the zygomatic arch is seen.  The orbital rims also appear intact.  The mandible  is intact and mandibular condyles are in normal position.  The nasal bone appears intact. The paranasal sinuses appear intact.  IMPRESSION: No maxillofacial fracture.  Hematoma overlies the right zygomatic arch.  CT CERVICAL SPINE  Findings:   The cervical vertebrae are in normal alignment.  Only mild degenerative disc disease is noted at C5-6 with some loss of disc space and spurring with sclerosis.  The odontoid process is intact.  No cervical spine fracture is seen.  No prevertebral soft tissue swelling is noted.  The thyroid gland is unremarkable.  No adenopathy is seen throughout the neck.  IMPRESSION: Normal alignment with degenerative disc disease at C5-6.  No acute cervical spine fracture.   Original Report Authenticated By: Dwyane Dee, M.D.      Assessment & Plan by Problem: Principal Problem:   AKI (acute kidney injury) Active Problems:   Pulmonary embolism   Anxiety state   COPD (chronic obstructive pulmonary disease)   Dementia in Parkinson's disease    Neuroleptic-induced Parkinsonism   Falls infrequently   Supratherapeutic INR  Derek Hicks is a 62yo M resident of [facility] with h/o PE in 10/2011, HTN, COPD, Bipolar 1 d/o, drug-induced parkinsonism, anxiety, and hypothyroidism presents with frequent falls and altered mental status  Acute Renal Injury Patient presented with Cr elevated to 2.57, baseline is ~1.2.  Exam shows dehydration sugging prerenal but BUN is 23. Possible medication-induced renal injury. Decreased cr clearance may be contributing to patient's altered mental status by increasing the serum concentration of psych medications.  -admit to med surg -IVFs at 150cc/h -strict I/Os -monitor daily BMP -UA, FENA -Hold Lasix, losartan  Acute worsening of mental status Patient has Parkinson-type neurodegenerative disorder and has baseline mental deficits, however, no family is present to assist with defining patient's baseline. He is currently living in a facility. On exam, he has increased somnolence and disorientation. Delayed responses likely due Parkinson's. No obvious infectious cause of altered mental status, though patient not able to give accurate history and has an elevated white count.. No evidence of PNA on CXR. Could have UTI. No neck stiffness to suggest meningitis. Acute renal failure may be a cause of worsening AMS as well. Patient is on many home medications including narcotics and psychiatric medications that may be contributing to altered mental status. Polypharmacy is a concern. Considered acute stroke, however, ischemic stroke less likely as patient's INR is 5 on admission and head CT negative for acute hemorrhagic stroke.  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  -hold Coumadin, monitor  INR -coumadin dosing per pharmacy  Recent falls No fractures noted on plain films, CT spine, face, or CT head. Patient does have hematoma and ecchymosis on face and abrasion on lower extremity. Falls may be from worsening parkinsonism but may also be from acute infection or from AKI. -cont benztropine, levodopa -PT/OT  COPD -Continue home dulera, Spiriva, PRN albuterol,   Parkinsonism Patient with medication induced parkinsonism -Continue Sinemet and Cogentin  Bipolar disorder Continue home meds for now  DVT -pt supra therapeutic on coumadin on admission  Dispo -anticipate dc in 2-4 days -will need placement in facility    Signed: Denton Ar 12/03/2012, 1:26 PM

## 2012-12-03 NOTE — ED Notes (Signed)
Pt remains in CT and Xray.

## 2012-12-03 NOTE — ED Provider Notes (Signed)
History     CSN: [id number]  Arrival date & time 12/03/12  0830   First MD Initiated Contact with Patient 12/03/12 [phone]      Chief Complaint  Patient presents with  . Fall   HPI Pt is a 62 yo M from [address] presenting s/p frequent falls. He has PMH of PE (previously on Coumadin, recently d/c), Bipolar 1, Parkinsonism, COPD. He is somewhat of a poor historian, but patient states he has had 3 falls recently with increasing weakness in the last 5 days. Patient typically gets around in a wheelchair or with a walker. He has a large hematoma on his right cheek which he states is from his face hitting the floor, he denies LOC from that but does not remember when he fell. He also has swelling and tenderness of his right knee and thigh, as well as an abrasion of his left knee and quarter size abrasion of his left thigh.  Patient is reporting back pain and SOB at this time. He does not wear O2 at home but had sat of 88% after transfer to bed and is currently on 2L with audible wheezing.   Past Medical History  Diagnosis Date  . COPD (chronic obstructive pulmonary disease)   . Pulmonary embolus   . Hypertension   . Bipolar 1 disorder   . Neuroleptic-induced Parkinsonism   . Anxiety   . Hyperlipidemia   . Falls infrequently   . Current use of long term anticoagulation   . Hypothyroidism     No past surgical history on file.  Family History  Problem Relation Age of Onset  . Aneurysm Mother   . Other      No family history of DVT or PE    History  Substance Use Topics  . Smoking status: Former Smoker -- 20 years  . Smokeless tobacco: Not on file  . Alcohol Use: No    Review of Systems  Constitutional: Negative for fever.  Respiratory: Positive for shortness of breath.   Cardiovascular: Negative for chest pain.  Gastrointestinal: Negative for abdominal pain.  Musculoskeletal: Positive for back pain, joint swelling and arthralgias.  Skin: Positive for wound.  All other  systems reviewed and are negative.    Allergies  Benzodiazepines and Other  Home Medications   Current Outpatient Rx  Name  Route  Sig  Dispense  Refill  . acetaminophen (TYLENOL) 500 MG tablet   Oral   Take 500 mg by mouth every 4 (four) hours as needed. For pain/fever over 101.         . albuterol (PROVENTIL HFA;VENTOLIN HFA) 108 (90 BASE) MCG/ACT inhaler   Inhalation   Inhale 2 puffs into the lungs every 6 (six) hours as needed. For shortness of breath         . Alum & Mag Hydroxide-Simeth (MI-ACID MAXIMUM STRENGTH PO)   Oral   Take 30 mLs by mouth 4 (four) times daily as needed. For heartburn         . amLODipine (NORVASC) 5 MG tablet   Oral   Take 5 mg by mouth daily.         . benztropine (COGENTIN) 1 MG tablet   Oral   Take 1 mg by mouth 2 (two) times daily.         . carbidopa-levodopa (SINEMET IR) 25-100 MG per tablet   Oral   Take 0.5 tablets by mouth 3 (three) times daily.         Marland Kitchen  divalproex (DEPAKOTE ER) 250 MG 24 hr tablet   Oral   Take 250 mg by mouth daily.         . fenofibrate (TRICOR) 145 MG tablet   Oral   Take 145 mg by mouth daily.         . Fluticasone-Salmeterol (ADVAIR) 250-50 MCG/DOSE AEPB   Inhalation   Inhale 1 puff into the lungs 2 (two) times daily.         . furosemide (LASIX) 20 MG tablet   Oral   Take 20 mg by mouth daily.         Marland Kitchen guaiFENesin (ROBITUSSIN) 100 MG/5ML SOLN   Oral   Take 10 mLs by mouth every 6 (six) hours as needed. For cough.         Marland Kitchen HYDROcodone-acetaminophen (VICODIN) 5-500 MG per tablet   Oral   Take 1 tablet by mouth every 6 (six) hours as needed for pain.         Marland Kitchen lactulose (CHRONULAC) 10 GM/15ML solution   Oral   Take 20 g by mouth 2 (two) times daily.         Marland Kitchen levothyroxine (SYNTHROID, LEVOTHROID) 75 MCG tablet   Oral   Take 75 mcg by mouth daily.         Marland Kitchen lithium carbonate (LITHOBID) 300 MG CR tablet   Oral   Take 900 mg by mouth at bedtime.         Marland Kitchen  loperamide (IMODIUM A-D) 2 MG tablet   Oral   Take 2 mg by mouth 4 (four) times daily as needed. For diarrhea         . losartan (COZAAR) 50 MG tablet   Oral   Take 50 mg by mouth daily.         . magnesium hydroxide (MILK OF MAGNESIA) 400 MG/5ML suspension   Oral   Take 30 mLs by mouth daily as needed. For heartburn         . metoprolol (LOPRESSOR) 100 MG tablet   Oral   Take 100 mg by mouth 2 (two) times daily.         Marland Kitchen OLANZapine (ZYPREXA) 20 MG tablet   Oral   Take 20 mg by mouth at bedtime.         . Oxcarbazepine (TRILEPTAL) 300 MG tablet   Oral   Take 300 mg by mouth 2 (two) times daily.         . potassium chloride (K-DUR,KLOR-CON) 10 MEQ tablet   Oral   Take 10 mEq by mouth daily.         . pravastatin (PRAVACHOL) 20 MG tablet   Oral   Take 20 mg by mouth daily.         Marland Kitchen tiotropium (SPIRIVA) 18 MCG inhalation capsule   Inhalation   Place 18 mcg into inhaler and inhale daily.         . traZODone (DESYREL) 100 MG tablet   Oral   Take 100 mg by mouth at bedtime.         . vitamin B-12 (CYANOCOBALAMIN) 1000 MCG tablet   Oral   Take 1,000 mcg by mouth daily.         Marland Kitchen warfarin (COUMADIN) 1 MG tablet   Oral   Take 1 mg by mouth 2 (two) times a week. Tuesday and Thursday         . warfarin (COUMADIN) 2.5 MG tablet   Oral  Take 2.5 mg by mouth daily. All days except for Tuesday and Thursday         . EXPIRED: furosemide (LASIX) 20 MG tablet   Oral   Take 1 tablet (20 mg total) by mouth daily.   30 tablet   0   . EXPIRED: potassium chloride (K-DUR,KLOR-CON) 10 MEQ tablet   Oral   Take 1 tablet (10 mEq total) by mouth daily.   30 tablet   0   . EXPIRED: tiotropium (SPIRIVA) 18 MCG inhalation capsule   Inhalation   Place 1 capsule (18 mcg total) into inhaler and inhale daily.   30 capsule   0     BP 105/64  Pulse 78  Temp(Src) 98.6 F (37 C)  Resp 19  SpO2 93%  Physical Exam  Constitutional: He appears  well-developed and well-nourished.  Appears uncomfortable. Audible wheezing.  HENT:  Head: Normocephalic and atraumatic.  Mouth/Throat: Oropharynx is clear and moist. No oropharyngeal exudate.  Large hematoma right cheek and orbit with old bruising and notable edema. 2 cm minor cut on left upper forehead at hairline  Eyes: Conjunctivae and EOM are normal. Pupils are equal, round, and reactive to light.  Cardiovascular: Normal rate and regular rhythm.   Pulmonary/Chest: Accessory muscle usage present. He has no decreased breath sounds (Diffusely decreased, with decreased effort as well). He has no wheezes. He has rales (Upper lobes).  Abdominal: Soft. He exhibits no distension. There is no tenderness.  Musculoskeletal:  No cervical spine tenderness. Reported back pain, but unable to fully exam due to condition. Edema of right knee and thigh. Decreased ROM of right knee. Hips stable, but patient reports pain on exam.  Lymphadenopathy:    He has no cervical adenopathy.  Neurological: He is alert.  Oriented to person, place, year. Persistent tremor of hands. Slow to answer questions.  Skin:  Abrasion of left knee with active bleeding. Round abrasion of left thigh with surrounding ecchymosis.    ED Course  Procedures (including critical care time)  Labs Reviewed  CBC - Abnormal; Notable for the following:    WBC 15.5 (*)    RBC 4.18 (*)    Hemoglobin 12.3 (*)    HCT 37.8 (*)    All other components within normal limits  COMPREHENSIVE METABOLIC PANEL - Abnormal; Notable for the following:    Glucose, Bld 127 (*)    Creatinine, Ser 2.57 (*)    Albumin 3.3 (*)    AST 69 (*)    GFR calc non Af Amer 25 (*)    GFR calc Af Amer 29 (*)    All other components within normal limits  PRO B NATRIURETIC PEPTIDE - Abnormal; Notable for the following:    Pro B Natriuretic peptide (BNP) 201.0 (*)    All other components within normal limits  PROTIME-INR - Abnormal; Notable for the following:     Prothrombin Time 44.7 (*)    INR 5.15 (*)    All other components within normal limits  APTT - Abnormal; Notable for the following:    aPTT 83 (*)    All other components within normal limits  URINALYSIS, ROUTINE W REFLEX MICROSCOPIC   Dg Chest 2 View  12/03/2012  *RADIOLOGY REPORT*  Clinical Data: Fall.  Shortness of breath.  CHEST - 2 VIEW  Comparison: 10/31/2011  Findings: Low lung volumes with bibasilar atelectasis.  Heart is mildly enlarged.  Mild vascular congestion.  Possible small bilateral effusions.  No acute bony abnormality.  Degenerative changes in the shoulders, right greater than left.  IMPRESSION: Low lung volumes with cardiomegaly, vascular congestion and bibasilar atelectasis.  Suspect small effusions.   Original Report Authenticated By: Charlett Nose, M.D.    Dg Pelvis 1-2 Views  12/03/2012  *RADIOLOGY REPORT*  Clinical Data: Fall, right hip pain.  PELVIS - 1-2 VIEW  Comparison: None  Findings: No acute bony abnormality.  Specifically, no fracture, subluxation, or dislocation.  Soft tissues are intact.  SI joints and hip joints are symmetric and unremarkable.  IMPRESSION: No acute bony abnormality.   Original Report Authenticated By: Charlett Nose, M.D.    Dg Femur Right  12/03/2012  *RADIOLOGY REPORT*  Clinical Data: Fall, right proximal femoral pain.  RIGHT FEMUR - 2 VIEW  Comparison: None.  Findings: Soft tissue swelling over the knee anteriorly. Irregularity noted that along the anterior tibial tuberosity appears well corticated and likely related to old injury.  No visible acute bony abnormality.  No joint effusion within the right knee.  IMPRESSION: Marked soft tissue swelling over the right knee anteriorly.  No acute bony abnormality.   Original Report Authenticated By: Charlett Nose, M.D.    Ct Head Wo Contrast  12/03/2012  *RADIOLOGY REPORT*  Clinical Data:  Recent fall, history of Parkinson's disease, increased spasticity with weakness  CT HEAD WITHOUT CONTRAST CT  MAXILLOFACIAL WITHOUT CONTRAST CT CERVICAL SPINE WITHOUT CONTRAST  Technique:  Multidetector CT imaging of the head, cervical spine, and maxillofacial structures were performed using the standard protocol without intravenous contrast. Multiplanar CT image reconstructions of the cervical spine and maxillofacial structures were also generated.  Comparison:  CT brain scan of 08/04/2012  CT HEAD  Findings: The ventricular system remains prominent, and the cortical sulci are prominent consistent with diffuse entry.  The septum is in a normal midline position.  Moderate small vessel ischemic change is stable throughout the periventricular white matter.  No hemorrhage, mass lesion, or acute infarction is seen. There does appear to be a right facial hematoma overlying the zygomatic arch.  IMPRESSION: Atrophy and small vessel disease.  Right facial hematoma.  CT MAXILLOFACIAL  Findings:  A superficial hematoma overlies the right zygomatic arch.  However no fracture of the zygomatic arch is seen.  The orbital rims also appear intact.  The mandible is intact and mandibular condyles are in normal position.  The nasal bone appears intact. The paranasal sinuses appear intact.  IMPRESSION: No maxillofacial fracture.  Hematoma overlies the right zygomatic arch.  CT CERVICAL SPINE  Findings:   The cervical vertebrae are in normal alignment.  Only mild degenerative disc disease is noted at C5-6 with some loss of disc space and spurring with sclerosis.  The odontoid process is intact.  No cervical spine fracture is seen.  No prevertebral soft tissue swelling is noted.  The thyroid gland is unremarkable.  No adenopathy is seen throughout the neck.  IMPRESSION: Normal alignment with degenerative disc disease at C5-6.  No acute cervical spine fracture.   Original Report Authenticated By: Dwyane Dee, M.D.    Ct Cervical Spine Wo Contrast  12/03/2012  *RADIOLOGY REPORT*  Clinical Data:  Recent fall, history of Parkinson's disease,  increased spasticity with weakness  CT HEAD WITHOUT CONTRAST CT MAXILLOFACIAL WITHOUT CONTRAST CT CERVICAL SPINE WITHOUT CONTRAST  Technique:  Multidetector CT imaging of the head, cervical spine, and maxillofacial structures were performed using the standard protocol without intravenous contrast. Multiplanar CT image reconstructions of the cervical spine and maxillofacial structures were also generated.  Comparison:  CT brain scan of 08/04/2012  CT HEAD  Findings: The ventricular system remains prominent, and the cortical sulci are prominent consistent with diffuse entry.  The septum is in a normal midline position.  Moderate small vessel ischemic change is stable throughout the periventricular white matter.  No hemorrhage, mass lesion, or acute infarction is seen. There does appear to be a right facial hematoma overlying the zygomatic arch.  IMPRESSION: Atrophy and small vessel disease.  Right facial hematoma.  CT MAXILLOFACIAL  Findings:  A superficial hematoma overlies the right zygomatic arch.  However no fracture of the zygomatic arch is seen.  The orbital rims also appear intact.  The mandible is intact and mandibular condyles are in normal position.  The nasal bone appears intact. The paranasal sinuses appear intact.  IMPRESSION: No maxillofacial fracture.  Hematoma overlies the right zygomatic arch.  CT CERVICAL SPINE  Findings:   The cervical vertebrae are in normal alignment.  Only mild degenerative disc disease is noted at C5-6 with some loss of disc space and spurring with sclerosis.  The odontoid process is intact.  No cervical spine fracture is seen.  No prevertebral soft tissue swelling is noted.  The thyroid gland is unremarkable.  No adenopathy is seen throughout the neck.  IMPRESSION: Normal alignment with degenerative disc disease at C5-6.  No acute cervical spine fracture.   Original Report Authenticated By: Dwyane Dee, M.D.    Dg Knee Complete 4 Views Left  12/03/2012  *RADIOLOGY REPORT*   Clinical Data: Fall, knee pain.  LEFT KNEE - COMPLETE 4+ VIEW  Comparison: None  Findings: No acute bony abnormality.  Specifically, no fracture, subluxation, or dislocation.  Soft tissues are intact.  No joint effusion.  IMPRESSION: No acute bony abnormality.   Original Report Authenticated By: Charlett Nose, M.D.    Dg Knee Complete 4 Views Right  12/03/2012  *RADIOLOGY REPORT*  Clinical Data: Fall, knee pain.  RIGHT KNEE - COMPLETE 4+ VIEW  Comparison: None.  Findings: Anterior soft tissue swelling over the patella and the joint.  Well corticated bony densities noted along the anterior tibial tuberosity, likely related to old injury.  No acute fracture, subluxation or dislocation.  No joint effusion.  IMPRESSION: Anterior soft tissue swelling.  No acute bony abnormality.   Original Report Authenticated By: Charlett Nose, M.D.    Ct Maxillofacial Wo Cm  12/03/2012  *RADIOLOGY REPORT*  Clinical Data:  Recent fall, history of Parkinson's disease, increased spasticity with weakness  CT HEAD WITHOUT CONTRAST CT MAXILLOFACIAL WITHOUT CONTRAST CT CERVICAL SPINE WITHOUT CONTRAST  Technique:  Multidetector CT imaging of the head, cervical spine, and maxillofacial structures were performed using the standard protocol without intravenous contrast. Multiplanar CT image reconstructions of the cervical spine and maxillofacial structures were also generated.  Comparison:  CT brain scan of 08/04/2012  CT HEAD  Findings: The ventricular system remains prominent, and the cortical sulci are prominent consistent with diffuse entry.  The septum is in a normal midline position.  Moderate small vessel ischemic change is stable throughout the periventricular white matter.  No hemorrhage, mass lesion, or acute infarction is seen. There does appear to be a right facial hematoma overlying the zygomatic arch.  IMPRESSION: Atrophy and small vessel disease.  Right facial hematoma.  CT MAXILLOFACIAL  Findings:  A superficial hematoma  overlies the right zygomatic arch.  However no fracture of the zygomatic arch is seen.  The orbital rims also appear intact.  The mandible is intact and  mandibular condyles are in normal position.  The nasal bone appears intact. The paranasal sinuses appear intact.  IMPRESSION: No maxillofacial fracture.  Hematoma overlies the right zygomatic arch.  CT CERVICAL SPINE  Findings:   The cervical vertebrae are in normal alignment.  Only mild degenerative disc disease is noted at C5-6 with some loss of disc space and spurring with sclerosis.  The odontoid process is intact.  No cervical spine fracture is seen.  No prevertebral soft tissue swelling is noted.  The thyroid gland is unremarkable.  No adenopathy is seen throughout the neck.  IMPRESSION: Normal alignment with degenerative disc disease at C5-6.  No acute cervical spine fracture.   Original Report Authenticated By: Dwyane Dee, M.D.      1. Falls   2. Elevated INR   3. ARF (acute renal failure)     MDM  62 yo M with increasing weakness and frequent falls.  Pt seen and examined with nursing staff. Given his facial trauma, will get head and maxillofacial CT scans as well as C-spine. Will get X-rays of knees bilaterally due to trauma, left femur due to swelling and pain, and pelvis since he has fallen on his knees multiple pain. Since patient has PMH of COPD and is now requiring O2, will get CXR and BNP. He has PMH of PE and was recently taken off Coumadin, but given his falls will check CBC, coags and Cmet. Will treat his pain with IM Dilaudid.  1047- Labs reviewed. WBC elevated to 15.5 and Creatinine elevated above baseline to 2.57. Will given IVF and await rest of workup.  1210- CXR shows atelectasis and vascular congestion. Other X-rays and CT scans unremarkable. INR >5 at this time. Will call for admission given his frequent falls, ARF, pain and supertherapeutic INR. Call placed for unassigned. Spoke with IM teaching service resident who will  come see patient and admit to hospital. Pt agrees with this plan.   Hilarie Fredrickson, MD 12/03/12 1213

## 2012-12-03 NOTE — ED Notes (Signed)
To ED via [facility]  [facility]-- from [facility]) -- hx Parkinson's - states spasticity has increased as well as weakness since approx Friday. On arrival-- pt alert, has large hematoma to right cheek and orbit area, old bruising- green and purple in color. Bruising on both knees, area on lateral left thigh quarter size open wound with bruising around.

## 2012-12-03 NOTE — ED Notes (Signed)
IV team notified patient will need PICC line placed. States they will be down to attempt line placement ASAP.

## 2012-12-03 NOTE — Progress Notes (Signed)
ANTICOAGULATION CONSULT NOTE - Initial Consult  Pharmacy Consult for Coumadin Indication: VTE prophylaxis  Allergies  Allergen Reactions  . Benzodiazepines Other (See Comments)    Causes aggression, paradoxical reaction  . Other     Anti-anxiety medications    Patient Measurements:    Vital Signs: Temp: 98.6 F (37 C) (02/19 0848) BP: 109/68 mmHg (02/19 1359) Pulse Rate: 87 (02/19 1359)  Labs:  Recent Labs  12/03/12 0908 12/03/12 1120  HGB 12.3*  --   HCT 37.8*  --   PLT 347  --   APTT  --  83*  LABPROT  --  44.7*  INR  --  5.15*  CREATININE 2.57*  --     The CrCl is unknown because both a height and weight (above a minimum accepted value) are required for this calculation.   Medical History: Past Medical History  Diagnosis Date  . COPD (chronic obstructive pulmonary disease)   . Pulmonary embolus   . Hypertension   . Bipolar 1 disorder   . Neuroleptic-induced Parkinsonism   . Anxiety   . Hyperlipidemia   . Falls infrequently   . Current use of long term anticoagulation   . Hypothyroidism     Medications:  Extensive NH MAR med list, includes: Coumadin 1mg  twice a week (Tue/Thu) and 2.5mg  other days  Assessment: Derek Hicks is a NH resident with hx of PE who presents s/p fall. Apparently he has had frequent falls, but does not remember falling. Noted patient is on chronic Coumadin for VTE prophylaxis, and is currently supratherapeutic (INR 5.15). Reviewed NH MAR, no obvious interacting medications noted, but high INR may be attributable to the fact that the patient may not have been eating as much PTA. H/H are at the low end of nml, plts are OK at baseline. No overt bleeding is reported, but noted to have a hematoma on his R cheek d/t falls.  No reversal agents noted.  Goal of Therapy:  INR 2-3 Monitor platelets by anticoagulation protocol: Yes   Plan:  - No Coumadin today - Will check daily PT/INR and CBC at least once a week - Will f/up clinical  status daily  Thanks, Dotti Busey K. Allena Katz, PharmD, BCPS.  Clinical Pharmacist Pager [phone]. 12/03/2012 2:21 PM

## 2012-12-04 DIAGNOSIS — F411 Generalized anxiety disorder: Secondary | ICD-10-CM

## 2012-12-04 DIAGNOSIS — J449 Chronic obstructive pulmonary disease, unspecified: Secondary | ICD-10-CM

## 2012-12-04 DIAGNOSIS — G219 Secondary parkinsonism, unspecified: Secondary | ICD-10-CM

## 2012-12-04 DIAGNOSIS — I10 Essential (primary) hypertension: Secondary | ICD-10-CM

## 2012-12-04 DIAGNOSIS — F0280 Dementia in other diseases classified elsewhere without behavioral disturbance: Secondary | ICD-10-CM

## 2012-12-04 DIAGNOSIS — I2699 Other pulmonary embolism without acute cor pulmonale: Secondary | ICD-10-CM

## 2012-12-04 LAB — COMPREHENSIVE METABOLIC PANEL
ALT: 5 U/L (ref 0–53)
Alkaline Phosphatase: 39 U/L (ref 39–117)
CO2: 25 mEq/L (ref 19–32)
GFR calc Af Amer: 62 mL/min — ABNORMAL LOW (ref >90)
GFR calc non Af Amer: 53 mL/min — ABNORMAL LOW (ref >90)
Glucose, Bld: 113 mg/dL — ABNORMAL HIGH (ref 70–99)
Potassium: 4 mEq/L (ref 3.5–5.1)
Sodium: 139 mEq/L (ref 135–145)

## 2012-12-04 LAB — PROTIME-INR
INR: 4.06 — ABNORMAL HIGH (ref 0.00–1.49)
Prothrombin Time: 37 seconds — ABNORMAL HIGH (ref 11.6–15.2)

## 2012-12-04 LAB — CBC
MCHC: 32.7 g/dL (ref 30.0–36.0)
Platelets: 301 10*3/uL (ref 150–400)
RDW: 13.2 % (ref 11.5–15.5)

## 2012-12-04 MED ORDER — SENNOSIDES-DOCUSATE SODIUM 8.6-50 MG PO TABS
2.0000 | ORAL_TABLET | Freq: Two times a day (BID) | ORAL | Status: DC
  Administered 2012-12-04 – 2012-12-08 (×9): 2 via ORAL
  Filled 2012-12-04 (×9): qty 2

## 2012-12-04 MED ORDER — POLYETHYLENE GLYCOL 3350 17 G PO PACK
17.0000 g | PACK | Freq: Once | ORAL | Status: AC
  Administered 2012-12-04: 17 g via ORAL
  Filled 2012-12-04: qty 1

## 2012-12-04 MED ORDER — POLYETHYLENE GLYCOL 3350 17 G PO PACK
17.0000 g | PACK | Freq: Every day | ORAL | Status: DC | PRN
  Filled 2012-12-04: qty 1

## 2012-12-04 MED ORDER — HYDROCODONE-ACETAMINOPHEN 5-325 MG PO TABS
1.0000 | ORAL_TABLET | Freq: Two times a day (BID) | ORAL | Status: DC | PRN
  Administered 2012-12-04 – 2012-12-08 (×7): 1 via ORAL
  Filled 2012-12-04 (×7): qty 1

## 2012-12-04 NOTE — Progress Notes (Signed)
Subjective: Patient states that doesn't remember what happened yesterday to bring him to the emergency room. He states that he has been short of breath recently and has had a nonproductive cough. He states that he has been falling recently. He does have trouble with tremors and moving around, seems to be worse after his fall.  He also states that he has had an ongoing battle with constipation. This is chronic for him. He does have some abdominal pain today, states it feels different than his usual constipation pain.  He cannot tell me much else during the interview. He is more alert than yesterday.  Denies recent fever, chills, sputum production, melena, hematuria, chest pain.  Nursing staff at [facility] [phone]), who gave more history. She stated that the patient was concerning on the morning of admission for increased SOB. SOB had been worsening over the past 2 days, but he could not get up out of bed yesterday morning, prompting alarm. She administered 2 puffs Ventolin, but they called EMS when this did not help . He has not c/o abd pain recently. He does have baseline back pain, requiring 1-2 Vicodin (5-500) per day. The nurse states this requirement has been stable for some time. She reports good UOP, good appetite and good fluid intake recently. Regarding the falls, she states that he has only fallen twice (once on 2/12, once on 2/15), and that the current injuries appreciated are from those falls. He does not have a previous history of falls. She believes that these falls are consistent with worsening of Parkinsonism features, including tremors/shaking, intermittent shuffle walking, and inability to maintain balance. She reports that he saw a [employer] doctor on 1/21, who gave pt a wheelchair for ambulation assistance. The falls were due to the patient trying to transfer himself in the bathroom.   Objective: Vital signs in last 24 hours: Filed Vitals:   12/03/12 1623 12/03/12 2135  12/04/12 0500 12/04/12 0810  BP:  120/60 113/69   Pulse:  100 95   Temp:  98 F (36.7 C) 98.9 F (37.2 C)   TempSrc:  Oral Oral   Resp:  20 20   Height: 5\' 8"  (1.727 m)     Weight: 231 lb 4.2 oz (104.9 kg)     SpO2:  97% 96% 94%   Weight change:   Intake/Output Summary (Last 24 hours) at 12/04/12 1050 Last data filed at 12/04/12 0900  Gross per 24 hour  Intake 2967.5 ml  Output    500 ml  Net 2467.5 ml    Physical Exam Blood pressure 113/69, pulse 95, temperature 98.9 F (37.2 C), temperature source Oral, resp. rate 20, height 5\' 8"  (1.727 m), weight 231 lb 4.2 oz (104.9 kg), SpO2 94.00%. General: No acute distress, resting in bed, oriented to person and place, more alert and interactive than yesterday HEENT: Pupils 1-2 mm, minimally reactive to light, EOMI, right cheek hematoma, dry tongue with sticky MM, ecchymoses around the right eye  Cardiovascular: Regular rate and rhythm, no murmurs, rubs or gallops  Respiratory: Clear to auscultation bilaterally, no wheezes, rales, or rhonchi  Abdomen: Soft, slightly distended, reported epigastric tenderness, bowel sounds present, no rebound or guarding  Extremities: Warm and well-perfused, 2+ edema, left shin abrasion  Skin: Warm, dry, no rashes, good pulses  Neuro: More alert than admission, answers most questions appropriately with delayed response. He does sound slightly dysarthric. Resting tremor noted in upper extremities. Patient also has muscle spasms/twitching of his upper extremities when he  tries to lift his hand to scratch his nose. Moving all 4 extremities spontaneously. 4/5 grip strength but will not raise arms on command. Unable to lift legs off the bed. Sensation to light touch intact throughout.   Lab Results: Basic Metabolic Panel:  Recent Labs Lab 12/03/12 0908 12/04/12 0647  NA 137 139  K 4.2 4.0  CL 101 108  CO2 23 25  GLUCOSE 127* 113*  BUN 23 19  CREATININE 2.57* 1.39*  CALCIUM 9.7 8.4   Liver Function  Tests:  Recent Labs Lab 12/03/12 0908 12/04/12 0647  AST 69* 33  ALT 14 5  ALKPHOS 43 39  BILITOT 0.6 0.5  PROT 6.9 5.4*  ALBUMIN 3.3* 2.5*    Recent Labs Lab 12/03/12 0908  LIPASE 39   CBC:  Recent Labs Lab 12/03/12 0908 12/04/12 0647  WBC 15.5* 10.7*  HGB 12.3* 9.7*  HCT 37.8* 29.7*  MCV 90.4 91.1  PLT 347 301   BNP:  Recent Labs Lab 12/03/12 0908  PROBNP 201.0*   Coagulation:  Recent Labs Lab 12/03/12 1120 12/04/12 0647  LABPROT 44.7* 37.0*  INR 5.15* 4.06*   Anemia Panel: No results found for this basename: VITAMINB12, FOLATE, FERRITIN, TIBC, IRON, RETICCTPCT,  in the last 168 hours Urine Drug Screen: Drugs of Abuse     Component Value Date/Time   LABOPIA NONE DETECTED 07/15/2012 0503   COCAINSCRNUR NONE DETECTED 07/15/2012 0503   LABBENZ NONE DETECTED 07/15/2012 0503   AMPHETMU NONE DETECTED 07/15/2012 0503   THCU NONE DETECTED 07/15/2012 0503   LABBARB NONE DETECTED 07/15/2012 0503    Urinalysis:  Recent Labs Lab 12/03/12 1827  COLORURINE AMBER*  LABSPEC 1.024  PHURINE 5.5  GLUCOSEU NEGATIVE  HGBUR TRACE*  BILIRUBINUR SMALL*  KETONESUR 15*  PROTEINUR 30*  UROBILINOGEN 1.0  NITRITE NEGATIVE  LEUKOCYTESUR TRACE*     Micro Results: Recent Results (from the past 240 hour(s))  MRSA PCR SCREENING     Status: None   Collection Time    12/03/12  5:10 PM      Result Value Range Status   MRSA by PCR NEGATIVE  NEGATIVE Final   Comment:            The GeneXpert MRSA Assay (FDA     approved for NASAL specimens     only), is one component of a     comprehensive MRSA colonization     surveillance program. It is not     intended to diagnose MRSA     infection nor to guide or     monitor treatment for     MRSA infections.   Studies/Results: Dg Chest 2 View  12/03/2012  *RADIOLOGY REPORT*  Clinical Data: Fall.  Shortness of breath.  CHEST - 2 VIEW  Comparison: 10/31/2011  Findings: Low lung volumes with bibasilar atelectasis.  Heart is  mildly enlarged.  Mild vascular congestion.  Possible small bilateral effusions.  No acute bony abnormality.  Degenerative changes in the shoulders, right greater than left.  IMPRESSION: Low lung volumes with cardiomegaly, vascular congestion and bibasilar atelectasis.  Suspect small effusions.   Original Report Authenticated By: Charlett Nose, M.D.    Dg Pelvis 1-2 Views  12/03/2012  *RADIOLOGY REPORT*  Clinical Data: Fall, right hip pain.  PELVIS - 1-2 VIEW  Comparison: None  Findings: No acute bony abnormality.  Specifically, no fracture, subluxation, or dislocation.  Soft tissues are intact.  SI joints and hip joints are symmetric and unremarkable.  IMPRESSION: No acute  bony abnormality.   Original Report Authenticated By: Charlett Nose, M.D.    Dg Femur Right  12/03/2012  *RADIOLOGY REPORT*  Clinical Data: Fall, right proximal femoral pain.  RIGHT FEMUR - 2 VIEW  Comparison: None.  Findings: Soft tissue swelling over the knee anteriorly. Irregularity noted that along the anterior tibial tuberosity appears well corticated and likely related to old injury.  No visible acute bony abnormality.  No joint effusion within the right knee.  IMPRESSION: Marked soft tissue swelling over the right knee anteriorly.  No acute bony abnormality.   Original Report Authenticated By: Charlett Nose, M.D.    Ct Head Wo Contrast  12/03/2012  *RADIOLOGY REPORT*  Clinical Data:  Recent fall, history of Parkinson's disease, increased spasticity with weakness  CT HEAD WITHOUT CONTRAST CT MAXILLOFACIAL WITHOUT CONTRAST CT CERVICAL SPINE WITHOUT CONTRAST  Technique:  Multidetector CT imaging of the head, cervical spine, and maxillofacial structures were performed using the standard protocol without intravenous contrast. Multiplanar CT image reconstructions of the cervical spine and maxillofacial structures were also generated.  Comparison:  CT brain scan of 08/04/2012  CT HEAD  Findings: The ventricular system remains prominent, and  the cortical sulci are prominent consistent with diffuse entry.  The septum is in a normal midline position.  Moderate small vessel ischemic change is stable throughout the periventricular white matter.  No hemorrhage, mass lesion, or acute infarction is seen. There does appear to be a right facial hematoma overlying the zygomatic arch.  IMPRESSION: Atrophy and small vessel disease.  Right facial hematoma.  CT MAXILLOFACIAL  Findings:  A superficial hematoma overlies the right zygomatic arch.  However no fracture of the zygomatic arch is seen.  The orbital rims also appear intact.  The mandible is intact and mandibular condyles are in normal position.  The nasal bone appears intact. The paranasal sinuses appear intact.  IMPRESSION: No maxillofacial fracture.  Hematoma overlies the right zygomatic arch.  CT CERVICAL SPINE  Findings:   The cervical vertebrae are in normal alignment.  Only mild degenerative disc disease is noted at C5-6 with some loss of disc space and spurring with sclerosis.  The odontoid process is intact.  No cervical spine fracture is seen.  No prevertebral soft tissue swelling is noted.  The thyroid gland is unremarkable.  No adenopathy is seen throughout the neck.  IMPRESSION: Normal alignment with degenerative disc disease at C5-6.  No acute cervical spine fracture.   Original Report Authenticated By: Dwyane Dee, M.D.    Ct Cervical Spine Wo Contrast  12/03/2012  *RADIOLOGY REPORT*  Clinical Data:  Recent fall, history of Parkinson's disease, increased spasticity with weakness  CT HEAD WITHOUT CONTRAST CT MAXILLOFACIAL WITHOUT CONTRAST CT CERVICAL SPINE WITHOUT CONTRAST  Technique:  Multidetector CT imaging of the head, cervical spine, and maxillofacial structures were performed using the standard protocol without intravenous contrast. Multiplanar CT image reconstructions of the cervical spine and maxillofacial structures were also generated.  Comparison:  CT brain scan of 08/04/2012  CT  HEAD  Findings: The ventricular system remains prominent, and the cortical sulci are prominent consistent with diffuse entry.  The septum is in a normal midline position.  Moderate small vessel ischemic change is stable throughout the periventricular white matter.  No hemorrhage, mass lesion, or acute infarction is seen. There does appear to be a right facial hematoma overlying the zygomatic arch.  IMPRESSION: Atrophy and small vessel disease.  Right facial hematoma.  CT MAXILLOFACIAL  Findings:  A superficial hematoma overlies  the right zygomatic arch.  However no fracture of the zygomatic arch is seen.  The orbital rims also appear intact.  The mandible is intact and mandibular condyles are in normal position.  The nasal bone appears intact. The paranasal sinuses appear intact.  IMPRESSION: No maxillofacial fracture.  Hematoma overlies the right zygomatic arch.  CT CERVICAL SPINE  Findings:   The cervical vertebrae are in normal alignment.  Only mild degenerative disc disease is noted at C5-6 with some loss of disc space and spurring with sclerosis.  The odontoid process is intact.  No cervical spine fracture is seen.  No prevertebral soft tissue swelling is noted.  The thyroid gland is unremarkable.  No adenopathy is seen throughout the neck.  IMPRESSION: Normal alignment with degenerative disc disease at C5-6.  No acute cervical spine fracture.   Original Report Authenticated By: Dwyane Dee, M.D.    Dg Knee Complete 4 Views Left  12/03/2012  *RADIOLOGY REPORT*  Clinical Data: Fall, knee pain.  LEFT KNEE - COMPLETE 4+ VIEW  Comparison: None  Findings: No acute bony abnormality.  Specifically, no fracture, subluxation, or dislocation.  Soft tissues are intact.  No joint effusion.  IMPRESSION: No acute bony abnormality.   Original Report Authenticated By: Charlett Nose, M.D.    Dg Knee Complete 4 Views Right  12/03/2012  *RADIOLOGY REPORT*  Clinical Data: Fall, knee pain.  RIGHT KNEE - COMPLETE 4+ VIEW   Comparison: None.  Findings: Anterior soft tissue swelling over the patella and the joint.  Well corticated bony densities noted along the anterior tibial tuberosity, likely related to old injury.  No acute fracture, subluxation or dislocation.  No joint effusion.  IMPRESSION: Anterior soft tissue swelling.  No acute bony abnormality.   Original Report Authenticated By: Charlett Nose, M.D.    Ct Maxillofacial Wo Cm  12/03/2012  *RADIOLOGY REPORT*  Clinical Data:  Recent fall, history of Parkinson's disease, increased spasticity with weakness  CT HEAD WITHOUT CONTRAST CT MAXILLOFACIAL WITHOUT CONTRAST CT CERVICAL SPINE WITHOUT CONTRAST  Technique:  Multidetector CT imaging of the head, cervical spine, and maxillofacial structures were performed using the standard protocol without intravenous contrast. Multiplanar CT image reconstructions of the cervical spine and maxillofacial structures were also generated.  Comparison:  CT brain scan of 08/04/2012  CT HEAD  Findings: The ventricular system remains prominent, and the cortical sulci are prominent consistent with diffuse entry.  The septum is in a normal midline position.  Moderate small vessel ischemic change is stable throughout the periventricular white matter.  No hemorrhage, mass lesion, or acute infarction is seen. There does appear to be a right facial hematoma overlying the zygomatic arch.  IMPRESSION: Atrophy and small vessel disease.  Right facial hematoma.  CT MAXILLOFACIAL  Findings:  A superficial hematoma overlies the right zygomatic arch.  However no fracture of the zygomatic arch is seen.  The orbital rims also appear intact.  The mandible is intact and mandibular condyles are in normal position.  The nasal bone appears intact. The paranasal sinuses appear intact.  IMPRESSION: No maxillofacial fracture.  Hematoma overlies the right zygomatic arch.  CT CERVICAL SPINE  Findings:   The cervical vertebrae are in normal alignment.  Only mild degenerative  disc disease is noted at C5-6 with some loss of disc space and spurring with sclerosis.  The odontoid process is intact.  No cervical spine fracture is seen.  No prevertebral soft tissue swelling is noted.  The thyroid gland is unremarkable.  No  adenopathy is seen throughout the neck.  IMPRESSION: Normal alignment with degenerative disc disease at C5-6.  No acute cervical spine fracture.   Original Report Authenticated By: Dwyane Dee, M.D.    Medications:  Medications reviewed  Scheduled Meds: . antiseptic oral rinse  15 mL Mouth Rinse q12n4p  . benztropine  1 mg Oral BID  . carbidopa-levodopa  0.5 tablet Oral TID  . chlorhexidine  15 mL Mouth Rinse BID  . divalproex  250 mg Oral Daily  . fenofibrate  160 mg Oral Daily  . heparin  5,000 Units Subcutaneous Q8H  . levothyroxine  75 mcg Oral QAC breakfast  . lithium carbonate  900 mg Oral QHS  . mometasone-formoterol  2 puff Inhalation BID  . OLANZapine  20 mg Oral QHS  . OXcarbazepine  300 mg Oral BID  . senna-docusate  2 tablet Oral BID  . simvastatin  10 mg Oral q1800  . tiotropium  18 mcg Inhalation Daily  . traZODone  100 mg Oral QHS  . vitamin B-12  1,000 mcg Oral Daily   Continuous Infusions: . sodium chloride 150 mL/hr at 12/04/12 0529   PRN Meds:.acetaminophen, albuterol, alum & mag hydroxide-simeth, guaiFENesin, HYDROmorphone, polyethylene glycol  Assessment/Plan:  Mr. 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   AKI: resolving  Reported baseline is 1.23. BUN 23 and Cr 2.57 on admission. Labs and exam consistent with dehydration. Decreased cr clearance may be contributing to patient's altered mental status by increasing the serum  concentration of psych medications. 2/20: Cr 1.39 today, BUN 19. Foley placed overnight due to urinary retention  - cont IVF  - Strict I/Os  - Hold losartan, DC lasix -daily BMP  Acute worsening of mental status Patient has Parkinson-type neurodegenerative disorder and has baseline mental deficits, however, no family is present to assist with defining patient's baseline. He is currently living in a facility. On admission, he had increased somnolence and disorientation. Delayed responses likely due Parkinson's. No obvious infectious cause of altered mental status, though patient not able to give accurate history and has an elevated white count.. No evidence of PNA on CXR but it was a poor quality. Urine sediment not consistent with UTI. No neck stiffness to suggest meningitis. Acute renal failure may be a cause of worsening AMS as well.  Considered acute stroke, however, ischemic stroke less likely as patient's INR is 5 on admission and head CT negative for acute hemorrhagic stroke. Lithium level wnl Patient is on many home medications including narcotics and psychiatric medications that may be contributing to altered mental status combined with increased serum concentrations from AKI. Polypharmacy is a major concern for this patient.  -treat AKI as above -review medication list and dc meds if possible. 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  2/20: INR 4 today -hold Coumadin, monitor INR  -coumadin dosing per pharmacy   Recent falls  No fractures noted on plain films, CT spine, face, or CT head. Patient does have hematoma and ecchymosis on face and abrasion on lower extremity. Falls may be from worsening parkinsonism  vs medication effects  -cont benztropine, levodopa  -PT/OT consult  COPD  No evidence of acute exacerbation, no  wheezing on exam. He does have NP cough. Will monitor. -Continue home dulera, Spiriva, PRN albuterol  Neuroleptic-induced Parkinson's  Likely contributing to his falls and delayed responses. -Continue Sinemet and Cogentin  -consider neurology consult for optimal management  Bipolar disorder  Continue home meds for now   DVT  -pt supra therapeutic on coumadin on admission   Dispo  -anticipate dc in 2-4 days  -will need assistance with placement     LOS: 1 day   [place] Ar 12/04/2012, 10:50 AM

## 2012-12-04 NOTE — Progress Notes (Signed)
Pt.has not voided since 6pm yesterday after in & out  Cath.done to him.Notified DR.Ziemer & ordered to do bladder scan &  Bladder scan done & obtained 387 cc.& left the foley in as ordered.

## 2012-12-04 NOTE — Progress Notes (Signed)
ANTICOAGULATION CONSULT NOTE - Follow Up Consult  Pharmacy Consult for Coumadin Indication: H/O PE  Allergies  Allergen Reactions  . Benzodiazepines Other (See Comments)    Causes aggression, paradoxical reaction  . Other     Anti-anxiety medications    Patient Measurements: Height: 5\' 8"  (172.7 cm) Weight: 231 lb 4.2 oz (104.9 kg) IBW/kg (Calculated) : 68.4  Vital Signs: Temp: 98.9 F (37.2 C) (02/20 0500) Temp src: Oral (02/20 0500) BP: 113/69 mmHg (02/20 0500) Pulse Rate: 95 (02/20 0500)  Labs:  Recent Labs  12/03/12 0908 12/03/12 1120 12/04/12 0647  HGB 12.3*  --  9.7*  HCT 37.8*  --  29.7*  PLT 347  --  301  APTT  --  83*  --   LABPROT  --  44.7* 37.0*  INR  --  5.15* 4.06*  CREATININE 2.57*  --  1.39*    Estimated Creatinine Clearance: 65.5 ml/min (by C-G formula based on Cr of 1.39).   Medical History: Past Medical History  Diagnosis Date  . COPD (chronic obstructive pulmonary disease)   . Pulmonary embolus   . Hypertension   . Bipolar 1 disorder   . Neuroleptic-induced Parkinsonism   . Anxiety   . Hyperlipidemia   . Falls infrequently   . Current use of long term anticoagulation   . Hypothyroidism   . Shortness of breath     Medications:  Extensive [facility] MAR med list, includes: Coumadin 1mg  twice a week (Tue/Thu) and 2.5mg  other days  Assessment: INR remains elevated.  Hg has dropped and no overt bleeding noted-?dilutional?  Goal of Therapy:  INR 2-3 Monitor platelets by anticoagulation protocol: Yes   Plan:  - No Coumadin today - Will check daily PT/INR and CBC at least once a week - Will f/up clinical status daily  Thanks, Meera K. Allena Katz, PharmD Clinical Pharmacist Pager [phone] 12/04/2012 8:09 AM

## 2012-12-04 NOTE — Progress Notes (Signed)
Medical Student Daily Progress Note  Subjective: Pt is markedly more attentive and interactive during today's exam.  He is still unable to give complete history, but he states that he has had increased falls since January.  He believes he last fell about 1 week ago, sustaining the injuries that are currently appreciated.  He states that a fall is not what prompted his trip to the ED yesterday, but that he believes the nursing home staff wanted him to be evaluated for an unknown reason.  He complains mainly of exquisite 'spinal pain,' including his back and posterior neck.  He also endorses increased SOB and breathing difficulty, which he states is his baseline with COPD.  He normally does not use oxygen at the NH, but feels as though he probably needs it.  He denies CP, mood disturbance, SI, and thoughts of harming others.  I talked with a nurse at [facility] [phone]), who gave a little more history.  She stated that the patient was concerning on the morning of admission for increased SOB.  SOB had been worsening over the past 2 days, but he could not get up out of bed yesterday morning, prompting alarm.  She administered 2 puffs Ventolin, but they called EMS when this did not help .  He has not c/o abd pain recently.  He does have baseline back pain, requiring 1-2 Vicodin (5-500) per day.  The nurse states this requirement has been stable for some time.  She reports good UOP, good appetite and good fluid intake recently.  Regarding the falls, she states that he has only fallen twice (once on 2/12, once on 2/15), and that the current injuries appreciated are from those falls.  He does not have a previous history of falls.  She believes that these falls are consistent with worsening of Parkinsonism features, including tremors/shaking, intermittent shuffle walking, and inability to maintain balance.  She reports that he saw a [employer] doctor on 1/21, who attempted to force him to use a wheelchair for  ambulation assistance.  The falls were due to the patient trying to transfer himself in the bathroom.  Objective: Vital signs in last 24 hours: Filed Vitals:   12/03/12 1623 12/03/12 2135 12/04/12 0500 12/04/12 0810  BP:  120/60 113/69   Pulse:  100 95   Temp:  98 F (36.7 C) 98.9 F (37.2 C)   TempSrc:  Oral Oral   Resp:  20 20   Height: 5\' 8"  (1.727 m)     Weight: 104.9 kg (231 lb 4.2 oz)     SpO2:  97% 96% 94%   Weight change:   Intake/Output Summary (Last 24 hours) at 12/04/12 0948 Last data filed at 12/04/12 0900  Gross per 24 hour  Intake 2967.5 ml  Output    500 ml  Net 2467.5 ml   Physical Exam: BP 113/69  Pulse 95  Temp(Src) 98.9 F (37.2 C) (Oral)  Resp 20  Ht 5\' 8"  (1.727 m)  Wt 104.9 kg (231 lb 4.2 oz)  BMI 35.17 kg/m2  SpO2 94% General appearance: increased attention from yesterday, required less prompting for exam and interview, does have some difficulty expressing himself HEENT: R cheek hematoma stable, R eye ecchymosis stable Eyes: EOMI, PERRL, central vision intact, correctly identified upper L visual field, but incorrectly identified number of fingers for other three gross visual fields Lungs: CTA in anterior lung fields, no wheezing or crackles appreciated, NWOB overall, but there were times during the exam when he  would jerk and gasp for breath before returning to baseline, no use of accessory muscles Heart: regular rate and rhythm, S1, S2 normal, no murmur, click, rub or gallop Abdomen: Tense, Obese, Diffuse TTP throughout all quadrants, +BS, no organomegaly appreciated Extremities: 2+ BLE pitting edema Neurologic: Continued 2/5 strength in RLE, Improvement to 3/5 strength in LLE, 4/5 hand strength  Lab Results: Results for orders placed during the hospital encounter of 12/03/12 (from the past 24 hour(s))  PROTIME-INR     Status: Abnormal   Collection Time    12/03/12 11:20 AM      Result Value Range   Prothrombin Time 44.7 (*) 11.6 - 15.2  seconds   INR 5.15 (*) 0.00 - 1.49  APTT     Status: Abnormal   Collection Time    12/03/12 11:20 AM      Result Value Range   aPTT 83 (*) 24 - 37 seconds  MRSA PCR SCREENING     Status: None   Collection Time    12/03/12  5:10 PM      Result Value Range   MRSA by PCR NEGATIVE  NEGATIVE  URINALYSIS, ROUTINE W REFLEX MICROSCOPIC     Status: Abnormal   Collection Time    12/03/12  6:27 PM      Result Value Range   Color, Urine AMBER (*) YELLOW   APPearance CLOUDY (*) CLEAR   Specific Gravity, Urine 1.024  1.005 - 1.030   pH 5.5  5.0 - 8.0   Glucose, UA NEGATIVE  NEGATIVE mg/dL   Hgb urine dipstick TRACE (*) NEGATIVE   Bilirubin Urine SMALL (*) NEGATIVE   Ketones, ur 15 (*) NEGATIVE mg/dL   Protein, ur 30 (*) NEGATIVE mg/dL   Urobilinogen, UA 1.0  0.0 - 1.0 mg/dL   Nitrite NEGATIVE  NEGATIVE   Leukocytes, UA TRACE (*) NEGATIVE  CREATININE, URINE, RANDOM     Status: None   Collection Time    12/03/12  6:27 PM      Result Value Range   Creatinine, Urine 251.21    SODIUM, URINE, RANDOM     Status: None   Collection Time    12/03/12  6:27 PM      Result Value Range   Sodium, Ur 29    URINE MICROSCOPIC-ADD ON     Status: Abnormal   Collection Time    12/03/12  6:27 PM      Result Value Range   Squamous Epithelial / LPF FEW (*) RARE   WBC, UA 3-6  <3 WBC/hpf   RBC / HPF 0-2  <3 RBC/hpf   Bacteria, UA RARE  RARE   Casts HYALINE CASTS (*) NEGATIVE   Crystals CA OXALATE CRYSTALS (*) NEGATIVE  LITHIUM LEVEL     Status: None   Collection Time    12/03/12  7:37 PM      Result Value Range   Lithium Lvl 1.26  0.80 - 1.40 mEq/L  COMPREHENSIVE METABOLIC PANEL     Status: Abnormal   Collection Time    12/04/12  6:47 AM      Result Value Range   Sodium 139  135 - 145 mEq/L   Potassium 4.0  3.5 - 5.1 mEq/L   Chloride 108  96 - 112 mEq/L   CO2 25  19 - 32 mEq/L   Glucose, Bld 113 (*) 70 - 99 mg/dL   BUN 19  6 - 23 mg/dL   Creatinine, Ser 1.91 (*) 0.50 - 1.35  mg/dL   Calcium  8.4  8.4 - 11.9 mg/dL   Total Protein 5.4 (*) 6.0 - 8.3 g/dL   Albumin 2.5 (*) 3.5 - 5.2 g/dL   AST 33  0 - 37 U/L   ALT 5  0 - 53 U/L   Alkaline Phosphatase 39  39 - 117 U/L   Total Bilirubin 0.5  0.3 - 1.2 mg/dL   GFR calc non Af Amer 53 (*) >90 mL/min   GFR calc Af Amer 62 (*) >90 mL/min  CBC     Status: Abnormal   Collection Time    12/04/12  6:47 AM      Result Value Range   WBC 10.7 (*) 4.0 - 10.5 K/uL   RBC 3.26 (*) 4.22 - 5.81 MIL/uL   Hemoglobin 9.7 (*) 13.0 - 17.0 g/dL   HCT 14.7 (*) 82.9 - 56.2 %   MCV 91.1  78.0 - 100.0 fL   MCH 29.8  26.0 - 34.0 pg   MCHC 32.7  30.0 - 36.0 g/dL   RDW 13.0  86.5 - 78.4 %   Platelets 301  150 - 400 K/uL  PROTIME-INR     Status: Abnormal   Collection Time    12/04/12  6:47 AM      Result Value Range   Prothrombin Time 37.0 (*) 11.6 - 15.2 seconds   INR 4.06 (*) 0.00 - 1.49   Micro Results: Recent Results (from the past 240 hour(s))  MRSA PCR SCREENING     Status: None   Collection Time    12/03/12  5:10 PM      Result Value Range Status   MRSA by PCR NEGATIVE  NEGATIVE Final   Comment:            The GeneXpert MRSA Assay (FDA     approved for NASAL specimens     only), is one component of a     comprehensive MRSA colonization     surveillance program. It is not     intended to diagnose MRSA     infection nor to guide or     monitor treatment for     MRSA infections.   Studies/Results: Dg Chest 2 View  12/03/2012  *RADIOLOGY REPORT*  Clinical Data: Fall.  Shortness of breath.  CHEST - 2 VIEW  Comparison: 10/31/2011  Findings: Low lung volumes with bibasilar atelectasis.  Heart is mildly enlarged.  Mild vascular congestion.  Possible small bilateral effusions.  No acute bony abnormality.  Degenerative changes in the shoulders, right greater than left.  IMPRESSION: Low lung volumes with cardiomegaly, vascular congestion and bibasilar atelectasis.  Suspect small effusions.   Original Report Authenticated By: Charlett Nose, M.D.     Dg Pelvis 1-2 Views  12/03/2012  *RADIOLOGY REPORT*  Clinical Data: Fall, right hip pain.  PELVIS - 1-2 VIEW  Comparison: None  Findings: No acute bony abnormality.  Specifically, no fracture, subluxation, or dislocation.  Soft tissues are intact.  SI joints and hip joints are symmetric and unremarkable.  IMPRESSION: No acute bony abnormality.   Original Report Authenticated By: Charlett Nose, M.D.    Dg Femur Right  12/03/2012  *RADIOLOGY REPORT*  Clinical Data: Fall, right proximal femoral pain.  RIGHT FEMUR - 2 VIEW  Comparison: None.  Findings: Soft tissue swelling over the knee anteriorly. Irregularity noted that along the anterior tibial tuberosity appears well corticated and likely related to old injury.  No visible acute bony abnormality.  No joint effusion  within the right knee.  IMPRESSION: Marked soft tissue swelling over the right knee anteriorly.  No acute bony abnormality.   Original Report Authenticated By: Charlett Nose, M.D.    Ct Head Wo Contrast  12/03/2012  *RADIOLOGY REPORT*  Clinical Data:  Recent fall, history of Parkinson's disease, increased spasticity with weakness  CT HEAD WITHOUT CONTRAST CT MAXILLOFACIAL WITHOUT CONTRAST CT CERVICAL SPINE WITHOUT CONTRAST  Technique:  Multidetector CT imaging of the head, cervical spine, and maxillofacial structures were performed using the standard protocol without intravenous contrast. Multiplanar CT image reconstructions of the cervical spine and maxillofacial structures were also generated.  Comparison:  CT brain scan of 08/04/2012  CT HEAD  Findings: The ventricular system remains prominent, and the cortical sulci are prominent consistent with diffuse entry.  The septum is in a normal midline position.  Moderate small vessel ischemic change is stable throughout the periventricular white matter.  No hemorrhage, mass lesion, or acute infarction is seen. There does appear to be a right facial hematoma overlying the zygomatic arch.  IMPRESSION:  Atrophy and small vessel disease.  Right facial hematoma.  CT MAXILLOFACIAL  Findings:  A superficial hematoma overlies the right zygomatic arch.  However no fracture of the zygomatic arch is seen.  The orbital rims also appear intact.  The mandible is intact and mandibular condyles are in normal position.  The nasal bone appears intact. The paranasal sinuses appear intact.  IMPRESSION: No maxillofacial fracture.  Hematoma overlies the right zygomatic arch.  CT CERVICAL SPINE  Findings:   The cervical vertebrae are in normal alignment.  Only mild degenerative disc disease is noted at C5-6 with some loss of disc space and spurring with sclerosis.  The odontoid process is intact.  No cervical spine fracture is seen.  No prevertebral soft tissue swelling is noted.  The thyroid gland is unremarkable.  No adenopathy is seen throughout the neck.  IMPRESSION: Normal alignment with degenerative disc disease at C5-6.  No acute cervical spine fracture.   Original Report Authenticated By: Dwyane Dee, M.D.    Ct Cervical Spine Wo Contrast  12/03/2012  *RADIOLOGY REPORT*  Clinical Data:  Recent fall, history of Parkinson's disease, increased spasticity with weakness  CT HEAD WITHOUT CONTRAST CT MAXILLOFACIAL WITHOUT CONTRAST CT CERVICAL SPINE WITHOUT CONTRAST  Technique:  Multidetector CT imaging of the head, cervical spine, and maxillofacial structures were performed using the standard protocol without intravenous contrast. Multiplanar CT image reconstructions of the cervical spine and maxillofacial structures were also generated.  Comparison:  CT brain scan of 08/04/2012  CT HEAD  Findings: The ventricular system remains prominent, and the cortical sulci are prominent consistent with diffuse entry.  The septum is in a normal midline position.  Moderate small vessel ischemic change is stable throughout the periventricular white matter.  No hemorrhage, mass lesion, or acute infarction is seen. There does appear to be a right  facial hematoma overlying the zygomatic arch.  IMPRESSION: Atrophy and small vessel disease.  Right facial hematoma.  CT MAXILLOFACIAL  Findings:  A superficial hematoma overlies the right zygomatic arch.  However no fracture of the zygomatic arch is seen.  The orbital rims also appear intact.  The mandible is intact and mandibular condyles are in normal position.  The nasal bone appears intact. The paranasal sinuses appear intact.  IMPRESSION: No maxillofacial fracture.  Hematoma overlies the right zygomatic arch.  CT CERVICAL SPINE  Findings:   The cervical vertebrae are in normal alignment.  Only mild degenerative  disc disease is noted at C5-6 with some loss of disc space and spurring with sclerosis.  The odontoid process is intact.  No cervical spine fracture is seen.  No prevertebral soft tissue swelling is noted.  The thyroid gland is unremarkable.  No adenopathy is seen throughout the neck.  IMPRESSION: Normal alignment with degenerative disc disease at C5-6.  No acute cervical spine fracture.   Original Report Authenticated By: Dwyane Dee, M.D.    Dg Knee Complete 4 Views Left  12/03/2012  *RADIOLOGY REPORT*  Clinical Data: Fall, knee pain.  LEFT KNEE - COMPLETE 4+ VIEW  Comparison: None  Findings: No acute bony abnormality.  Specifically, no fracture, subluxation, or dislocation.  Soft tissues are intact.  No joint effusion.  IMPRESSION: No acute bony abnormality.   Original Report Authenticated By: Charlett Nose, M.D.    Dg Knee Complete 4 Views Right  12/03/2012  *RADIOLOGY REPORT*  Clinical Data: Fall, knee pain.  RIGHT KNEE - COMPLETE 4+ VIEW  Comparison: None.  Findings: Anterior soft tissue swelling over the patella and the joint.  Well corticated bony densities noted along the anterior tibial tuberosity, likely related to old injury.  No acute fracture, subluxation or dislocation.  No joint effusion.  IMPRESSION: Anterior soft tissue swelling.  No acute bony abnormality.   Original Report  Authenticated By: Charlett Nose, M.D.    Ct Maxillofacial Wo Cm  12/03/2012  *RADIOLOGY REPORT*  Clinical Data:  Recent fall, history of Parkinson's disease, increased spasticity with weakness  CT HEAD WITHOUT CONTRAST CT MAXILLOFACIAL WITHOUT CONTRAST CT CERVICAL SPINE WITHOUT CONTRAST  Technique:  Multidetector CT imaging of the head, cervical spine, and maxillofacial structures were performed using the standard protocol without intravenous contrast. Multiplanar CT image reconstructions of the cervical spine and maxillofacial structures were also generated.  Comparison:  CT brain scan of 08/04/2012  CT HEAD  Findings: The ventricular system remains prominent, and the cortical sulci are prominent consistent with diffuse entry.  The septum is in a normal midline position.  Moderate small vessel ischemic change is stable throughout the periventricular white matter.  No hemorrhage, mass lesion, or acute infarction is seen. There does appear to be a right facial hematoma overlying the zygomatic arch.  IMPRESSION: Atrophy and small vessel disease.  Right facial hematoma.  CT MAXILLOFACIAL  Findings:  A superficial hematoma overlies the right zygomatic arch.  However no fracture of the zygomatic arch is seen.  The orbital rims also appear intact.  The mandible is intact and mandibular condyles are in normal position.  The nasal bone appears intact. The paranasal sinuses appear intact.  IMPRESSION: No maxillofacial fracture.  Hematoma overlies the right zygomatic arch.  CT CERVICAL SPINE  Findings:   The cervical vertebrae are in normal alignment.  Only mild degenerative disc disease is noted at C5-6 with some loss of disc space and spurring with sclerosis.  The odontoid process is intact.  No cervical spine fracture is seen.  No prevertebral soft tissue swelling is noted.  The thyroid gland is unremarkable.  No adenopathy is seen throughout the neck.  IMPRESSION: Normal alignment with degenerative disc disease at C5-6.   No acute cervical spine fracture.   Original Report Authenticated By: Dwyane Dee, M.D.    Medications:  I have reviewed the patient's current medications. Anti-infectives   None     Scheduled Meds: . antiseptic oral rinse  15 mL Mouth Rinse q12n4p  . benztropine  1 mg Oral BID  . carbidopa-levodopa  0.5 tablet Oral TID  . chlorhexidine  15 mL Mouth Rinse BID  . divalproex  250 mg Oral Daily  . fenofibrate  160 mg Oral Daily  . heparin  5,000 Units Subcutaneous Q8H  . levothyroxine  75 mcg Oral QAC breakfast  . lithium carbonate  900 mg Oral QHS  . mometasone-formoterol  2 puff Inhalation BID  . OLANZapine  20 mg Oral QHS  . OXcarbazepine  300 mg Oral BID  . senna-docusate  2 tablet Oral BID  . simvastatin  10 mg Oral q1800  . tiotropium  18 mcg Inhalation Daily  . traZODone  100 mg Oral QHS  . vitamin B-12  1,000 mcg Oral Daily   Continuous Infusions: . sodium chloride 150 mL/hr at 12/04/12 0529   PRN Meds:.acetaminophen, albuterol, alum & mag hydroxide-simeth, guaiFENesin, HYDROmorphone, polyethylene glycol  Assessment/Plan: 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   1. AKI: BUN 19 and Cr 1.39 today with rehydration.  FeNa is 0.2% and urine Na 29, supporting prerenal.  He has had better UOP (800cc today) with foley catheter in place.   - Continue to hold losartan - Strict I/Os - Continue IVF rehydration    2. Acute worsening of mental status: C/o polypharmacy, AKI leading to decreased clearance of medications and dehydrations as instigating components.   We still cannot r/o infection with admission WBC of 15.5.  The WBC count has decreased today, but this decrease has  been seen in all cell lines, which is consistent with IVF therapy.  U/A is not concerning for UTI.  However, CXR 2V would be useful to evaluate for pneumonia, as admission CXR showed inadequate inflation.  For polypharmacy, we will ask neuro to make recommendations on parkinsonism and psych medications.  Lithium level was on upper limit of normal.  With all of this information in hand, we will make pharmacologic decisions in the AM.  Please see discussion of individual comorbidities below re: possible medication d/c. - Obtain CXR 2V in AM to evaluate for pneumonia - Address polypharmacy - Continue IVF rehydration  3. Falls: Still most likely to due worsening parkinsonism v. orthostatic hypotension.  Will obtain neuro c/s, as we would appreciate recommendations on parkinsonism medication optimization.  Combined with PT/OT, these recommendations will help [place] optimize our functional status.  Will continue meds as is for now. - Continue benztropine - Continue carb/levo - PT/OT eval  4. Supratherapeutic INR s/p warfarin therapy for PE: INR today is 4.06. Will continue to hold warfarin, as he remains supratherapeutic.  Will need to decide with his PCP if warfarin therapy will be continued, due to concerns about fall risk.   - Continue to hold warfarin for now  - Continue to follow INR  - Pharmacy c/s, appreciate recommendations  5. HTN: BP running in 110-125/69-81. Will continue to hold BP meds for now.  Since he is not having problems with BP control, will consider not reinstating amlodipine, as it can instigate some peripheral edema.  Some tachycardia may make metoprolol a good choice if BP control becomes an issue.  Continue to hold losartan in setting of AKI, and no real indication for it, as he does not have DM or heart disease. -  Hold amlodipine  - Hold losartan  - Hold metoprolol   4. COPD: Continue home regimen.  - Continue Spiriva  - Continue Advair   6. HC: We do not have an FLP.  We will  continue home regimen for now, but may be possible to d/c his fenofibrate and continue pravastatin for primary prevention. - Continue fenofibrate  - Continue pravastatin   7. Drug-induced parkinsonism: Continue management as described above in "falls" section.   8. Bipolar d/o: Continue home regimen, but we will likely d/c one or more of these meds.  Trileptal may be the first target, as it has the highest side effect profile and is a 3rd line bipolar drug. Lithium would be a second choice. - Continue Depakote  - Continue lithium  - Continue olanzapine  - Continue trileptal   9. Hypothyroidism: Continue home regimen.  - Continue levothyroxine  10. Sleep disturbance: Continue home regimen, but consider d/c trazodone, due to increased somnolence.  - Continue trazadone 100mg  daily   11. FEN/GI:  - Full liquid diet   12. Dispo: Admit to the floor. Will look into new nursing home placement. Will need PT/OT evaluation.     LOS: 1 day   This is a Psychologist, occupational Note.  The care of the patient was discussed with Dr. Collier Bullock and the assessment and plan formulated with their assistance.  Please see their attached note for official documentation of the daily encounter.  Wynona Meals Amy 12/04/2012, 9:48 AM

## 2012-12-04 NOTE — H&P (Signed)
Resident Co-sign Daily Note: I have seen the patient and reviewed the note by Jaye Beagle, MS 3, and discussed the care of the patient with them.  See my note for that day for documentation of my findings, assessment, and plans.  [place] Ar 12/04/2012, 2:11 PM

## 2012-12-04 NOTE — H&P (Signed)
Internal Medicine Attending Admission Note Date: 12/04/2012  Patient name: Derek Hicks Medical record number: [id number] Date of birth: 15-Nov-1950 Age: 62 y.o. Gender: male  I saw and evaluated the patient. I reviewed the resident's note and I agree with the resident's findings and plan as documented in the resident's note.  Chief Complaint(s): Falls for at least one month.  History - key components related to admission:  Derek Hicks is a 62 year old man who presents with frequent falls. He currently is in a nursing home and history is somewhat spotty. Apparently since January he's had increasing falls associated with some lower extremity weakness. He denies any vertigo, orthostatic symptoms, chest pain, palpitations, shortness of breath, weakness, or numbness. He does admit to a worsening in his resting tremor over the last several weeks. He is unable to give a more detailed history.  Physical Exam - key components related to admission:  Filed Vitals:   12/03/12 1623 12/03/12 2135 12/04/12 0500 12/04/12 0810  BP:  120/60 113/69   Pulse:  100 95   Temp:  98 F (36.7 C) 98.9 F (37.2 C)   TempSrc:  Oral Oral   Resp:  20 20   Height: 5\' 8"  (1.727 m)     Weight: 231 lb 4.2 oz (104.9 kg)     SpO2:  97% 96% 94%   General: Well-developed, well-nourished, man lying comfortably in bed in no acute distress. HEENT: Right facial cheek hematoma with bruising around the eye. Lungs: Clear to auscultation bilaterally without wheezes, rhonchi, or rales. Heart: Regular rate and rhythm without murmurs, rubs, or gallops. Abdomen: Soft, nontender, active bowel sounds. Extremities: 1+ pitting edema bilaterally with bilateral knee abrasions and mild right knee swelling. Neuro: Positive resting tremor and associated cogwheeling in the upper extremities.  Lab results:  Basic Metabolic Panel:  Recent Labs  29/52/84 0908 12/04/12 0647  NA 137 139  K 4.2 4.0  CL 101 108  CO2 23 25  GLUCOSE 127*  113*  BUN 23 19  CREATININE 2.57* 1.39*  CALCIUM 9.7 8.4   Liver Function Tests:  Recent Labs  12/03/12 0908 12/04/12 0647  AST 69* 33  ALT 14 5  ALKPHOS 43 39  BILITOT 0.6 0.5  PROT 6.9 5.4*  ALBUMIN 3.3* 2.5*    Recent Labs  12/03/12 0908  LIPASE 39   CBC:  Recent Labs  12/03/12 0908 12/04/12 0647  WBC 15.5* 10.7*  HGB 12.3* 9.7*  HCT 37.8* 29.7*  MCV 90.4 91.1  PLT 347 301   Coagulation:  Recent Labs  12/03/12 1120 12/04/12 0647  INR 5.15* 4.06*   Urinalysis:  Specific gravity 1.0-4, pH 5.5, hemoglobin trace, bilirubin small, ketones 15, protein 30, nitrite negative, trace leukocytes, sodium 29, 3-6 white blood cells per high-power field, 0-2 red blood cells per high-power field, calcium oxalate crystals.  Misc. Labs:  Lithium 1.26 FOBT pending  Imaging results:  Dg Chest 2 View  12/03/2012  *RADIOLOGY REPORT*  Clinical Data: Fall.  Shortness of breath.  CHEST - 2 VIEW  Comparison: 10/31/2011  Findings: Low lung volumes with bibasilar atelectasis.  Heart is mildly enlarged.  Mild vascular congestion.  Possible small bilateral effusions.  No acute bony abnormality.  Degenerative changes in the shoulders, right greater than left.  IMPRESSION: Low lung volumes with cardiomegaly, vascular congestion and bibasilar atelectasis.  Suspect small effusions.   Original Report Authenticated By: Charlett Nose, M.D.    Dg Pelvis 1-2 Views  12/03/2012  *RADIOLOGY REPORT*  Clinical  Data: Fall, right hip pain.  PELVIS - 1-2 VIEW  Comparison: None  Findings: No acute bony abnormality.  Specifically, no fracture, subluxation, or dislocation.  Soft tissues are intact.  SI joints and hip joints are symmetric and unremarkable.  IMPRESSION: No acute bony abnormality.   Original Report Authenticated By: Charlett Nose, M.D.    Dg Femur Right  12/03/2012  *RADIOLOGY REPORT*  Clinical Data: Fall, right proximal femoral pain.  RIGHT FEMUR - 2 VIEW  Comparison: None.  Findings: Soft  tissue swelling over the knee anteriorly. Irregularity noted that along the anterior tibial tuberosity appears well corticated and likely related to old injury.  No visible acute bony abnormality.  No joint effusion within the right knee.  IMPRESSION: Marked soft tissue swelling over the right knee anteriorly.  No acute bony abnormality.   Original Report Authenticated By: Charlett Nose, M.D.    Ct Head Wo Contrast  12/03/2012  *RADIOLOGY REPORT*  Clinical Data:  Recent fall, history of Parkinson's disease, increased spasticity with weakness  CT HEAD WITHOUT CONTRAST CT MAXILLOFACIAL WITHOUT CONTRAST CT CERVICAL SPINE WITHOUT CONTRAST  Technique:  Multidetector CT imaging of the head, cervical spine, and maxillofacial structures were performed using the standard protocol without intravenous contrast. Multiplanar CT image reconstructions of the cervical spine and maxillofacial structures were also generated.  Comparison:  CT brain scan of 08/04/2012  CT HEAD  Findings: The ventricular system remains prominent, and the cortical sulci are prominent consistent with diffuse entry.  The septum is in a normal midline position.  Moderate small vessel ischemic change is stable throughout the periventricular white matter.  No hemorrhage, mass lesion, or acute infarction is seen. There does appear to be a right facial hematoma overlying the zygomatic arch.  IMPRESSION: Atrophy and small vessel disease.  Right facial hematoma.  CT MAXILLOFACIAL  Findings:  A superficial hematoma overlies the right zygomatic arch.  However no fracture of the zygomatic arch is seen.  The orbital rims also appear intact.  The mandible is intact and mandibular condyles are in normal position.  The nasal bone appears intact. The paranasal sinuses appear intact.  IMPRESSION: No maxillofacial fracture.  Hematoma overlies the right zygomatic arch.  CT CERVICAL SPINE  Findings:   The cervical vertebrae are in normal alignment.  Only mild degenerative  disc disease is noted at C5-6 with some loss of disc space and spurring with sclerosis.  The odontoid process is intact.  No cervical spine fracture is seen.  No prevertebral soft tissue swelling is noted.  The thyroid gland is unremarkable.  No adenopathy is seen throughout the neck.  IMPRESSION: Normal alignment with degenerative disc disease at C5-6.  No acute cervical spine fracture.   Original Report Authenticated By: Dwyane Dee, M.D.    Ct Cervical Spine Wo Contrast  12/03/2012  *RADIOLOGY REPORT*  Clinical Data:  Recent fall, history of Parkinson's disease, increased spasticity with weakness  CT HEAD WITHOUT CONTRAST CT MAXILLOFACIAL WITHOUT CONTRAST CT CERVICAL SPINE WITHOUT CONTRAST  Technique:  Multidetector CT imaging of the head, cervical spine, and maxillofacial structures were performed using the standard protocol without intravenous contrast. Multiplanar CT image reconstructions of the cervical spine and maxillofacial structures were also generated.  Comparison:  CT brain scan of 08/04/2012  CT HEAD  Findings: The ventricular system remains prominent, and the cortical sulci are prominent consistent with diffuse entry.  The septum is in a normal midline position.  Moderate small vessel ischemic change is stable throughout the periventricular white  matter.  No hemorrhage, mass lesion, or acute infarction is seen. There does appear to be a right facial hematoma overlying the zygomatic arch.  IMPRESSION: Atrophy and small vessel disease.  Right facial hematoma.  CT MAXILLOFACIAL  Findings:  A superficial hematoma overlies the right zygomatic arch.  However no fracture of the zygomatic arch is seen.  The orbital rims also appear intact.  The mandible is intact and mandibular condyles are in normal position.  The nasal bone appears intact. The paranasal sinuses appear intact.  IMPRESSION: No maxillofacial fracture.  Hematoma overlies the right zygomatic arch.  CT CERVICAL SPINE  Findings:   The cervical  vertebrae are in normal alignment.  Only mild degenerative disc disease is noted at C5-6 with some loss of disc space and spurring with sclerosis.  The odontoid process is intact.  No cervical spine fracture is seen.  No prevertebral soft tissue swelling is noted.  The thyroid gland is unremarkable.  No adenopathy is seen throughout the neck.  IMPRESSION: Normal alignment with degenerative disc disease at C5-6.  No acute cervical spine fracture.   Original Report Authenticated By: Dwyane Dee, M.D.    Dg Knee Complete 4 Views Left  12/03/2012  *RADIOLOGY REPORT*  Clinical Data: Fall, knee pain.  LEFT KNEE - COMPLETE 4+ VIEW  Comparison: None  Findings: No acute bony abnormality.  Specifically, no fracture, subluxation, or dislocation.  Soft tissues are intact.  No joint effusion.  IMPRESSION: No acute bony abnormality.   Original Report Authenticated By: Charlett Nose, M.D.    Dg Knee Complete 4 Views Right  12/03/2012  *RADIOLOGY REPORT*  Clinical Data: Fall, knee pain.  RIGHT KNEE - COMPLETE 4+ VIEW  Comparison: None.  Findings: Anterior soft tissue swelling over the patella and the joint.  Well corticated bony densities noted along the anterior tibial tuberosity, likely related to old injury.  No acute fracture, subluxation or dislocation.  No joint effusion.  IMPRESSION: Anterior soft tissue swelling.  No acute bony abnormality.   Original Report Authenticated By: Charlett Nose, M.D.    Ct Maxillofacial Wo Cm  12/03/2012  *RADIOLOGY REPORT*  Clinical Data:  Recent fall, history of Parkinson's disease, increased spasticity with weakness  CT HEAD WITHOUT CONTRAST CT MAXILLOFACIAL WITHOUT CONTRAST CT CERVICAL SPINE WITHOUT CONTRAST  Technique:  Multidetector CT imaging of the head, cervical spine, and maxillofacial structures were performed using the standard protocol without intravenous contrast. Multiplanar CT image reconstructions of the cervical spine and maxillofacial structures were also generated.   Comparison:  CT brain scan of 08/04/2012  CT HEAD  Findings: The ventricular system remains prominent, and the cortical sulci are prominent consistent with diffuse entry.  The septum is in a normal midline position.  Moderate small vessel ischemic change is stable throughout the periventricular white matter.  No hemorrhage, mass lesion, or acute infarction is seen. There does appear to be a right facial hematoma overlying the zygomatic arch.  IMPRESSION: Atrophy and small vessel disease.  Right facial hematoma.  CT MAXILLOFACIAL  Findings:  A superficial hematoma overlies the right zygomatic arch.  However no fracture of the zygomatic arch is seen.  The orbital rims also appear intact.  The mandible is intact and mandibular condyles are in normal position.  The nasal bone appears intact. The paranasal sinuses appear intact.  IMPRESSION: No maxillofacial fracture.  Hematoma overlies the right zygomatic arch.  CT CERVICAL SPINE  Findings:   The cervical vertebrae are in normal alignment.  Only mild degenerative  disc disease is noted at C5-6 with some loss of disc space and spurring with sclerosis.  The odontoid process is intact.  No cervical spine fracture is seen.  No prevertebral soft tissue swelling is noted.  The thyroid gland is unremarkable.  No adenopathy is seen throughout the neck.  IMPRESSION: Normal alignment with degenerative disc disease at C5-6.  No acute cervical spine fracture.   Original Report Authenticated By: Dwyane Dee, M.D.    Other results:  EKG: Normal sinus rhythm at 97 beats per minute, left anterior hemiblock, no significant Q waves, no LVH, poor R wave progression, no ST changes, T wave inversions in a strain pattern in leads I and aVL. No comparisons available.  Assessment & Plan by Problem:  Mr. Arkwright is a 62 year old man who presents with a one-month history of increasing falls. Upon presentation he was found to be dehydrated with prerenal azotemia. This could have precipitated  some of his falls. He is on Lasix for unclear reasons. 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 This too can lead to increased falls.  Finally, and most likely, the cause of his falls may be related to excessive medications. Polypharmacy would explain some of the somnolence that was found on his exam upon admission and be a likely culprit of his falling without other more obvious reasons.  1) Falls: He has been hydrated with normal saline at 150 cc per minute. With this, there has been a marked improvement in his renal function overnight. His blood pressure medications have also been held and his blood pressure remains within normal limits. We will do an extensive review of his medications and determine which ones are absolutely indicated as well as which ones may have no current indication. For all of the medications without a current indication we will discontinue them in hopes of cleaning up his medication list and improving what is likely to be symptomatic polypharmacy.  2) Chronic medical conditions: Therapy for his chronic medical conditions will continue after an extensive review of his medications. We may modify his regimen in order to simplify it and decrease the likelihood of symptomatic polypharmacy.  3) Pulmonary embolism: He is currently supratherapeutic on his anticoagulation. We have been holding his Coumadin but will restart it when appropriate to maintain a therapeutic INR of 2-3. We believe if we can control his polypharmacy we may remove what is a concern for continued anticoagulation in the setting of falls.  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 We will consider consulting Neurology to assist with medication adjustments to maximize our pharmacologic therapy and decrease the possibility this is a cause of his falls.  5) Disposition: Once we clean up Mr.  Cope medication regimen and resolve his prerenal azotemia we will ask physical therapy to resume work with him. He will require placement upon discharge for further rehabilitation.

## 2012-12-04 NOTE — Evaluation (Signed)
Occupational Therapy Evaluation Patient Details Name: Derek Hicks MRN: [id number] DOB: [birth year] Today's Date: 12/04/2012 Time: 4098-1191 OT Time Calculation (min): 20 min  OT Assessment / Plan / Recommendation Clinical Impression  This 62 yo male admitted with increased SOB and falls (h/o PE in 10/2011, HTN, COPD, Bipolar 1 d/o, drug-induced parkinsonism, anxiety, and hypothyroidism, right rotator cuff repair) presents to acute OT with problems below. Will benefit from acute OT with follow up OT at SNF.    OT Assessment  Patient needs continued OT Services    Follow Up Recommendations  SNF    Barriers to Discharge None    Equipment Recommendations  None recommended by OT       Frequency  Min 2X/week    Precautions / Restrictions Precautions Precautions: Fall Precaution Comments: old right rotator cuff injury and surgery Restrictions Weight Bearing Restrictions: No   Pertinent Vitals/Pain Left rib area when I was assisting him with rolling right    ADL  Eating/Feeding: +1 Total assistance (due to tremors and stiff movements') Where Assessed - Eating/Feeding: Bed level Grooming: Simulated;Maximal assistance (due to tremors and stiff movements) Where Assessed - Grooming: Supine, head of bed up Upper Body Bathing: Simulated;Maximal assistance (due to tremors and stiff movements) Where Assessed - Upper Body Bathing: Supine, head of bed up Lower Body Bathing: Simulated;+2 Total assistance (needed to roll) Lower Body Bathing: Patient Percentage: 0% Where Assessed - Lower Body Bathing: Supine, head of bed up;Supine, head of bed flat;Rolling right and/or left Upper Body Dressing: Simulated;+2 Total assistance (to roll) Upper Body Dressing: Patient Percentage: 0% Where Assessed - Upper Body Dressing: Supine, head of bed up;Rolling right and/or left;Supine, head of bed flat Lower Body Dressing: Simulated;+2 Total assistance (to roll) Lower Body Dressing: Patient Percentage:  0% Where Assessed - Lower Body Dressing: Supine, head of bed up;Supine, head of bed flat;Rolling right and/or left Transfers/Ambulation Related to ADLs: Unable due to unsafe at this time with +1 A    OT Diagnosis: Generalized weakness;Acute pain  OT Problem List: Decreased strength;Decreased range of motion;Impaired balance (sitting and/or standing);Decreased knowledge of use of DME or AE;Pain;Impaired UE functional use;Increased edema OT Treatment Interventions: Self-care/ADL training;Therapeutic exercise;DME and/or AE instruction;Therapeutic activities;Patient/family education;Balance training   OT Goals Acute Rehab OT Goals OT Goal Formulation: With patient Time For Goal Achievement: 12/18/12 Potential to Achieve Goals: Fair ADL Goals Pt Will Perform Eating: with mod assist;Supported;Supine, head of bed up;Sitting, chair (arm supported at elbow, 10 bites) ADL Goal: Eating - Progress: Goal set today Pt Will Perform Grooming: with mod assist;Supported;Sitting, chair;Supine, head of bed up (1 task, with elbow supported) ADL Goal: Grooming - Progress: Goal set today Pt Will Transfer to Toilet: with max assist;Squat pivot transfer;Stand pivot transfer;3-in-1 ADL Goal: Toilet Transfer - Progress: Goal set today Miscellaneous OT Goals Miscellaneous OT Goal #1: Pt will be able to roll right with mod A to A with BADLs. OT Goal: Miscellaneous Goal #1 - Progress: Goal set today Miscellaneous OT Goal #2: Pt will be able to roll left with mod A to A with BADLs. OT Goal: Miscellaneous Goal #2 - Progress: Goal set today Miscellaneous OT Goal #3: Pt will be able to come up to six EOB with MAx A in prep for transfers OT Goal: Miscellaneous Goal #3 - Progress: Goal set today  Visit Information  Last OT Received On: 12/04/12 Assistance Needed: +2    Subjective Data  Subjective: I was self sufficient up until about 4 weeks ago   Prior  Functioning     Home Living Available Help at Discharge:  Skilled Nursing Facility Prior Function Comments: Per pt he has been at [facility] since June of last year due to his wife felt like she could not safely leave him at home while she worked. He also has a son. He reports that as of one month ago he was up walking without AD and doing all of his B/D/toileting Communication Communication:  (stutters, says it has gotten worse) Dominant Hand: Right         Vision/Perception Vision - History Baseline Vision: Wears glasses all the time (has a new prescription that he has not gotten yet)   Cognition  Cognition Overall Cognitive Status: Appears within functional limits for tasks assessed/performed Arousal/Alertness: Awake/alert Orientation Level: Appears intact for tasks assessed Behavior During Session: [facility] for tasks performed    Extremity/Trunk Assessment Right Upper Extremity Assessment RUE ROM/Strength/Tone: Deficits RUE ROM/Strength/Tone Deficits: grossly 3/5 except shoulder, tremors RUE Coordination: Deficits RUE Coordination Deficits: both FM and GM due to tremors Left Upper Extremity Assessment LUE ROM/Strength/Tone:  (grossly 3/5) LUE Coordination: Deficits LUE Coordination Deficits: both FM and GM due to tremors     Mobility Bed Mobility Bed Mobility: Rolling Right;Rolling Left;Scooting to HOB Rolling Right: 1: +1 Total assist;With rail Rolling Left: 1: +1 Total assist;With rail Scooting to HOB: 3: Mod assist;With rail (headboard, trendelenberg, pushing with LLE) Details for Bed Mobility Assistance: VCs for entire sequence           End of Session OT - End of Session Activity Tolerance: Patient limited by pain Patient left: in bed;with call bell/phone within reach Nurse Communication:  (If up need to use maxi move or sky)       Evette Georges [phone] 12/04/2012, 3:11 PM

## 2012-12-05 ENCOUNTER — Inpatient Hospital Stay (HOSPITAL_COMMUNITY): Payer: Medicare Other

## 2012-12-05 DIAGNOSIS — N179 Acute kidney failure, unspecified: Principal | ICD-10-CM

## 2012-12-05 DIAGNOSIS — F1921 Other psychoactive substance dependence, in remission: Secondary | ICD-10-CM | POA: Diagnosis not present

## 2012-12-05 DIAGNOSIS — Z79899 Other long term (current) drug therapy: Secondary | ICD-10-CM

## 2012-12-05 LAB — BASIC METABOLIC PANEL
BUN: 12 mg/dL (ref 6–23)
CO2: 25 mEq/L (ref 19–32)
Calcium: 9 mg/dL (ref 8.4–10.5)
GFR calc non Af Amer: 71 mL/min — ABNORMAL LOW (ref >90)
Glucose, Bld: 125 mg/dL — ABNORMAL HIGH (ref 70–99)
Sodium: 139 mEq/L (ref 135–145)

## 2012-12-05 LAB — LIPID PANEL
HDL: 38 mg/dL — ABNORMAL LOW (ref >39)
LDL Cholesterol: 81 mg/dL (ref 0–99)
Triglycerides: 200 mg/dL — ABNORMAL HIGH (ref <150)
VLDL: 40 mg/dL (ref 0–40)

## 2012-12-05 LAB — PROTIME-INR: Prothrombin Time: 25.9 seconds — ABNORMAL HIGH (ref 11.6–15.2)

## 2012-12-05 MED ORDER — OLANZAPINE 10 MG PO TABS
10.0000 mg | ORAL_TABLET | Freq: Every day | ORAL | Status: DC
  Administered 2012-12-05 – 2012-12-07 (×3): 10 mg via ORAL
  Filled 2012-12-05 (×4): qty 1

## 2012-12-05 MED ORDER — DIVALPROEX SODIUM 125 MG PO CPSP
125.0000 mg | ORAL_CAPSULE | Freq: Every day | ORAL | Status: DC
  Administered 2012-12-06 – 2012-12-08 (×3): 125 mg via ORAL
  Filled 2012-12-05 (×3): qty 1

## 2012-12-05 MED ORDER — POLYETHYLENE GLYCOL 3350 17 G PO PACK
17.0000 g | PACK | Freq: Once | ORAL | Status: AC
  Administered 2012-12-05: 17 g via ORAL
  Filled 2012-12-05: qty 1

## 2012-12-05 MED ORDER — CARBIDOPA-LEVODOPA 25-100 MG PO TABS
1.0000 | ORAL_TABLET | Freq: Three times a day (TID) | ORAL | Status: DC
  Administered 2012-12-05 – 2012-12-08 (×10): 1 via ORAL
  Filled 2012-12-05 (×11): qty 1

## 2012-12-05 MED ORDER — OXCARBAZEPINE 300 MG PO TABS
300.0000 mg | ORAL_TABLET | Freq: Two times a day (BID) | ORAL | Status: DC
  Administered 2012-12-05 – 2012-12-08 (×6): 300 mg via ORAL
  Filled 2012-12-05 (×7): qty 1

## 2012-12-05 MED ORDER — OXCARBAZEPINE 150 MG PO TABS
150.0000 mg | ORAL_TABLET | Freq: Two times a day (BID) | ORAL | Status: DC
  Filled 2012-12-05 (×2): qty 1

## 2012-12-05 MED ORDER — WARFARIN - PHARMACIST DOSING INPATIENT: Freq: Every day | Status: DC

## 2012-12-05 MED ORDER — WARFARIN 0.5 MG HALF TABLET
0.5000 mg | ORAL_TABLET | Freq: Once | ORAL | Status: AC
  Administered 2012-12-05: 0.5 mg via ORAL
  Filled 2012-12-05: qty 1

## 2012-12-05 NOTE — Progress Notes (Signed)
Foley catheter removed per order.

## 2012-12-05 NOTE — Consult Note (Addendum)
NEURO HOSPITALIST CONSULT NOTE    Reason for Consult: medication adjustment for parkinson's.   HPI:                                                                                                                                          Derek Hicks is an 62 y.o. male with significant past medical history of HTN, COPD, Bipolar 1 d/o, drug-induced parkinsonism, anxiety, and hypothyroidism presents with frequent falls. Patient has had multiple falls within the last week along with SOB, which is the reason he was brought to the ED. On arrival to the ED he was dehydrated, in AKI, WBC 15.5 and elevated INR--patient is on coumadin secondary to history of PE. Per chart, nurse in the nurses in the nursing facility were worried his falls might be due to his parkinson's symptoms of shuffling gait and increased stiffness. Internal medicine resident has contacted the nursing staff who states his Sinemet and parkinson's is followed by the nursing home medical staff and his doses have not been changed in past two years.  Patient does not believe his symptoms have worsened but does admit to having difficulty with climbing stairs and tripping over objects on the floor due to shuffled gait. HE has not seen a actual Neurologist for his parkinson's. On exam he does show mild increased tone in his bilateral UE and cog wheeling bilaterally right greater than left.  His lower extremities do not show significant rigidity but does show bradykinesia.    Past Medical History  Diagnosis Date  . COPD (chronic obstructive pulmonary disease)   . Pulmonary embolus   . Hypertension   . Bipolar 1 disorder   . Neuroleptic-induced Parkinsonism   . Anxiety   . Hyperlipidemia   . Falls infrequently   . Current use of long term anticoagulation   . Hypothyroidism   . Shortness of breath     Past Surgical History  Procedure Laterality Date  . Rotator cuff repair      x 2     Family History  Problem  Relation Age of Onset  . Aneurysm Mother   . Other      No family history of DVT or PE    Social History:  reports that he quit smoking about 10 years ago. He has never used smokeless tobacco. He reports that he does not drink alcohol or use illicit drugs.  Allergies  Allergen Reactions  . Benzodiazepines Other (See Comments)    Causes aggression, paradoxical reaction  . Other     Anti-anxiety medications    MEDICATIONS:  Prior to Admission:  Prescriptions prior to admission  Medication Sig Dispense Refill  . acetaminophen (TYLENOL) 500 MG tablet Take 500 mg by mouth every 4 (four) hours as needed. For pain/fever over 101.      . albuterol (PROVENTIL HFA;VENTOLIN HFA) 108 (90 BASE) MCG/ACT inhaler Inhale 2 puffs into the lungs every 6 (six) hours as needed. For shortness of breath      . Alum & Mag Hydroxide-Simeth (MI-ACID MAXIMUM STRENGTH PO) Take 30 mLs by mouth 4 (four) times daily as needed. For heartburn      . amLODipine (NORVASC) 5 MG tablet Take 5 mg by mouth daily.      . benztropine (COGENTIN) 1 MG tablet Take 1 mg by mouth 2 (two) times daily.      . carbidopa-levodopa (SINEMET IR) 25-100 MG per tablet Take 0.5 tablets by mouth 3 (three) times daily.      . divalproex (DEPAKOTE ER) 250 MG 24 hr tablet Take 250 mg by mouth daily.      . fenofibrate (TRICOR) 145 MG tablet Take 145 mg by mouth daily.      . Fluticasone-Salmeterol (ADVAIR) 250-50 MCG/DOSE AEPB Inhale 1 puff into the lungs 2 (two) times daily.      . furosemide (LASIX) 20 MG tablet Take 20 mg by mouth daily.      Marland Kitchen guaiFENesin (ROBITUSSIN) 100 MG/5ML SOLN Take 10 mLs by mouth every 6 (six) hours as needed. For cough.      Marland Kitchen HYDROcodone-acetaminophen (VICODIN) 5-500 MG per tablet Take 1 tablet by mouth every 6 (six) hours as needed for pain.      Marland Kitchen lactulose (CHRONULAC) 10 GM/15ML solution Take 20 g  by mouth 2 (two) times daily.      Marland Kitchen levothyroxine (SYNTHROID, LEVOTHROID) 75 MCG tablet Take 75 mcg by mouth daily.      Marland Kitchen lithium carbonate (LITHOBID) 300 MG CR tablet Take 900 mg by mouth at bedtime.      Marland Kitchen loperamide (IMODIUM A-D) 2 MG tablet Take 2 mg by mouth 4 (four) times daily as needed. For diarrhea      . losartan (COZAAR) 50 MG tablet Take 50 mg by mouth daily.      . magnesium hydroxide (MILK OF MAGNESIA) 400 MG/5ML suspension Take 30 mLs by mouth daily as needed. For heartburn      . metoprolol (LOPRESSOR) 100 MG tablet Take 100 mg by mouth 2 (two) times daily.      Marland Kitchen OLANZapine (ZYPREXA) 20 MG tablet Take 20 mg by mouth at bedtime.      . Oxcarbazepine (TRILEPTAL) 300 MG tablet Take 300 mg by mouth 2 (two) times daily.      . potassium chloride (K-DUR,KLOR-CON) 10 MEQ tablet Take 10 mEq by mouth daily.      . pravastatin (PRAVACHOL) 20 MG tablet Take 20 mg by mouth daily.      Marland Kitchen tiotropium (SPIRIVA) 18 MCG inhalation capsule Place 18 mcg into inhaler and inhale daily.      . traZODone (DESYREL) 100 MG tablet Take 100 mg by mouth at bedtime.      . vitamin B-12 (CYANOCOBALAMIN) 1000 MCG tablet Take 1,000 mcg by mouth daily.      Marland Kitchen warfarin (COUMADIN) 1 MG tablet Take 1 mg by mouth 2 (two) times a week. Tuesday and Thursday      . warfarin (COUMADIN) 2.5 MG tablet Take 2.5 mg by mouth daily. All days except for Tuesday and Thursday      .  furosemide (LASIX) 20 MG tablet Take 1 tablet (20 mg total) by mouth daily.  30 tablet  0  . potassium chloride (K-DUR,KLOR-CON) 10 MEQ tablet Take 1 tablet (10 mEq total) by mouth daily.  30 tablet  0  . tiotropium (SPIRIVA) 18 MCG inhalation capsule Place 1 capsule (18 mcg total) into inhaler and inhale daily.  30 capsule  0   Scheduled: . antiseptic oral rinse  15 mL Mouth Rinse q12n4p  . benztropine  1 mg Oral BID  . carbidopa-levodopa  0.5 tablet Oral TID  . chlorhexidine  15 mL Mouth Rinse BID  . divalproex  250 mg Oral Daily  .  levothyroxine  75 mcg Oral QAC breakfast  . lithium carbonate  900 mg Oral QHS  . mometasone-formoterol  2 puff Inhalation BID  . OLANZapine  20 mg Oral QHS  . OXcarbazepine  300 mg Oral BID  . polyethylene glycol  17 g Oral Once  . senna-docusate  2 tablet Oral BID  . simvastatin  10 mg Oral q1800  . tiotropium  18 mcg Inhalation Daily  . traZODone  100 mg Oral QHS  . vitamin B-12  1,000 mcg Oral Daily     ROS:                                                                                                                                       History obtained from the patient  General ROS: negative for - chills, fatigue, fever, night sweats, weight gain or weight loss Psychological ROS: negative for - behavioral disorder, hallucinations, memory difficulties, mood swings or suicidal ideation Ophthalmic ROS: negative for - blurry vision, double vision, eye pain or loss of vision ENT ROS: negative for - epistaxis, nasal discharge, oral lesions, sore throat, tinnitus or vertigo Allergy and Immunology ROS: negative for - hives or itchy/watery eyes Hematological and Lymphatic ROS: negative for - bleeding problems, bruising or swollen lymph nodes Endocrine ROS: negative for - galactorrhea, hair pattern changes, polydipsia/polyuria or temperature intolerance Respiratory ROS: negative for - cough, hemoptysis, shortness of breath or wheezing Cardiovascular ROS: negative for - chest pain, dyspnea on exertion, edema or irregular heartbeat Gastrointestinal ROS: negative for - abdominal pain, diarrhea, hematemesis, nausea/vomiting or stool incontinence Genito-Urinary ROS: negative for - dysuria, hematuria, incontinence or urinary frequency/urgency Musculoskeletal ROS: negative for - joint swelling or muscular weakness Neurological ROS: as noted in HPI Dermatological ROS: negative for rash and skin lesion changes   Blood pressure 118/64, pulse 96, temperature 97.8 F (36.6 C), temperature source  Oral, resp. rate 20, height 5\' 8"  (1.727 m), weight 104.9 kg (231 lb 4.2 oz), SpO2 97.00%.   Neurologic Examination:  Mental Status: Alert, oriented to place not year or month ,speech fluent but often times he stutters and is hesitant prior to speaking. No evidence of aphasia.  Able to follow 3 step commands without difficulty. Cranial Nerves: II: Discs flat bilaterally; Visual fields grossly normal, pupils equal, round, reactive to light and accommodation III,IV, VI: ptosis not present, extra-ocular motions intact bilaterally V,VII: smile symmetric, facial light touch sensation normal bilaterally VIII: hearing normal bilaterally IX,X: gag reflex present XI: bilateral shoulder shrug XII: midline tongue extension Motor: Right : Upper extremity   4/5    Left:     Upper extremity   4/5  Lower extremity   4/5     Lower extremity   4/5 Tone and bulk:increased tone in UE greater than LE, positive cogwheeling bilateral right>left,positive bradykinesia when lifting his lower legs.  resting tremor in right hand.  Sensory: Pinprick and light touch intact throughout, bilaterally Deep Tendon Reflexes: 2+ and symmetric throughout with no AJ Plantars: Right: downgoing   Left: downgoing Cerebellar: Finger to nose without dysmetria, btu with significant course postural tremor bilaterally.  CV: pulses palpable throughout    Lab Results  Component Value Date/Time   CHOL 159 12/05/2012  6:22 AM    Results for orders placed during the hospital encounter of 12/03/12 (from the past 48 hour(s))  MRSA PCR SCREENING     Status: None   Collection Time    12/03/12  5:10 PM      Result Value Range   MRSA by PCR NEGATIVE  NEGATIVE   Comment:            The GeneXpert MRSA Assay (FDA     approved for NASAL specimens     only), is one component of a     comprehensive MRSA colonization     surveillance  program. It is not     intended to diagnose MRSA     infection nor to guide or     monitor treatment for     MRSA infections.  URINALYSIS, ROUTINE W REFLEX MICROSCOPIC     Status: Abnormal   Collection Time    12/03/12  6:27 PM      Result Value Range   Color, Urine AMBER (*) YELLOW   Comment: BIOCHEMICALS MAY BE AFFECTED BY COLOR   APPearance CLOUDY (*) CLEAR   Specific Gravity, Urine 1.024  1.005 - 1.030   pH 5.5  5.0 - 8.0   Glucose, UA NEGATIVE  NEGATIVE mg/dL   Hgb urine dipstick TRACE (*) NEGATIVE   Bilirubin Urine SMALL (*) NEGATIVE   Ketones, ur 15 (*) NEGATIVE mg/dL   Protein, ur 30 (*) NEGATIVE mg/dL   Urobilinogen, UA 1.0  0.0 - 1.0 mg/dL   Nitrite NEGATIVE  NEGATIVE   Leukocytes, UA TRACE (*) NEGATIVE  CREATININE, URINE, RANDOM     Status: None   Collection Time    12/03/12  6:27 PM      Result Value Range   Creatinine, Urine 251.21    SODIUM, URINE, RANDOM     Status: None   Collection Time    12/03/12  6:27 PM      Result Value Range   Sodium, Ur 29    URINE MICROSCOPIC-ADD ON     Status: Abnormal   Collection Time    12/03/12  6:27 PM      Result Value Range   Squamous Epithelial / LPF FEW (*) RARE   WBC, UA 3-6  <3 WBC/hpf  RBC / HPF 0-2  <3 RBC/hpf   Bacteria, UA RARE  RARE   Casts HYALINE CASTS (*) NEGATIVE   Comment: RARE GRANULAR CASTS PRESENT   Crystals CA OXALATE CRYSTALS (*) NEGATIVE  LITHIUM LEVEL     Status: None   Collection Time    12/03/12  7:37 PM      Result Value Range   Lithium Lvl 1.26  0.80 - 1.40 mEq/L  COMPREHENSIVE METABOLIC PANEL     Status: Abnormal   Collection Time    12/04/12  6:47 AM      Result Value Range   Sodium 139  135 - 145 mEq/L   Potassium 4.0  3.5 - 5.1 mEq/L   Chloride 108  96 - 112 mEq/L   CO2 25  19 - 32 mEq/L   Glucose, Bld 113 (*) 70 - 99 mg/dL   BUN 19  6 - 23 mg/dL   Creatinine, Ser 0.98 (*) 0.50 - 1.35 mg/dL   Comment: DELTA CHECK NOTED   Calcium 8.4  8.4 - 10.5 mg/dL   Total Protein 5.4 (*) 6.0  - 8.3 g/dL   Albumin 2.5 (*) 3.5 - 5.2 g/dL   AST 33  0 - 37 U/L   ALT 5  0 - 53 U/L   Alkaline Phosphatase 39  39 - 117 U/L   Total Bilirubin 0.5  0.3 - 1.2 mg/dL   GFR calc non Af Amer 53 (*) >90 mL/min   GFR calc Af Amer 62 (*) >90 mL/min   Comment:            The eGFR has been calculated     using the CKD EPI equation.     This calculation has not been     validated in all clinical     situations.     eGFR's persistently     <90 mL/min signify     possible Chronic Kidney Disease.  CBC     Status: Abnormal   Collection Time    12/04/12  6:47 AM      Result Value Range   WBC 10.7 (*) 4.0 - 10.5 K/uL   RBC 3.26 (*) 4.22 - 5.81 MIL/uL   Hemoglobin 9.7 (*) 13.0 - 17.0 g/dL   Comment: REPEATED TO VERIFY   HCT 29.7 (*) 39.0 - 52.0 %   MCV 91.1  78.0 - 100.0 fL   MCH 29.8  26.0 - 34.0 pg   MCHC 32.7  30.0 - 36.0 g/dL   RDW 11.9  14.7 - 82.9 %   Platelets 301  150 - 400 K/uL  PROTIME-INR     Status: Abnormal   Collection Time    12/04/12  6:47 AM      Result Value Range   Prothrombin Time 37.0 (*) 11.6 - 15.2 seconds   INR 4.06 (*) 0.00 - 1.49  PROTIME-INR     Status: Abnormal   Collection Time    12/05/12  6:22 AM      Result Value Range   Prothrombin Time 25.9 (*) 11.6 - 15.2 seconds   INR 2.51 (*) 0.00 - 1.49  TSH     Status: None   Collection Time    12/05/12  6:22 AM      Result Value Range   TSH 1.287  0.350 - 4.500 uIU/mL  BASIC METABOLIC PANEL     Status: Abnormal   Collection Time    12/05/12  6:22 AM      Result  Value Range   Sodium 139  135 - 145 mEq/L   Potassium 4.2  3.5 - 5.1 mEq/L   Chloride 107  96 - 112 mEq/L   CO2 25  19 - 32 mEq/L   Glucose, Bld 125 (*) 70 - 99 mg/dL   BUN 12  6 - 23 mg/dL   Creatinine, Ser 9.60  0.50 - 1.35 mg/dL   Calcium 9.0  8.4 - 45.4 mg/dL   GFR calc non Af Amer 71 (*) >90 mL/min   GFR calc Af Amer 83 (*) >90 mL/min   Comment:            The eGFR has been calculated     using the CKD EPI equation.     This  calculation has not been     validated in all clinical     situations.     eGFR's persistently     <90 mL/min signify     possible Chronic Kidney Disease.  LIPID PANEL     Status: Abnormal   Collection Time    12/05/12  6:22 AM      Result Value Range   Cholesterol 159  0 - 200 mg/dL   Triglycerides 098 (*) <150 mg/dL   HDL 38 (*) >11 mg/dL   Total CHOL/HDL Ratio 4.2     VLDL 40  0 - 40 mg/dL   LDL Cholesterol 81  0 - 99 mg/dL   Comment:            Total Cholesterol/HDL:CHD Risk     Coronary Heart Disease Risk Table                         Men   Women      1/2 Average Risk   3.4   3.3      Average Risk       5.0   4.4      2 X Average Risk   9.6   7.1      3 X Average Risk  23.4   11.0                Use the calculated Patient Ratio     above and the CHD Risk Table     to determine the patient's CHD Risk.                ATP III CLASSIFICATION (LDL):      <100     mg/dL   Optimal      914-782  mg/dL   Near or Above                        Optimal      130-159  mg/dL   Borderline      956-213  mg/dL   High      >086     mg/dL   Very High    Dg Chest 2 View  12/05/2012  *RADIOLOGY REPORT*  Clinical Data: Fall with shortness of breath and left-sided chest/rib pain.  CHEST - 2 VIEW  Comparison: 12/03/2012  Findings: Lung volumes remain very low with bibasilar atelectasis present as well as potential small bilateral pleural effusions.  No pneumothorax, mediastinal widening or fractures are identified.  IMPRESSION: Low lung volumes with bibasilar atelectasis and potential small bilateral pleural effusions.   Original Report Authenticated By: Irish Lack, M.D.    Felicie Morn PA-C [facility] [phone]  12/05/2012, 11:50 AM  Assessment/Plan: I have seen and evaluated the patient. I have reviewed the above note and made appropriate changes.   He is a poor historian and I attempted to call his wife, but received no answer. I am unclear as to why he is nursing home  bound unless he truly does have a diagnosis of dementia.   The patient has some mild confusion, but states that he has alzheimer's though I am not sure if this has been formally diagnosed in the past. He is currently on multiple mood stabilizers  including depakote, oxcarbazepine, lithium, and olanzapine.   He does have some cogwheeling rigidity and this could be due to underlying parkinson's disease, but also could be related to olanzapine which is the medication that he is on most associated with parkinsonism. Cogentin is the mainstay of therapy for drug-induced parkinsonism, but as with any anti-cholinergic could significantly worsen dementia/delirium if this is a true diagnosis in him.   His tremor, confusion, and increased falls could be concerning for chronic lithium toxicity though it is unclear to me how long he has been on this medication.   I would favor simplifying his regimen and if possible, avoiding antipsychotics in this patient. Seroquel has the least extrapyramidal effects of the commonly used antipsychotics and may be a choice, especially if use could be limited to small doses.   If the patient did have symptoms prior to anti-dopaminergic medications, then sinemet would make sense and there is much room to increase this,  but cogentin really would be the mainstay of therapy if this is drug induced parkinsonism.   1) Appreciate psychiatry assistance 2) Would consider limiting antidopaminergic medications 3) If able to discontinue anti-psychotics, would plan to decrease cogentin in a few weeks.   Ritta Slot, MD [facility] [phone]  If 7pm- 7am, please page neurology on call at [phone].

## 2012-12-05 NOTE — Progress Notes (Signed)
Internal Medicine Attending  Date: 12/05/2012  Patient name: JARL SELLITTO Medical record number: [id number] Date of birth: [birth year] Age: 62 y.o. Gender: male  I saw and evaluated the patient. I reviewed the resident's note by Dr. Collier Bullock and I agree with the resident's findings and plans as documented in her progress note.  Mr. Nemetz is feeling better today. He admits that many of the medications made him feel poorly. Surely polypharmacy has been a major contributor to his recent falls and mental status changes. 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 The main recommendation was to stop all antipsychotics if psychiatry felt that was appropriate. We are currently awaiting psychiatry's recommendations and hopefully can limit his medications even more than we have. Once this evaluation is complete and his medication list is much smaller and more appropriate he should be ready for discharge back to the skilled nursing facility.

## 2012-12-05 NOTE — Consult Note (Signed)
Patient Identification:  Derek Hicks Date of Evaluation:  12/05/2012 Reason for Consult: Bipolar DO, drug-induced Parkinsonism  Referring Provider: Dr. Collier Bullock FPTS  History of Present Illness: Pt is brought from [facility] with history of HTN, COPD, Bipolar DO, drug-induced parkinsonism, anxiety and hypothroidism.  He is brought to [facility] ED    Past Psychiatric History  Pt has used many drugs, can't say when: cannabis, LSD, PCP, mushrooms, cocaine, crack cocaine. Heroin [1 time], alcohol, 1+ 6 pack.  He went to rehab twice.  He denies history of suicidal ideation or attempt.   Past Medical History:     Past Medical History  Diagnosis Date  . COPD (chronic obstructive pulmonary disease)   . Pulmonary embolus   . Hypertension   . Bipolar 1 disorder   . Neuroleptic-induced Parkinsonism   . Anxiety   . Hyperlipidemia   . Falls infrequently   . Current use of long term anticoagulation   . Hypothyroidism   . Shortness of breath        Past Surgical History  Procedure Laterality Date  . Rotator cuff repair      x 2     Allergies:  Allergies  Allergen Reactions  . Benzodiazepines Other (See Comments)    Causes aggression, paradoxical reaction  . Other     Anti-anxiety medications    Current Medications:  Prior to Admission medications   Medication Sig Start Date End Date Taking? Authorizing Provider  acetaminophen (TYLENOL) 500 MG tablet Take 500 mg by mouth every 4 (four) hours as needed. For pain/fever over 101.   Yes Historical Provider, MD  albuterol (PROVENTIL HFA;VENTOLIN HFA) 108 (90 BASE) MCG/ACT inhaler Inhale 2 puffs into the lungs every 6 (six) hours as needed. For shortness of breath   Yes Historical Provider, MD  Alum & Mag Hydroxide-Simeth (MI-ACID MAXIMUM STRENGTH PO) Take 30 mLs by mouth 4 (four) times daily as needed. For heartburn   Yes Historical Provider, MD  amLODipine (NORVASC) 5 MG tablet Take 5 mg by mouth daily.   Yes Historical Provider, MD   benztropine (COGENTIN) 1 MG tablet Take 1 mg by mouth 2 (two) times daily.   Yes Historical Provider, MD  carbidopa-levodopa (SINEMET IR) 25-100 MG per tablet Take 0.5 tablets by mouth 3 (three) times daily.   Yes Historical Provider, MD  divalproex (DEPAKOTE ER) 250 MG 24 hr tablet Take 250 mg by mouth daily.   Yes Historical Provider, MD  fenofibrate (TRICOR) 145 MG tablet Take 145 mg by mouth daily.   Yes Historical Provider, MD  Fluticasone-Salmeterol (ADVAIR) 250-50 MCG/DOSE AEPB Inhale 1 puff into the lungs 2 (two) times daily.   Yes Historical Provider, MD  furosemide (LASIX) 20 MG tablet Take 20 mg by mouth daily.   Yes Historical Provider, MD  guaiFENesin (ROBITUSSIN) 100 MG/5ML SOLN Take 10 mLs by mouth every 6 (six) hours as needed. For cough.   Yes Historical Provider, MD  HYDROcodone-acetaminophen (VICODIN) 5-500 MG per tablet Take 1 tablet by mouth every 6 (six) hours as needed for pain.   Yes Historical Provider, MD  lactulose (CHRONULAC) 10 GM/15ML solution Take 20 g by mouth 2 (two) times daily.   Yes Historical Provider, MD  levothyroxine (SYNTHROID, LEVOTHROID) 75 MCG tablet Take 75 mcg by mouth daily.   Yes Historical Provider, MD  lithium carbonate (LITHOBID) 300 MG CR tablet Take 900 mg by mouth at bedtime.   Yes Historical Provider, MD  loperamide (IMODIUM A-D) 2 MG tablet Take  2 mg by mouth 4 (four) times daily as needed. For diarrhea   Yes Historical Provider, MD  losartan (COZAAR) 50 MG tablet Take 50 mg by mouth daily.   Yes Historical Provider, MD  magnesium hydroxide (MILK OF MAGNESIA) 400 MG/5ML suspension Take 30 mLs by mouth daily as needed. For heartburn   Yes Historical Provider, MD  metoprolol (LOPRESSOR) 100 MG tablet Take 100 mg by mouth 2 (two) times daily.   Yes Historical Provider, MD  OLANZapine (ZYPREXA) 20 MG tablet Take 20 mg by mouth at bedtime.   Yes Historical Provider, MD  Oxcarbazepine (TRILEPTAL) 300 MG tablet Take 300 mg by mouth 2 (two) times  daily.   Yes Historical Provider, MD  potassium chloride (K-DUR,KLOR-CON) 10 MEQ tablet Take 10 mEq by mouth daily.   Yes Historical Provider, MD  pravastatin (PRAVACHOL) 20 MG tablet Take 20 mg by mouth daily.   Yes Historical Provider, MD  tiotropium (SPIRIVA) 18 MCG inhalation capsule Place 18 mcg into inhaler and inhale daily.   Yes Historical Provider, MD  traZODone (DESYREL) 100 MG tablet Take 100 mg by mouth at bedtime.   Yes Historical Provider, MD  vitamin B-12 (CYANOCOBALAMIN) 1000 MCG tablet Take 1,000 mcg by mouth daily.   Yes Historical Provider, MD  warfarin (COUMADIN) 1 MG tablet Take 1 mg by mouth 2 (two) times a week. Tuesday and Thursday   Yes Historical Provider, MD  warfarin (COUMADIN) 2.5 MG tablet Take 2.5 mg by mouth daily. All days except for Tuesday and Thursday   Yes Historical Provider, MD  furosemide (LASIX) 20 MG tablet Take 1 tablet (20 mg total) by mouth daily. 11/05/11 11/04/12  Simbiso Ranga, MD  potassium chloride (K-DUR,KLOR-CON) 10 MEQ tablet Take 1 tablet (10 mEq total) by mouth daily. 11/05/11 11/04/12  Simbiso Ranga, MD  tiotropium (SPIRIVA) 18 MCG inhalation capsule Place 1 capsule (18 mcg total) into inhaler and inhale daily. 11/05/11 11/04/12  Simbiso Ranga, MD    Social History:    reports that he quit smoking about 10 years ago. He has never used smokeless tobacco. He reports that he does not drink alcohol or use illicit drugs.   Family History:    Family History  Problem Relation Age of Onset  . Aneurysm Mother   . Other      No family history of DVT or PE    Mental Status Examination/Evaluation: Objective:  Appearance: obese and unshaven  Eye Contact::  Good  Speech:  stutters  Volume:  Normal  Mood:    Affect:  Congruent  Thought Process:  Disorganized, Loose and easily confused, poor historian   Orientation:  Other:  Pt is oriented to person place, marriage, little else  Thought Content:  problems organizing thoughts  Suicidal Thoughts:   No  Homicidal Thoughts:  No  Judgement:  Impaired  Insight:  Lacking   DIAGNOSIS:   AXIS I  h/o Bipolar 1, depressed, Polypharmacy Abuse, remote; Drug-induced Parkinsonism, Stuttering  AXIS II  Deferred  AXIS III See medical notes.  AXIS IV other psychosocial or environmental problems, problems related to social environment and Pt has shffling gate, frequent falls and complicated medical conditions.  AXIS V 41-50 serious symptoms   Assessment/Plan:  Discussed with Dr. On call FPTS, Dr. Corliss Blacker and Pharmacist; Psych CSW Pt has great difficulty providing a coherent history because of his intermittent confusion and stuttering. He is on multiple medications that have a potential of drug-drug interaction.  It is suggested that a slow  taper of these to simplify his regimen and then observe.  It is not known, yet, what his temperament has been at the [facility].  To simplify medications and then add on med[s] if necessary is the recommendation.  Lithium was most recently added, Depakote may contribute to parkinsonism and it may by enzymes induce elimination of other medications.  Saddie Benders is a D2 blocker and may not benefit patient who is getting Carbadopa to enhance dopamine concentration. RECOMMENDATION:  1.  Pt does not have capacity; need to rule out psychogenic symptoms 2.  Suggest use of Trileptal in lieu of Depakote - to be tapered to 125 mg for 4 days then DC 3.  Suggest taper to DC Lithium  900 mg -600 mg - 300 mg over a week.  F/u with lab test 4.  Suggest slower taper of olanzapine 20 mg - to 10 mg for 4 days, then 5 mg for 4 days, then 2.5 mg for 4 days      Then DC. 5.  Will follow pt.  Mickeal Skinner MD 12/05/2012 1:43 PM

## 2012-12-05 NOTE — Progress Notes (Signed)
Subjective: Patient states that he is doing better this morning. Still c/o back pain and SOB. States that he has felt his medications were overwhelming and has wanted to adjust his list since he has been taking so many.  Objective: Vital signs in last 24 hours: Filed Vitals:   12/04/12 2234 12/05/12 0410 12/05/12 0757 12/05/12 1352  BP: 131/79 118/64  125/73  Pulse: 94 96  97  Temp: 99 F (37.2 C) 97.8 F (36.6 C)  99.2 F (37.3 C)  TempSrc: Oral   Oral  Resp: 20 20  19   Height:      Weight:      SpO2: 96% 97% 97% 97%   Weight change:   Intake/Output Summary (Last 24 hours) at 12/05/12 1357 Last data filed at 12/05/12 1200  Gross per 24 hour  Intake   2235 ml  Output   2000 ml  Net    235 ml    Physical Exam Blood pressure 125/73, pulse 97, temperature 99.2 F (37.3 C), temperature source Oral, resp. rate 19, height 5\' 8"  (1.727 m), weight 231 lb 4.2 oz (104.9 kg), SpO2 97.00%. General: No acute distress, resting in bed, oriented to person and place, alert and interactive HEENT: Pupils 1-2 mm, minimally reactive to light, EOMI, right cheek hematoma, ecchymoses around the right eye  Cardiovascular: Regular rate and rhythm, no murmurs, rubs or gallops  Respiratory: Clear to auscultation bilaterally, no wheezes, rales, or rhonchi  Abdomen: Soft, slightly distended, reported epigastric tenderness, bowel sounds present, no rebound or guarding  Extremities: Warm and well-perfused, 2+ edema, left shin abrasion  Skin: Warm, dry, no rashes, good pulses  Neuro: More alert than admission, answers most questions appropriately with delayed response. He does sound slightly dysarthric. Resting tremor noted in upper extremities. Patient also has muscle spasms/twitching of his upper extremities when he tries to lift his hand to scratch his nose. Moving all 4 extremities spontaneously. 4/5 grip strength but will not raise arms on command. Unable to lift legs off the bed. Sensation to light touch  intact throughout. Stuttered speech, delayed responses   Lab Results: Basic Metabolic Panel:  Recent Labs Lab 12/04/12 0647 12/05/12 0622  NA 139 139  K 4.0 4.2  CL 108 107  CO2 25 25  GLUCOSE 113* 125*  BUN 19 12  CREATININE 1.39* 1.09  CALCIUM 8.4 9.0   Liver Function Tests:  Recent Labs Lab 12/03/12 0908 12/04/12 0647  AST 69* 33  ALT 14 5  ALKPHOS 43 39  BILITOT 0.6 0.5  PROT 6.9 5.4*  ALBUMIN 3.3* 2.5*    Recent Labs Lab 12/03/12 0908  LIPASE 39   CBC:  Recent Labs Lab 12/03/12 0908 12/04/12 0647  WBC 15.5* 10.7*  HGB 12.3* 9.7*  HCT 37.8* 29.7*  MCV 90.4 91.1  PLT 347 301   BNP:  Recent Labs Lab 12/03/12 0908  PROBNP 201.0*   Coagulation:  Recent Labs Lab 12/03/12 1120 12/04/12 0647 12/05/12 0622  LABPROT 44.7* 37.0* 25.9*  INR 5.15* 4.06* 2.51*   Anemia Panel: No results found for this basename: VITAMINB12, FOLATE, FERRITIN, TIBC, IRON, RETICCTPCT,  in the last 168 hours Urine Drug Screen: Drugs of Abuse     Component Value Date/Time   LABOPIA NONE DETECTED 07/15/2012 0503   COCAINSCRNUR NONE DETECTED 07/15/2012 0503   LABBENZ NONE DETECTED 07/15/2012 0503   AMPHETMU NONE DETECTED 07/15/2012 0503   THCU NONE DETECTED 07/15/2012 0503   LABBARB NONE DETECTED 07/15/2012 0503  Urinalysis:  Recent Labs Lab 12/03/12 1827  COLORURINE AMBER*  LABSPEC 1.024  PHURINE 5.5  GLUCOSEU NEGATIVE  HGBUR TRACE*  BILIRUBINUR SMALL*  KETONESUR 15*  PROTEINUR 30*  UROBILINOGEN 1.0  NITRITE NEGATIVE  LEUKOCYTESUR TRACE*     Micro Results: Recent Results (from the past 240 hour(s))  MRSA PCR SCREENING     Status: None   Collection Time    12/03/12  5:10 PM      Result Value Range Status   MRSA by PCR NEGATIVE  NEGATIVE Final   Comment:            The GeneXpert MRSA Assay (FDA     approved for NASAL specimens     only), is one component of a     comprehensive MRSA colonization     surveillance program. It is not     intended  to diagnose MRSA     infection nor to guide or     monitor treatment for     MRSA infections.   Studies/Results: Dg Chest 2 View  12/05/2012  *RADIOLOGY REPORT*  Clinical Data: Fall with shortness of breath and left-sided chest/rib pain.  CHEST - 2 VIEW  Comparison: 12/03/2012  Findings: Lung volumes remain very low with bibasilar atelectasis present as well as potential small bilateral pleural effusions.  No pneumothorax, mediastinal widening or fractures are identified.  IMPRESSION: Low lung volumes with bibasilar atelectasis and potential small bilateral pleural effusions.   Original Report Authenticated By: Irish Lack, M.D.    Medications:  Medications reviewed  Scheduled Meds: . antiseptic oral rinse  15 mL Mouth Rinse q12n4p  . benztropine  1 mg Oral BID  . carbidopa-levodopa  0.5 tablet Oral TID  . chlorhexidine  15 mL Mouth Rinse BID  . divalproex  250 mg Oral Daily  . levothyroxine  75 mcg Oral QAC breakfast  . lithium carbonate  900 mg Oral QHS  . mometasone-formoterol  2 puff Inhalation BID  . OLANZapine  20 mg Oral QHS  . OXcarbazepine  300 mg Oral BID  . senna-docusate  2 tablet Oral BID  . simvastatin  10 mg Oral q1800  . tiotropium  18 mcg Inhalation Daily  . traZODone  100 mg Oral QHS  . vitamin B-12  1,000 mcg Oral Daily  . warfarin  0.5 mg Oral ONCE-1800  . Warfarin - Pharmacist Dosing Inpatient   Does not apply q1800   Continuous Infusions:   PRN Meds:.acetaminophen, albuterol, alum & mag hydroxide-simeth, guaiFENesin, HYDROcodone-acetaminophen  Assessment/Plan:  Mr. Mentzer is a 62yo M resident of [facility] with h/o PE (on warfarin therapy), COPD, drug-induced parkinsonism, bipolar disorder, and frequent falls, found to have AMS, a supratherapeutic INR, pulmonary congestion on CXR, and multiple physical sequelae of recent falls on exam (R cheek hematoma, R thigh and knee swelling, L knee abrasion).   AKI: resolved  Reported baseline is 1.23. BUN 23 and  Cr 2.57 on admission. Labs and exam consistent with dehydration. Decreased cr clearance may be contributing to patient's altered mental status by increasing the serum concentration of psych medications. 2/20: Cr 1.39 today, BUN 19. Foley placed overnight due to urinary retention 2/21: Cr 1.09 today  - cont IVF, Strict I/Os  - Hold losartan, DC lasix -daily BMP -consider dc foley tomorrow  Acute worsening of mental status Patient has Parkinson-type neurodegenerative disorder and has baseline mental deficits, however, no family is present to assist with defining patient's baseline. He is currently living in a facility.  On admission, he had increased somnolence and disorientation. Delayed responses likely due Parkinson's. No obvious infectious cause of altered mental status, though patient not able to give accurate history and has an elevated white count.. No evidence of PNA on CXR but it was a poor quality. Urine sediment not consistent with UTI. No neck stiffness to suggest meningitis. Acute renal failure may be a cause of worsening AMS as well.  Considered acute stroke, however, ischemic stroke less likely as patient's INR is 5 on admission and head CT negative for acute hemorrhagic stroke. Lithium level wnl Patient is on many home medications including narcotics and psychiatric medications that may be contributing to altered mental status combined with increased serum concentrations from AKI. Polypharmacy is a major concern for this patient.  -treat AKI as above -will ask neurology and psych to make recommendations for medications we can discontinue -review med list. 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 No clear records 2/20: INR 4 today 2/21: INR 2.5, will cont Coumadin -coumadin dosing per pharmacy   Recent falls  No fractures noted on plain films, CT spine, face, or CT  head. Patient does have hematoma and ecchymosis on face and abrasion on lower extremity. Falls may be from worsening parkinsonism vs medication effects  -cont benztropine, levodopa  -PT/OT consult  COPD  No evidence of acute exacerbation, no wheezing on exam. He does have NP cough. Will monitor. -Continue home dulera, Spiriva, PRN albuterol  Neuroleptic-induced Parkinson's  Likely contributing to his falls and delayed responses. -will increase Sinemet and dc Cogentin, appreciate neuro recs  Bipolar disorder  Psych consult to make appropriate medication recommendations. Patient on multiple medications without clear indications.  Taper trileptal and dc trazodone.  DVT PPX -on coumadin  Dispo  -anticipate dc in 2-4 days  -will need assistance with placement    LOS: 2 days   [place] Ar 12/05/2012, 1:57 PM

## 2012-12-05 NOTE — Progress Notes (Signed)
Medical Student Daily Progress Note  Subjective: Pt states that he is feeling better - back pain improved.  However, SOB still continues to bother him.  UOP ok, appetite ok.  Agrees with close look at medications to decide about possibility of over-medication.  Objective: Vital signs in last 24 hours: Filed Vitals:   12/04/12 1351 12/04/12 2234 12/05/12 0410 12/05/12 0757  BP: 125/81 131/79 118/64   Pulse: 102 94 96   Temp: 97.1 F (36.2 C) 99 F (37.2 C) 97.8 F (36.6 C)   TempSrc: Oral Oral    Resp: 20 20 20    Height:      Weight:      SpO2: 95% 96% 97% 97%   Weight change:   Intake/Output Summary (Last 24 hours) at 12/05/12 1146 Last data filed at 12/05/12 0851  Gross per 24 hour  Intake   2475 ml  Output   1600 ml  Net    875 ml   Physical Exam: BP 125/73  Pulse 97  Temp(Src) 99.2 F (37.3 C) (Oral)  Resp 19  Ht 5\' 8"  (1.727 m)  Wt 104.9 kg (231 lb 4.2 oz)  BMI 35.17 kg/m2  SpO2 97% General appearance: alert, cooperative, no distress and attempting to drink a cup of coffee Lungs: CTA in anterior lung fields, no wheezing or crackles appreciated Heart: regular rate and rhythm, S1, S2 normal, no murmur, click, rub or gallop Abdomen: soft, non-tender; bowel sounds normal; no masses,  no organomegaly Extremities: 2+ BL pitting edema Neuro: Reaction time and alertedness improved from yesterday; BL resting tremors in upper extremities  Lab Results: Results for orders placed during the hospital encounter of 12/03/12 (from the past 24 hour(s))  PROTIME-INR     Status: Abnormal   Collection Time    12/05/12  6:22 AM      Result Value Range   Prothrombin Time 25.9 (*) 11.6 - 15.2 seconds   INR 2.51 (*) 0.00 - 1.49  TSH     Status: None   Collection Time    12/05/12  6:22 AM      Result Value Range   TSH 1.287  0.350 - 4.500 uIU/mL  BASIC METABOLIC PANEL     Status: Abnormal   Collection Time    12/05/12  6:22 AM      Result Value Range   Sodium 139  135 - 145  mEq/L   Potassium 4.2  3.5 - 5.1 mEq/L   Chloride 107  96 - 112 mEq/L   CO2 25  19 - 32 mEq/L   Glucose, Bld 125 (*) 70 - 99 mg/dL   BUN 12  6 - 23 mg/dL   Creatinine, Ser 0.86  0.50 - 1.35 mg/dL   Calcium 9.0  8.4 - 57.8 mg/dL   GFR calc non Af Amer 71 (*) >90 mL/min   GFR calc Af Amer 83 (*) >90 mL/min  LIPID PANEL     Status: Abnormal   Collection Time    12/05/12  6:22 AM      Result Value Range   Cholesterol 159  0 - 200 mg/dL   Triglycerides 469 (*) <150 mg/dL   HDL 38 (*) >62 mg/dL   Total CHOL/HDL Ratio 4.2     VLDL 40  0 - 40 mg/dL   LDL Cholesterol 81  0 - 99 mg/dL   Micro Results: Recent Results (from the past 240 hour(s))  MRSA PCR SCREENING     Status: None   Collection  Time    12/03/12  5:10 PM      Result Value Range Status   MRSA by PCR NEGATIVE  NEGATIVE Final   Comment:            The GeneXpert MRSA Assay (FDA     approved for NASAL specimens     only), is one component of a     comprehensive MRSA colonization     surveillance program. It is not     intended to diagnose MRSA     infection nor to guide or     monitor treatment for     MRSA infections.   Studies/Results: Dg Chest 2 View  12/05/2012  *RADIOLOGY REPORT*  Clinical Data: Fall with shortness of breath and left-sided chest/rib pain.  CHEST - 2 VIEW  Comparison: 12/03/2012  Findings: Lung volumes remain very low with bibasilar atelectasis present as well as potential small bilateral pleural effusions.  No pneumothorax, mediastinal widening or fractures are identified.  IMPRESSION: Low lung volumes with bibasilar atelectasis and potential small bilateral pleural effusions.   Original Report Authenticated By: Irish Lack, M.D.    Medications: I have reviewed the patient's current medications. Scheduled Meds: . antiseptic oral rinse  15 mL Mouth Rinse q12n4p  . benztropine  1 mg Oral BID  . carbidopa-levodopa  0.5 tablet Oral TID  . chlorhexidine  15 mL Mouth Rinse BID  . divalproex  250 mg  Oral Daily  . levothyroxine  75 mcg Oral QAC breakfast  . lithium carbonate  900 mg Oral QHS  . mometasone-formoterol  2 puff Inhalation BID  . OLANZapine  20 mg Oral QHS  . OXcarbazepine  300 mg Oral BID  . polyethylene glycol  17 g Oral Once  . senna-docusate  2 tablet Oral BID  . simvastatin  10 mg Oral q1800  . tiotropium  18 mcg Inhalation Daily  . traZODone  100 mg Oral QHS  . vitamin B-12  1,000 mcg Oral Daily   Continuous Infusions:  PRN Meds:.acetaminophen, albuterol, alum & mag hydroxide-simeth, guaiFENesin, HYDROcodone-acetaminophen  Assessment/Plan: Mr. [place] is a 62yo M resident of [place] with h/o PE (on warfarin therapy), COPD, drug-induced parkinsonism, bipolar disorder, and frequent falls, found to have AKI, AMS, a supratherapeutic INR, pulmonary congestion on CXR, and multiple physical sequelae of recent falls on exam (R cheek hematoma, R thigh and knee swelling, L knee abrasion).   1. AKI: Cr of 1.09 today.  Improved with rehydration, so will discontinue today with good PO intake. - D/c IVF - Avoid nephrotoxic agents  2. Acute worsening of mental status: Pt is improved clinically with increased attention and decreased reaction time during interactions. Improvement is most likely due to rehydration combined with improved clearance of medications causing therapeutic v. supratherapeutic levels. - Address polypharmacy as described in the below problems. - D/c IVF rehydration   3. Drug-induced parkinsonism: Obtained neuro c/s to optimize Parkinsonism meds and will follow recommendations to d/c Benztropine as it can increase confusion in acute setting.  Will increase Sinemet to 1mg  TID.  Will continue to monitor sx, but will need to be followed by an outpatient neurologist - D/c benztropine - Increase sinemet to 1 tablet 25-100 TID - Will require ongoing OT - F/u PT evaluation  4. Supratherapeutic INR s/p warfarin therapy for PE: INR today is 2.51. Still need to  find documentation or evidence of PE recurrence to justify chronic anticoagulation.  - Restart pharmacy dosing of warfarin (receiving 0.5mg  today) -  Continue to follow INR  - Pharmacy c/s, appreciate recommendations  5. Bipolar d/o: We have asked for psychiatry's assistance in determining an appropriate regimen.  However, it is clear that he is on too many medications for this dx.  Will initiate wean schedule for oxcarbazepine per discussion with inpatient pharmacy, as it has not been shown to be effective in setting of bipolar d/o and has a lengthy AE profile.  The usual suggestion for therapy includes Valproate or Lithium plus combination therapy with olanzapine or an equivalent.  We will continue him on current doses of Valproate, Lithium and Olanzapine until we hear psychiatry recs and come to a consensus on the appropriate combination. - Continue Valproate, lithium and olanzapine - Initiate wean schedule for Trileptal of 2 weeks (150 BID x 1 week, 150 daily x 1 week)  6. HTN: BP running in 110-120s/60-80s. Will continue to hold BP meds for now.  Will consider losartan in future with AKI improvement and unclear potential hx of gout.  Metoprolol would be next choice, as he has running slightly tachy HR while in the hospital.   Amlodipine considered to not be an appropriate medication for the pt at this time, as BP control can be maintained with above meds (triple therapy not warranted) and amlodipine could be involved in peripheral edema. - D/c amlodipine  - Hold losartan  - Hold metoprolol   7. COPD: Continue home regimen.  - Continue Spiriva  - Continue Advair   8. HC: D/c fenofibrate as there is no shown CV mortality benefit and no evidence of hyperTG.  He may have been put on this in the past due to concerns about concurrent olanzapine.  However, statin therapy should cover for this and primary CV prevention. - D/c fenofibrate  - Continue pravastatin   9. Hypothyroidism: Continue home  regimen.  - Continue levothyroxine  10. Sleep disturbance: Will d/c trazodone as it can contribute to hypersomnolence.  -D/c trazodone  11. Back spasms: Will continue therapy with valium prn.  Has requested 1/day while in hospital.  12. FEN/GI:  - Full liquid diet   13. Dispo: Floor status.  F/u PT evaluation.  Will need f/u with PCP and neurology.   LOS: 2 days   This is a Psychologist, occupational Note.  The care of the patient was discussed with Dr. Collier Bullock and the assessment and plan formulated with their assistance.  Please see their attached note for official documentation of the daily encounter.  Wynona Meals Amy 12/05/2012, 11:46 AM

## 2012-12-05 NOTE — Progress Notes (Signed)
ANTICOAGULATION CONSULT NOTE - Follow Up Consult  Pharmacy Consult for Coumadin Indication: H/O PE  Patient Measurements: Height: 5\' 8"  (172.7 cm) Weight: 231 lb 4.2 oz (104.9 kg) IBW/kg (Calculated) : 68.4  Labs:  Recent Labs  12/03/12 0908 12/03/12 1120 12/04/12 0647 12/05/12 0622  HGB 12.3*  --  9.7*  --   HCT 37.8*  --  29.7*  --   PLT 347  --  301  --   APTT  --  83*  --   --   LABPROT  --  44.7* 37.0* 25.9*  INR  --  5.15* 4.06* 2.51*  CREATININE 2.57*  --  1.39* 1.09   Estimated Creatinine Clearance: 83.5 ml/min (by C-G formula based on Cr of 1.09).  Medications:  Extensive NH MAR med list, includes: Coumadin 1mg  twice a week (Tue/Thu) and 2.5mg  other days  Assessment: 61 YOM on Coumadin prior to admission for history of PE (possibly recurrent but patient is poor historian) and ordered to continue per pharmacy dosing. Coumadin held x 2 days due to supra-therapeutic INR. Note that patient has a history of repeat falls.   Reviewed NH MAR, no obvious interacting medications noted, but high INR may be attributable to the fact that the patient may not have been eating as much PTA.  INR today is decreased down to 2.51- back in therapeutic range. No reversal agents noted.  Hgb on 2/20 was 9.7 (decreased); platelets stable.  No overt bleeding reported,  but noted to have a hematoma on his R cheek d/t falls and swelling Left knee, abrasions on R knee.   Goal of Therapy:  INR 2-3 Monitor platelets by anticoagulation protocol: Yes   Plan:  1. Unsure if Coumadin/anticoagulation is best option for patient with recurrent fall history.  2.  INR back in therapeutic range, could restart Coumadin 0.5mg  po x1 tonight (lower dose than home regimen due to recent high INRs).  **Discussed with IM team and agree with plan. They are following up with patient's PCP for further information of Coumadin indication. 3. Will check daily PT/INR and CBC at least once a week 4. Will f/up  clinical status daily  Thanks, Link Snuffer, PharmD, BCPS Clinical Pharmacist [phone] 12/05/2012 1:14 PM

## 2012-12-05 NOTE — Evaluation (Signed)
Physical Therapy Evaluation Patient Details Name: Derek Hicks MRN: [id number] DOB: [birth year] Today's Date: 12/05/2012 Time: 4098-1191 PT Time Calculation (min): 16 min  PT Assessment / Plan / Recommendation Clinical Impression  Pt adm with SOB.  Pt with drug induced Parkinson's.  Pt with declining mobility over past month and needs skilled PT to maximize I and safety to improve quality of life and decr burden of care.    PT Assessment  Patient needs continued PT services    Follow Up Recommendations  SNF    Does the patient have the potential to tolerate intense rehabilitation      Barriers to Discharge        Equipment Recommendations  None recommended by PT    Recommendations for Other Services     Frequency Min 2X/week    Precautions / Restrictions Precautions Precautions: Fall Precaution Comments: old right rotator cuff injury and surgery   Pertinent Vitals/Pain N/A      Mobility  Bed Mobility Bed Mobility: Supine to Sit;Sitting - Scoot to [place] of Bed;Sit to Sidelying Right Supine to Sit: 1: +2 Total assist;HOB elevated Supine to Sit: Patient Percentage: 10% Sitting - Scoot to Edge of Bed: 1: +2 Total assist Sitting - Scoot to Edge of Bed: Patient Percentage: 10% Sit to Sidelying Right: 1: +2 Total assist Sit to Sidelying Right: Patient Percentage: 30% Scooting to HOB: 1: +2 Total assist Scooting to [facility]: Patient Percentage: 0% Details for Bed Mobility Assistance: assist to move legs and trunk.    Exercises     PT Diagnosis: Difficulty walking;Generalized weakness  PT Problem List: Decreased strength;Decreased activity tolerance;Decreased balance;Decreased mobility PT Treatment Interventions: DME instruction;Gait training;Patient/family education;Functional mobility training;Therapeutic activities;Therapeutic exercise;Balance training   PT Goals Acute Rehab PT Goals PT Goal Formulation: With patient Time For Goal Achievement: 12/19/12 Potential to  Achieve Goals: Fair Pt will go Supine/Side to Sit: with +2 total assist (pt = 40%) PT Goal: Supine/Side to Sit - Progress: Goal set today Pt will Sit at [facility] of Bed: with supervision;6-10 min PT Goal: Sit at Edge Of Bed - Progress: Goal set today Pt will go Sit to Supine/Side: with +2 total assist (pt = 40%) PT Goal: Sit to Supine/Side - Progress: Goal set today Pt will go Sit to Stand: with +2 total assist (and sara plus) PT Goal: Sit to Stand - Progress: Goal set today Pt will go Stand to Sit: with +2 total assist (and sara plus) PT Goal: Stand to Sit - Progress: Goal set today Pt will Transfer Bed to Chair/Chair to Bed: with +2 total assist (and lift equipment) PT Transfer Goal: Bed to Chair/Chair to Bed - Progress: Goal set today  Visit Information  Last PT Received On: 12/05/12 Assistance Needed: +2    Subjective Data  Subjective: Pt states he hasn't been on his feet for 3 weeks. Patient Stated Goal: Agreeable to work toward improved mobility   Prior Functioning  Home Living Available Help at Discharge: Skilled Nursing Facility Prior Function Comments: Per pt he has been at [facility] since June of last year due to his wife felt like she could not safely leave him at home while she worked.  He also has a son.  He reports that as of one month ago he was up walking without AD and doing all of his B/D/toileting. Dominant Hand: Right    Cognition  Cognition Overall Cognitive Status: Appears within functional limits for tasks assessed/performed Arousal/Alertness: Awake/alert Orientation Level: Appears intact for tasks assessed  Behavior During Session: [facility] for tasks performed    Extremity/Trunk Assessment Right Lower Extremity Assessment RLE ROM/Strength/Tone: Deficits RLE ROM/Strength/Tone Deficits: grossly 3/5 RLE Coordination: Deficits RLE Coordination Deficits: decr due to tremors Left Lower Extremity Assessment LLE ROM/Strength/Tone: Deficits LLE ROM/Strength/Tone Deficits:  grossly 3/5 LLE Coordination: Deficits LLE Coordination Deficits: decr due to tremors   Balance Balance Balance Assessed: Yes Static Sitting Balance Static Sitting - Balance Support: Bilateral upper extremity supported Static Sitting - Level of Assistance: 4: Min assist;5: Stand by assistance Static Sitting - Comment/# of Minutes: Sat EOB x 10 miinutes with initial min A and then supervision.  End of Session PT - End of Session Activity Tolerance: Other (comment) (SOB) Patient left: in bed;with call bell/phone within reach;with bed alarm set Nurse Communication: Mobility status;Need for lift equipment  GP     Derek Hicks 12/05/2012, 3:19 PM  [facility] PT [phone]

## 2012-12-06 DIAGNOSIS — F8081 Childhood onset fluency disorder: Secondary | ICD-10-CM

## 2012-12-06 DIAGNOSIS — F1910 Other psychoactive substance abuse, uncomplicated: Secondary | ICD-10-CM

## 2012-12-06 DIAGNOSIS — F3130 Bipolar disorder, current episode depressed, mild or moderate severity, unspecified: Secondary | ICD-10-CM

## 2012-12-06 LAB — PROTIME-INR
INR: 1.96 — ABNORMAL HIGH (ref 0.00–1.49)
Prothrombin Time: 21.6 seconds — ABNORMAL HIGH (ref 11.6–15.2)

## 2012-12-06 LAB — OCCULT BLOOD X 1 CARD TO LAB, STOOL: Fecal Occult Bld: NEGATIVE

## 2012-12-06 MED ORDER — PANTOPRAZOLE SODIUM 40 MG PO TBEC
40.0000 mg | DELAYED_RELEASE_TABLET | Freq: Every day | ORAL | Status: DC
  Administered 2012-12-06 – 2012-12-08 (×3): 40 mg via ORAL
  Filled 2012-12-06 (×3): qty 1

## 2012-12-06 MED ORDER — LITHIUM CARBONATE ER 300 MG PO TBCR
600.0000 mg | EXTENDED_RELEASE_TABLET | Freq: Every day | ORAL | Status: DC
  Administered 2012-12-06 – 2012-12-07 (×2): 600 mg via ORAL
  Filled 2012-12-06 (×3): qty 2

## 2012-12-06 MED ORDER — WARFARIN SODIUM 2 MG PO TABS
2.0000 mg | ORAL_TABLET | Freq: Once | ORAL | Status: AC
  Administered 2012-12-06: 2 mg via ORAL
  Filled 2012-12-06: qty 1

## 2012-12-06 NOTE — Progress Notes (Signed)
ANTICOAGULATION CONSULT NOTE - Follow Up Consult  Pharmacy Consult for Coumadin Indication: History of recurrent pulmonary emboli  Patient Measurements: Height: 5\' 8"  (172.7 cm) Weight: 231 lb 4.2 oz (104.9 kg) IBW/kg (Calculated) : 68.4  Labs:  Recent Labs  12/04/12 0647 12/05/12 0622 12/06/12 0540  HGB 9.7*  --   --   HCT 29.7*  --   --   PLT 301  --   --   LABPROT 37.0* 25.9* 21.6*  INR 4.06* 2.51* 1.96*  CREATININE 1.39* 1.09  --    Estimated Creatinine Clearance: 83.5 ml/min (by C-G formula based on Cr of 1.09).  Medications:  Extensive NH MAR med list, includes: Coumadin 1mg  twice a week (Tue/Thu) and 2.5mg  other days  Assessment: 61 YOM on Coumadin prior to admission for history of PE and ordered to continue per pharmacy dosing. Coumadin held x 2 days due to supra-therapeutic INR. Note that patient has a history of repeat falls likely due to PKD meds.  Reviewed NH MAR, no obvious interacting medications noted, but high INR may be attributable to the fact that the patient may not have been eating as much PTA.  INR today is decreased down to 1.9-just below therapeutic range. No reversal agents noted.  Hgb on 2/20 was 9.7 (decreased); platelets stable.  No overt bleeding reported,  but noted to have a hematoma on his R cheek d/t falls and swelling Left knee, abrasions on R knee.   Goal of Therapy:  INR 2-3 Monitor platelets by anticoagulation protocol: Yes   Plan:  1.  INR back in therapeutic range, Will give 2mg  tonight. 2. Will check daily PT/INR and CBC at least once a week 3. Will f/up clinical status daily  Thanks, Sheppard Coil, PharmD, BCPS Clinical Pharmacist [phone] 12/06/2012 1:41 PM

## 2012-12-06 NOTE — Consult Note (Signed)
Patient Identification:  Derek Hicks Date of Evaluation:  12/06/2012 Reason for Consult: Bipolar DO, drug-induced Parkinsonism  Referring Provider: Dr. Collier Bullock FPTS  History of Present Illness: Pt is brought from [facility] with history of HTN, COPD, Bipolar DO, drug-induced parkinsonism, anxiety and hypothroidism.  He is brought to [facility] ED     Interval Hx: 2/22  Seen and chart was reviewed today. Reports better mood and but still confused at times per pt. Thinks sleep was fair. Feeling better that his meds less now. Reports his meds were too many before he came here.     Past Psychiatric History  Pt has used many drugs, can't say when: cannabis, LSD, PCP, mushrooms, cocaine, crack cocaine. Heroin [1 time], alcohol, 1+ 6 pack.  He went to rehab twice.  He denies history of suicidal ideation or attempt.   Past Medical History:     Past Medical History  Diagnosis Date  . COPD (chronic obstructive pulmonary disease)   . Pulmonary embolus   . Hypertension   . Bipolar 1 disorder   . Neuroleptic-induced Parkinsonism   . Anxiety   . Hyperlipidemia   . Falls infrequently   . Current use of long term anticoagulation   . Hypothyroidism   . Shortness of breath        Past Surgical History  Procedure Laterality Date  . Rotator cuff repair      x 2     Allergies:  Allergies  Allergen Reactions  . Benzodiazepines Other (See Comments)    Causes aggression, paradoxical reaction  . Other     Anti-anxiety medications    Current Medications:  Prior to Admission medications   Medication Sig Start Date End Date Taking? Authorizing Provider  acetaminophen (TYLENOL) 500 MG tablet Take 500 mg by mouth every 4 (four) hours as needed. For pain/fever over 101.   Yes Historical Provider, MD  albuterol (PROVENTIL HFA;VENTOLIN HFA) 108 (90 BASE) MCG/ACT inhaler Inhale 2 puffs into the lungs every 6 (six) hours as needed. For shortness of breath   Yes Historical Provider, MD  Alum & Mag  Hydroxide-Simeth (MI-ACID MAXIMUM STRENGTH PO) Take 30 mLs by mouth 4 (four) times daily as needed. For heartburn   Yes Historical Provider, MD  amLODipine (NORVASC) 5 MG tablet Take 5 mg by mouth daily.   Yes Historical Provider, MD  benztropine (COGENTIN) 1 MG tablet Take 1 mg by mouth 2 (two) times daily.   Yes Historical Provider, MD  carbidopa-levodopa (SINEMET IR) 25-100 MG per tablet Take 0.5 tablets by mouth 3 (three) times daily.   Yes Historical Provider, MD  divalproex (DEPAKOTE ER) 250 MG 24 hr tablet Take 250 mg by mouth daily.   Yes Historical Provider, MD  fenofibrate (TRICOR) 145 MG tablet Take 145 mg by mouth daily.   Yes Historical Provider, MD  Fluticasone-Salmeterol (ADVAIR) 250-50 MCG/DOSE AEPB Inhale 1 puff into the lungs 2 (two) times daily.   Yes Historical Provider, MD  furosemide (LASIX) 20 MG tablet Take 20 mg by mouth daily.   Yes Historical Provider, MD  guaiFENesin (ROBITUSSIN) 100 MG/5ML SOLN Take 10 mLs by mouth every 6 (six) hours as needed. For cough.   Yes Historical Provider, MD  HYDROcodone-acetaminophen (VICODIN) 5-500 MG per tablet Take 1 tablet by mouth every 6 (six) hours as needed for pain.   Yes Historical Provider, MD  lactulose (CHRONULAC) 10 GM/15ML solution Take 20 g by mouth 2 (two) times daily.   Yes Historical Provider, MD  levothyroxine (SYNTHROID, LEVOTHROID) 75 MCG tablet Take 75 mcg by mouth daily.   Yes Historical Provider, MD  lithium carbonate (LITHOBID) 300 MG CR tablet Take 900 mg by mouth at bedtime.   Yes Historical Provider, MD  loperamide (IMODIUM A-D) 2 MG tablet Take 2 mg by mouth 4 (four) times daily as needed. For diarrhea   Yes Historical Provider, MD  losartan (COZAAR) 50 MG tablet Take 50 mg by mouth daily.   Yes Historical Provider, MD  magnesium hydroxide (MILK OF MAGNESIA) 400 MG/5ML suspension Take 30 mLs by mouth daily as needed. For heartburn   Yes Historical Provider, MD  metoprolol (LOPRESSOR) 100 MG tablet Take 100 mg by  mouth 2 (two) times daily.   Yes Historical Provider, MD  OLANZapine (ZYPREXA) 20 MG tablet Take 20 mg by mouth at bedtime.   Yes Historical Provider, MD  Oxcarbazepine (TRILEPTAL) 300 MG tablet Take 300 mg by mouth 2 (two) times daily.   Yes Historical Provider, MD  potassium chloride (K-DUR,KLOR-CON) 10 MEQ tablet Take 10 mEq by mouth daily.   Yes Historical Provider, MD  pravastatin (PRAVACHOL) 20 MG tablet Take 20 mg by mouth daily.   Yes Historical Provider, MD  tiotropium (SPIRIVA) 18 MCG inhalation capsule Place 18 mcg into inhaler and inhale daily.   Yes Historical Provider, MD  traZODone (DESYREL) 100 MG tablet Take 100 mg by mouth at bedtime.   Yes Historical Provider, MD  vitamin B-12 (CYANOCOBALAMIN) 1000 MCG tablet Take 1,000 mcg by mouth daily.   Yes Historical Provider, MD  warfarin (COUMADIN) 1 MG tablet Take 1 mg by mouth 2 (two) times a week. Tuesday and Thursday   Yes Historical Provider, MD  warfarin (COUMADIN) 2.5 MG tablet Take 2.5 mg by mouth daily. All days except for Tuesday and Thursday   Yes Historical Provider, MD  furosemide (LASIX) 20 MG tablet Take 1 tablet (20 mg total) by mouth daily. 11/05/11 11/04/12  Simbiso Ranga, MD  potassium chloride (K-DUR,KLOR-CON) 10 MEQ tablet Take 1 tablet (10 mEq total) by mouth daily. 11/05/11 11/04/12  Simbiso Ranga, MD  tiotropium (SPIRIVA) 18 MCG inhalation capsule Place 1 capsule (18 mcg total) into inhaler and inhale daily. 11/05/11 11/04/12  Simbiso Ranga, MD    Social History:    reports that he quit smoking about 10 years ago. He has never used smokeless tobacco. He reports that he does not drink alcohol or use illicit drugs.   Family History:    Family History  Problem Relation Age of Onset  . Aneurysm Mother   . Other      No family history of DVT or PE    Mental Status Examination/Evaluation: Objective:  Appearance: obese and unshaven  Eye Contact::  Good  Speech:  stutters  Volume:  Normal  Mood:  better  Affect:   Congruent  Thought Process:  Disorganized, Loose and easily confused, poor historian   Orientation:  Other:  Pt is oriented to person place, marriage, little else  Thought Content:  problems organizing thoughts  Suicidal Thoughts:  No  Homicidal Thoughts:  No  Judgement:  Impaired  Insight:  Lacking   DIAGNOSIS:   AXIS I  h/o Bipolar 1, depressed, Polypharmacy Abuse, remote; Drug-induced Parkinsonism, Stuttering  AXIS II  Deferred  AXIS III See medical notes.  AXIS IV other psychosocial or environmental problems, problems related to social environment and Pt has shffling gate, frequent falls and complicated medical conditions.  AXIS V 41-50 serious symptoms  RECOMMENDATION:   1.  Will continue to follow recommendations by Dr. Ferol Luz (note dated 2/21) 2.  Will follow pt. As needed  12/06/2012 7:07 PM      ROS  Physical Exam

## 2012-12-06 NOTE — Clinical Social Work Psychosocial (Signed)
Clinical Social Work Department BRIEF PSYCHOSOCIAL ASSESSMENT 12/06/2012  Patient:  [name],[name]     Account Number:  [id number]     Admit date:  12/03/2012  Clinical Social Worker:  Oswaldo Done  Date/Time:  12/06/2012 12:54 PM  Referred by:  Physician  Date Referred:  12/05/2012 Referred for  SNF Placement   Other Referral:   Interview type:   Other interview type:    PSYCHOSOCIAL DATA Living Status:  FACILITY Admitted from facility:   Level of care:  Assisted Living Primary support name:  Derek Hicks Primary support relationship to patient:  SPOUSE Degree of support available:   Adequate    CURRENT CONCERNS Current Concerns  Post-Acute Placement   Other Concerns:    SOCIAL WORK ASSESSMENT / PLAN CSW talked with patient at bedside. States he wishes to return to [facility] at discharge. Asks me to contact his wife to confirm. Called wife and she understand patient need for SNF placement for rehabilitation. CSW and wife discussed sending out patient information to [facility]. Would like patient to return to [facility] once SNF rehabilitation is complete. No other needs or oncerns at this time.   Assessment/plan status:  Information/Referral to [facility] Other assessment/ plan:   Information/referral to community resources:   SNF List  Ambulance transport    PATIENT'S/FAMILY'S RESPONSE TO PLAN OF CARE: Patient and patient wife thanked CSW for assistance in finding appropriate SNF for discharge.        Derek Hicks, [place] [phone] (weekend)

## 2012-12-06 NOTE — Progress Notes (Signed)
Subjective: No acute events overnight.  Patient continues to note mild non-productive cough, present for >1 week, but no SOB.  Patient notes no change in his mood after adjusting his psychiatric medications yesterday.  He has been working with PT, and participating well.  Objective: Vital signs in last 24 hours: Filed Vitals:   12/05/12 2022 12/05/12 2056 12/06/12 0508 12/06/12 0843  BP: 152/87  129/81   Pulse: 103  92   Temp: 100.1 F (37.8 C)  99.3 F (37.4 C)   TempSrc: Oral  Oral   Resp: 19  18   Height:      Weight:      SpO2: 95% 97% 95% 96%   Weight change:   Intake/Output Summary (Last 24 hours) at 12/06/12 0918 Last data filed at 12/06/12 0700  Gross per 24 hour  Intake 573.75 ml  Output   2200 ml  Net -1626.25 ml    Physical Exam Blood pressure 129/81, pulse 92, temperature 99.3 F (37.4 C), temperature source Oral, resp. rate 18, height 5\' 8"  (1.727 m), weight 231 lb 4.2 oz (104.9 kg), SpO2 96.00%. General: No acute distress, resting in bed, oriented to person and place, alert and interactive HEENT: PERRL, EOMI, right cheek hematoma, ecchymoses around the right eye  Cardiovascular: Regular rate and rhythm, no murmurs, rubs or gallops  Respiratory: Clear to auscultation bilaterally, no wheezes, rales, or rhonchi  Abdomen: Soft, slightly distended, reported epigastric tenderness, bowel sounds present, no rebound or guarding  Extremities: Warm and well-perfused, 2+ edema, left shin abrasion  Skin: Warm, dry, no rashes, good pulses  Neuro: More alert than admission, stuttered speech, resting tremor noted in bilateral upper extremities (mildly improved since admission). Moving all 4 extremities spontaneously. 4/5 grip strength but will not raise arms on command. Unable to lift legs off the bed. Sensation to light touch intact throughout.   Lab Results: Basic Metabolic Panel:  Recent Labs Lab 12/04/12 0647 12/05/12 0622  NA 139 139  K 4.0 4.2  CL 108 107  CO2  25 25  GLUCOSE 113* 125*  BUN 19 12  CREATININE 1.39* 1.09  CALCIUM 8.4 9.0   Liver Function Tests:  Recent Labs Lab 12/03/12 0908 12/04/12 0647  AST 69* 33  ALT 14 5  ALKPHOS 43 39  BILITOT 0.6 0.5  PROT 6.9 5.4*  ALBUMIN 3.3* 2.5*   CBC:  Recent Labs Lab 12/03/12 0908 12/04/12 0647  WBC 15.5* 10.7*  HGB 12.3* 9.7*  HCT 37.8* 29.7*  MCV 90.4 91.1  PLT 347 301   BNP:  Recent Labs Lab 12/03/12 0908  PROBNP 201.0*   Coagulation:  Recent Labs Lab 12/03/12 1120 12/04/12 0647 12/05/12 0622 12/06/12 0540  LABPROT 44.7* 37.0* 25.9* 21.6*  INR 5.15* 4.06* 2.51* 1.96*   Urine Drug Screen: Drugs of Abuse     Component Value Date/Time   LABOPIA NONE DETECTED 07/15/2012 0503   COCAINSCRNUR NONE DETECTED 07/15/2012 0503   LABBENZ NONE DETECTED 07/15/2012 0503   AMPHETMU NONE DETECTED 07/15/2012 0503   THCU NONE DETECTED 07/15/2012 0503   LABBARB NONE DETECTED 07/15/2012 0503    Urinalysis:  Recent Labs Lab 12/03/12 1827  COLORURINE AMBER*  LABSPEC 1.024  PHURINE 5.5  GLUCOSEU NEGATIVE  HGBUR TRACE*  BILIRUBINUR SMALL*  KETONESUR 15*  PROTEINUR 30*  UROBILINOGEN 1.0  NITRITE NEGATIVE  LEUKOCYTESUR TRACE*     Micro Results: Recent Results (from the past 240 hour(s))  MRSA PCR SCREENING     Status: None  Collection Time    12/03/12  5:10 PM      Result Value Range Status   MRSA by PCR NEGATIVE  NEGATIVE Final   Comment:            The GeneXpert MRSA Assay (FDA     approved for NASAL specimens     only), is one component of a     comprehensive MRSA colonization     surveillance program. It is not     intended to diagnose MRSA     infection nor to guide or     monitor treatment for     MRSA infections.   Studies/Results: Dg Chest 2 View  12/05/2012  *RADIOLOGY REPORT*  Clinical Data: Fall with shortness of breath and left-sided chest/rib pain.  CHEST - 2 VIEW  Comparison: 12/03/2012  Findings: Lung volumes remain very low with bibasilar  atelectasis present as well as potential small bilateral pleural effusions.  No pneumothorax, mediastinal widening or fractures are identified.  IMPRESSION: Low lung volumes with bibasilar atelectasis and potential small bilateral pleural effusions.   Original Report Authenticated By: Irish Lack, M.D.    Medications:  Medications reviewed  Scheduled Meds: . antiseptic oral rinse  15 mL Mouth Rinse q12n4p  . carbidopa-levodopa  1 tablet Oral TID  . chlorhexidine  15 mL Mouth Rinse BID  . divalproex  125 mg Oral Daily  . levothyroxine  75 mcg Oral QAC breakfast  . lithium carbonate  600 mg Oral QHS  . mometasone-formoterol  2 puff Inhalation BID  . OLANZapine  10 mg Oral QHS  . OXcarbazepine  300 mg Oral BID  . senna-docusate  2 tablet Oral BID  . simvastatin  10 mg Oral q1800  . tiotropium  18 mcg Inhalation Daily  . vitamin B-12  1,000 mcg Oral Daily  . Warfarin - Pharmacist Dosing Inpatient   Does not apply q1800   Continuous Infusions:   PRN Meds:.acetaminophen, albuterol, alum & mag hydroxide-simeth, guaiFENesin, HYDROcodone-acetaminophen  Assessment/Plan:  Mr. 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   AKI: resolved  Reported baseline is 1.23. BUN 23 and Cr 2.57 on admission. Labs and exam consistent with dehydration. Decreased cr clearance may be contributing to patient's altered mental status by increasing the serum concentration of psych medications. 2/20: Cr 1.39 today, BUN 19. 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 He is currently living in a facility, presumably due to dementia (patient reports diagnosis of alzheimer's, but actual diagnosis is unclear). On admission, he had increased somnolence and disorientation, which has now resolved. Acute renal failure may have contributed to presenting AMS as well.  Lithium level wnl, though lithium toxicity can present similarly.  Overall, polypharmacy is likely the greatest contributor to the patient's presenting symptoms -consulted neurology and psychiatry, who have been indispensable in helping [place] parse down the patient's medication list -taper depakote - 125 mg/day from 2/22-2/25, then discontinue -taper lithium - 600 mg/day 2/22-2/24, then 300 mg 2/25-2/28, then discontinue -taper olanzapine - 10 mg/day 2/22-2/25, then 5 mg  2/26-3/1, then 2.5 mg 3/2-3/5, then stop -CONTINUE trileptal -plan to decrease cogentin in a few weeks after discontinuing above medications.  Will set patient up with outpatient neurology to follow-up after discharge.  Polypharmacy  Likely contributing to presenting AMS.  To clarify, below is a summary of all of the medication changes we are making: -taper depakote - 125 mg/day from 2/22-2/25, then discontinue -taper lithium - 600 mg/day 2/22-2/24, then 300 mg 2/25-2/28, then discontinue -taper olanzapine - 10 mg/day 2/22-2/25, then 5 mg 2/26-3/1, then 2.5 mg 3/2-3/5, then stop -CONTINUE trileptal -discontinued trazodone (patient was using for sleep but can be sedating), fenofibrate (patient already on statin, and TG normal here, can f/u as outpatient), lasix (likely contributed to AKI/volume depletion), k-dur (presumably only prescribed because patient was on lasix), metoprolol (patient currently normotensive, no history of CAD), losartan (patient currently normotensive, no history of CAD, proteinuria, though questionable history of gout), alum & mag hydroxide/lactulose/imodium/milk of magnesia  (conflicting regimen that both causes and treats diarrhea and constipation) -start protonix for heartburn (since discontinuing alum/mag hydroxide and milk of magnesia)  Elevated INR - Resolved Patient presents with INR >5, 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 No clear records 2/20: INR 4 today 2/21: INR 2.5, will cont Coumadin 2/22: INR 1.96 -coumadin dosing per pharmacy   Recent falls  No fractures noted on plain films, CT spine, face, or CT head. Patient does have hematoma and ecchymosis on face and abrasion on lower extremity. Falls may be from worsening parkinsonism vs medication effects  -cont benztropine, levodopa  -PT/OT consult  COPD  No evidence of acute exacerbation, no wheezing on exam. He does have NP cough. Will monitor. -Continue home dulera, Spiriva, PRN albuterol  Bipolar disorder  Psych consult to make appropriate medication recommendations. Patient on multiple medications without clear indications.  -see "AMS/Parkinsonism" above for medication changes  DVT PPX -on coumadin  Dispo  -anticipate dc in 1-2 days back to [facility]    LOS: 3 days   Janalyn Harder 12/06/2012, 9:18 AM

## 2012-12-06 NOTE — Clinical Social Work Placement (Addendum)
Clinical Social Work Department CLINICAL SOCIAL WORK PLACEMENT NOTE 12/06/2012  Patient:  IONGE,XBMWU A  Account Number:  [id number] Admit date:  12/03/2012  Clinical Social Worker:  Oswaldo Done  Date/time:  12/06/2012 02:00 PM  Clinical Social Work is seeking post-discharge placement for this patient at the following level of care:   SKILLED NURSING   (*CSW will update this form in Epic as items are completed)   12/06/2012  Patient/family provided with [facility] Department of Clinical Social Work's list of facilities offering this level of care within the geographic area requested by the patient (or if unable, by the patient's family).  12/06/2012  Patient/family informed of their freedom to choose among providers that offer the needed level of care, that participate in Medicare, Medicaid or managed care program needed by the patient, have an available bed and are willing to accept the patient.  12/06/2012  Patient/family informed of [facility]' ownership interest in [facility], as well as of the fact that they are under no obligation to receive care at this facility.  PASARR submitted to EDS on 12/08/2012 PASARR number received from EDS on 12/08/2012  FL2 transmitted to all facilities in geographic area requested by pt/family on  12/06/2012 FL2 transmitted to all facilities within larger geographic area on N/A  Patient informed that his/her managed care company has contracts with or will negotiate with  certain facilities, including the following:     Patient/family informed of bed offers received: 12/08/2012  Patient chooses bed at [facility]: [facility] Physician recommends and patient chooses bed at  N/A  Patient to be transferred to 12/08/2012 Patient to be transferred to facility by [facility]  The following physician request were entered in Epic:   Additional Comments:  [name], Theresia Majors [phone] (weekend)

## 2012-12-07 ENCOUNTER — Inpatient Hospital Stay (HOSPITAL_COMMUNITY): Payer: Medicare Other

## 2012-12-07 LAB — BASIC METABOLIC PANEL
Calcium: 9.6 mg/dL (ref 8.4–10.5)
Creatinine, Ser: 1 mg/dL (ref 0.50–1.35)
GFR calc non Af Amer: 79 mL/min — ABNORMAL LOW (ref >90)
Sodium: 137 mEq/L (ref 135–145)

## 2012-12-07 LAB — PROTIME-INR: INR: 1.84 — ABNORMAL HIGH (ref 0.00–1.49)

## 2012-12-07 LAB — GLUCOSE, CAPILLARY: Glucose-Capillary: 122 mg/dL — ABNORMAL HIGH (ref 70–99)

## 2012-12-07 MED ORDER — ENOXAPARIN SODIUM 60 MG/0.6ML ~~LOC~~ SOLN
0.5000 mg/kg | SUBCUTANEOUS | Status: DC
  Administered 2012-12-07 – 2012-12-08 (×2): 50 mg via SUBCUTANEOUS
  Filled 2012-12-07 (×2): qty 0.6

## 2012-12-07 MED ORDER — BENZTROPINE MESYLATE 1 MG PO TABS
1.0000 mg | ORAL_TABLET | Freq: Two times a day (BID) | ORAL | Status: DC
  Administered 2012-12-07 – 2012-12-08 (×3): 1 mg via ORAL
  Filled 2012-12-07 (×4): qty 1

## 2012-12-07 MED ORDER — WARFARIN SODIUM 2.5 MG PO TABS
2.5000 mg | ORAL_TABLET | Freq: Once | ORAL | Status: AC
  Administered 2012-12-07: 2.5 mg via ORAL
  Filled 2012-12-07: qty 1

## 2012-12-07 MED ORDER — ENOXAPARIN SODIUM 40 MG/0.4ML ~~LOC~~ SOLN: 40.0000 mg | SUBCUTANEOUS | Status: DC

## 2012-12-07 NOTE — Progress Notes (Signed)
Subjective: Pt reports continued improvement in SOB and breathing ease. Appetite good, and thinks UOP is ok after foley d/c. Patient continues to note no change in mood or thoughts of harming himself or others since adjustment/tapering of psych meds. Agrees with plan to potential d/c tomorrow with continued improvement and no acute change to conditions with medication changes.  Objective: Vital signs in last 24 hours: Filed Vitals:   12/06/12 0843 12/06/12 1339 12/06/12 2135 12/07/12 0556  BP:  131/80 126/84 150/85  Pulse:  98 90 96  Temp:  98.1 F (36.7 C) 97.6 F (36.4 C) 97.9 F (36.6 C)  TempSrc:  Oral Oral Oral  Resp:  20 20 20   Height:      Weight:      SpO2: 96% 96% 97% 99%   Weight change:   Intake/Output Summary (Last 24 hours) at 12/07/12 0912 Last data filed at 12/07/12 0846  Gross per 24 hour  Intake    642 ml  Output    400 ml  Net    242 ml    Physical Exam Blood pressure 150/85, pulse 96, temperature 97.9 F (36.6 C), temperature source Oral, resp. rate 20, height 5\' 8"  (1.727 m), weight 231 lb 4.2 oz (104.9 kg), SpO2 99.00%. General: No acute distress, resting in bed, oriented to person and place, alert and interactive HEENT: PERRL, EOMI, right cheek hematoma, ecchymoses around the right eye  Cardiovascular: Regular rate and rhythm, no murmurs, rubs or gallops  Respiratory: Clear to auscultation bilaterally, no wheezes, rales, or rhonchi  Abdomen: Soft, slightly distended, no tenderness, bowel sounds present, no rebound or guarding  Extremities: Warm and well-perfused, 2+ edema, left shin abrasion  Skin: Warm, dry, no rashes, good pulses  Neuro: More alert than admission, stuttered speech, resting tremor noted in bilateral upper extremities (mildly improved since admission). Moving all 4 extremities spontaneously.   Lab Results: Basic Metabolic Panel:  Recent Labs Lab 12/05/12 0622 12/07/12 0730  NA 139 137  K 4.2 4.4  CL 107 102  CO2 25 26   GLUCOSE 125* 134*  BUN 12 13  CREATININE 1.09 1.00  CALCIUM 9.0 9.6   Liver Function Tests:  Recent Labs Lab 12/03/12 0908 12/04/12 0647  AST 69* 33  ALT 14 5  ALKPHOS 43 39  BILITOT 0.6 0.5  PROT 6.9 5.4*  ALBUMIN 3.3* 2.5*   CBC:  Recent Labs Lab 12/03/12 0908 12/04/12 0647  WBC 15.5* 10.7*  HGB 12.3* 9.7*  HCT 37.8* 29.7*  MCV 90.4 91.1  PLT 347 301   BNP:  Recent Labs Lab 12/03/12 0908  PROBNP 201.0*   Coagulation:  Recent Labs Lab 12/04/12 0647 12/05/12 0622 12/06/12 0540 12/07/12 0532  LABPROT 37.0* 25.9* 21.6* 20.6*  INR 4.06* 2.51* 1.96* 1.84*   Urine Drug Screen: Drugs of Abuse     Component Value Date/Time   LABOPIA NONE DETECTED 07/15/2012 0503   COCAINSCRNUR NONE DETECTED 07/15/2012 0503   LABBENZ NONE DETECTED 07/15/2012 0503   AMPHETMU NONE DETECTED 07/15/2012 0503   THCU NONE DETECTED 07/15/2012 0503   LABBARB NONE DETECTED 07/15/2012 0503    Urinalysis:  Recent Labs Lab 12/03/12 1827  COLORURINE AMBER*  LABSPEC 1.024  PHURINE 5.5  GLUCOSEU NEGATIVE  HGBUR TRACE*  BILIRUBINUR SMALL*  KETONESUR 15*  PROTEINUR 30*  UROBILINOGEN 1.0  NITRITE NEGATIVE  LEUKOCYTESUR TRACE*     Micro Results: Recent Results (from the past 240 hour(s))  MRSA PCR SCREENING     Status: None  Collection Time    12/03/12  5:10 PM      Result Value Range Status   MRSA by PCR NEGATIVE  NEGATIVE Final   Comment:            The GeneXpert MRSA Assay (FDA     approved for NASAL specimens     only), is one component of a     comprehensive MRSA colonization     surveillance program. It is not     intended to diagnose MRSA     infection nor to guide or     monitor treatment for     MRSA infections.   Studies/Results: No results found. Medications:  Medications reviewed  Scheduled Meds: . antiseptic oral rinse  15 mL Mouth Rinse q12n4p  . benztropine  1 mg Oral BID  . carbidopa-levodopa  1 tablet Oral TID  . chlorhexidine  15 mL Mouth  Rinse BID  . divalproex  125 mg Oral Daily  . levothyroxine  75 mcg Oral QAC breakfast  . lithium carbonate  600 mg Oral QHS  . mometasone-formoterol  2 puff Inhalation BID  . OLANZapine  10 mg Oral QHS  . OXcarbazepine  300 mg Oral BID  . pantoprazole  40 mg Oral Daily  . senna-docusate  2 tablet Oral BID  . simvastatin  10 mg Oral q1800  . tiotropium  18 mcg Inhalation Daily  . vitamin B-12  1,000 mcg Oral Daily  . Warfarin - Pharmacist Dosing Inpatient   Does not apply q1800   Continuous Infusions:   PRN Meds:.acetaminophen, albuterol, alum & mag hydroxide-simeth, guaiFENesin, HYDROcodone-acetaminophen  Assessment/Plan:  Mr. 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   AKI: resolved  Reported baseline is 1.23. BUN 23 and Cr 2.57 on admission. Labs and exam consistent with dehydration. Decreased cr clearance may be contributing to patient's altered mental status by increasing the serum concentration of psych medications. 2/20: Cr 1.39, BUN 19. Foley placed due to urinary retention 2/23: Cr 1.0 today, BUN 13, foley dc'd yesterday  AMS/Parkinsonism and Polypharmacy Patient has Parkinson-type neurodegenerative disorder and has baseline mental deficits. He is currently living in a facility, presumably due to dementia (patient reports diagnosis of alzheimer's, but actual diagnosis is unclear). On admission, he had increased somnolence and disorientation, which has now resolved. Acute renal failure may have contributed to presenting AMS as well.  Lithium level wnl, though lithium toxicity can present similarly.  Overall, polypharmacy is likely the greatest contributor to the patient's presenting  symptoms  -consulted neurology and psychiatry, who have been indispensable in helping [place] parse down the patient's medication list -taper depakote - 125 mg/day from 2/22-2/25, then discontinue -taper lithium - 600 mg/day 2/22-2/24, then 300 mg 2/25-2/28, then discontinue -taper olanzapine - 10 mg/day 2/22-2/25, then 5 mg 2/26-3/1, then 2.5 mg 3/2-3/5, then stop -CONTINUE trileptal -plan to decrease cogentin in a few weeks after discontinuing above medications.  Will set patient up with outpatient neurology to follow-up after discharge. -discontinued trazodone (patient was using for sleep but can be sedating), fenofibrate (patient already on statin, and TG normal here, can f/u as outpatient), lasix (likely contributed to AKI/volume depletion), k-dur (presumably only prescribed because patient was on lasix), metoprolol (  patient currently normotensive, no history of CAD), losartan (patient currently normotensive, no history of CAD, proteinuria, though questionable history of gout), alum & mag hydroxide/lactulose/imodium/milk of magnesia (conflicting regimen that both causes and treats diarrhea and constipation) -started protonix for heartburn  Elevated INR - Resolved Patient presented with INR >5, 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 No clear records 2/23: INR 1.84 -coumadin dosing per pharmacy   Chronic back pain/spasms -cont norco, valium PRN  Recent falls  No fractures noted on plain films, CT spine, face, or CT head. Patient does have hematoma and ecchymosis on face and abrasion on lower extremity. Falls may be from worsening parkinsonism vs medication effects  -cont benztropine, levodopa  -cont PT/OT  COPD  No evidence of acute exacerbation. He does have NP cough. Will monitor. -Continue home dulera, Spiriva, PRN albuterol  Bipolar disorder  Psych consult to make appropriate medication recommendations. Patient on multiple medications without clear  indications.  -see "AMS/Parkinsonism" above for medication changes  Hypothyroidism -cont home synthroid  Hyperlipidemia -cont simvastatin  DVT PPX -on coumadin  Dispo  -anticipate dc in 1-2 days back to [facility]    LOS: 4 days   [place] Ar 12/07/2012, 9:12 AM

## 2012-12-07 NOTE — Progress Notes (Signed)
12/07/12 Patient has episode of ST and pulse 118, cbg was 122. He had lower rate after 15 minutes of 143/70 and pulse of 89. Tele strips run and recorded placed in chart. Call placed to Dr. Collier Bullock to inform of episode explained what was done and values repeated.

## 2012-12-07 NOTE — Discharge Summary (Signed)
[facility] Discharge Note  Name: Derek Hicks MRN: [id number] DOB: 1951/09/16 62 y.o.  Date of Admission: 12/03/2012  8:30 AM Date of Discharge: 12/08/2012 Attending Physician: Rocco Serene, MD  Discharge Diagnosis: Principal Problem:   Polypharmacy Active Problems:   Pulmonary embolism   Anxiety state   COPD (chronic obstructive pulmonary disease)   Dementia in Parkinson's disease   Neuroleptic-induced Parkinsonism   Falls infrequently   Supratherapeutic INR   AKI (acute kidney injury)   Polysubstance dependence, non-opioid, in remission   Discharge Medications:   Medication List    STOP taking these medications       amLODipine 5 MG tablet  Commonly known as:  NORVASC     divalproex 250 MG 24 hr tablet  Commonly known as:  DEPAKOTE ER     fenofibrate 145 MG tablet  Commonly known as:  TRICOR     furosemide 20 MG tablet  Commonly known as:  LASIX     guaiFENesin 100 MG/5ML Soln  Commonly known as:  ROBITUSSIN     HYDROcodone-acetaminophen 5-500 MG per tablet  Commonly known as:  VICODIN     lactulose 10 GM/15ML solution  Commonly known as:  CHRONULAC     loperamide 2 MG tablet  Commonly known as:  IMODIUM A-D     losartan 50 MG tablet  Commonly known as:  COZAAR     magnesium hydroxide 400 MG/5ML suspension  Commonly known as:  MILK OF MAGNESIA     metoprolol 100 MG tablet  Commonly known as:  LOPRESSOR     MI-ACID MAXIMUM STRENGTH PO     potassium chloride 10 MEQ tablet  Commonly known as:  K-DUR,KLOR-CON     traZODone 100 MG tablet  Commonly known as:  DESYREL      TAKE these medications       acetaminophen 500 MG tablet  Commonly known as:  TYLENOL  Take 500 mg by mouth every 4 (four) hours as needed. For pain/fever over 101.     albuterol 108 (90 BASE) MCG/ACT inhaler  Commonly known as:  PROVENTIL HFA;VENTOLIN HFA  Inhale 2 puffs into the lungs every 6 (six) hours as needed. For shortness of  breath     benztropine 1 MG tablet  Commonly known as:  COGENTIN  Take 1 mg by mouth 2 (two) times daily.     carbidopa-levodopa 25-100 MG per tablet  Commonly known as:  SINEMET IR  Take 1 tablet by mouth 3 (three) times daily.     divalproex 125 MG capsule  Commonly known as:  DEPAKOTE SPRINKLE  Take one capsule on 12/09/12, then stop this medication     Fluticasone-Salmeterol 250-50 MCG/DOSE Aepb  Commonly known as:  ADVAIR  Inhale 1 puff into the lungs 2 (two) times daily.     HYDROcodone-acetaminophen 5-325 MG per tablet  Commonly known as:  NORCO/VICODIN  Take 1 tablet by mouth 2 (two) times daily as needed.     levothyroxine 75 MCG tablet  Commonly known as:  SYNTHROID, LEVOTHROID  Take 75 mcg by mouth daily.     lithium carbonate 300 MG CR tablet  Commonly known as:  LITHOBID  Take one tablet per day for three days, then stop this medication     OLANZapine 5 MG tablet  Commonly known as:  ZYPREXA  Take 2 tabs on 12/09/12, 1 tab for 4 days (2/26-3/1), then a half-tab for 4 days (3/2-3/5), then stop this medication  Oxcarbazepine 300 MG tablet  Commonly known as:  TRILEPTAL  Take 300 mg by mouth 2 (two) times daily.     pantoprazole 40 MG tablet  Commonly known as:  PROTONIX  Take 1 tablet (40 mg total) by mouth daily.     pravastatin 20 MG tablet  Commonly known as:  PRAVACHOL  Take 20 mg by mouth daily.     tiotropium 18 MCG inhalation capsule  Commonly known as:  SPIRIVA  Place 18 mcg into inhaler and inhale daily.     vitamin B-12 1000 MCG tablet  Commonly known as:  CYANOCOBALAMIN  Take 1,000 mcg by mouth daily.     warfarin 1 MG tablet  Commonly known as:  COUMADIN  Take 1 mg by mouth 2 (two) times a week. Tuesday and Thursday     warfarin 2.5 MG tablet  Commonly known as:  COUMADIN  Take 2.5 mg by mouth daily. All days except for Tuesday and Thursday        Disposition and follow-up:   Derek Hicks was discharged from [facility] in Stable condition.  At the hospital follow up visit please address the following:  -follow up with neurologist for management of sinemet and benztropine -cont titration of depakote, lithium, olanzapine -continue trileptal -cont to use caution with adding unnecessary medications. We stopped the following medications on this admission: trazodone, fenofibrate, k-dur, lasix, metoprolol, losartan, mylanta, lactulose, milk of magnesia -follow up with psychiatry as scheduled for medical management  Follow-up Appointments: Follow-up Information   Follow up with [facility]. (A referral has been sent to [facility], and they will call you to set up a follow-up appointment.)    Contact information:   [address] [place] [address] [phone]      Follow up with ARFEEN,SYED T., MD. (Your appointment is on March 4 at 10:30am.  IMPORTANT: You will be mailed a packet of information to your home address that will need to be filled out and brought to your first appointment in order to be seen.)    Contact information:   [address] [place] [address] [phone]        Consultations: Treatment Team:  Mickeal Skinner, MD Psychiatry  Neurology   Procedures Performed:  Dg Chest 2 View  12/05/2012  *RADIOLOGY REPORT*  Clinical Data: Fall with shortness of breath and left-sided chest/rib pain.  CHEST - 2 VIEW  Comparison: 12/03/2012  Findings: Lung volumes remain very low with bibasilar atelectasis present as well as potential small bilateral pleural effusions.  No pneumothorax, mediastinal widening or fractures are identified.  IMPRESSION: Low lung volumes with bibasilar atelectasis and potential small bilateral pleural effusions.   Original Report Authenticated By: Irish Lack, M.D.    Dg Chest 2 View  12/03/2012  *RADIOLOGY REPORT*  Clinical Data: Fall.  Shortness of breath.  CHEST - 2 VIEW  Comparison:  10/31/2011  Findings: Low lung volumes with bibasilar atelectasis.  Heart is mildly enlarged.  Mild vascular congestion.  Possible small bilateral effusions.  No acute bony abnormality.  Degenerative changes in the shoulders, right greater than left.  IMPRESSION: Low lung volumes with cardiomegaly, vascular congestion and bibasilar atelectasis.  Suspect small effusions.   Original Report Authenticated By: Charlett Nose, M.D.    Dg Pelvis 1-2 Views  12/03/2012  *RADIOLOGY REPORT*  Clinical Data: Fall, right hip pain.  PELVIS - 1-2 VIEW  Comparison: None  Findings: No acute bony abnormality.  Specifically, no fracture, subluxation, or dislocation.  Soft tissues are intact.  SI joints and hip joints are symmetric and unremarkable.  IMPRESSION: No acute bony abnormality.   Original Report Authenticated By: Charlett Nose, M.D.    Dg Femur Right  12/03/2012  *RADIOLOGY REPORT*  Clinical Data: Fall, right proximal femoral pain.  RIGHT FEMUR - 2 VIEW  Comparison: None.  Findings: Soft tissue swelling over the knee anteriorly. Irregularity noted that along the anterior tibial tuberosity appears well corticated and likely related to old injury.  No visible acute bony abnormality.  No joint effusion within the right knee.  IMPRESSION: Marked soft tissue swelling over the right knee anteriorly.  No acute bony abnormality.   Original Report Authenticated By: Charlett Nose, M.D.    Ct Head Wo Contrast  12/03/2012  *RADIOLOGY REPORT*  Clinical Data:  Recent fall, history of Parkinson's disease, increased spasticity with weakness  CT HEAD WITHOUT CONTRAST CT MAXILLOFACIAL WITHOUT CONTRAST CT CERVICAL SPINE WITHOUT CONTRAST  Technique:  Multidetector CT imaging of the head, cervical spine, and maxillofacial structures were performed using the standard protocol without intravenous contrast. Multiplanar CT image reconstructions of the cervical spine and maxillofacial structures were also generated.  Comparison:  CT brain scan of  08/04/2012  CT HEAD  Findings: The ventricular system remains prominent, and the cortical sulci are prominent consistent with diffuse entry.  The septum is in a normal midline position.  Moderate small vessel ischemic change is stable throughout the periventricular white matter.  No hemorrhage, mass lesion, or acute infarction is seen. There does appear to be a right facial hematoma overlying the zygomatic arch.  IMPRESSION: Atrophy and small vessel disease.  Right facial hematoma.  CT MAXILLOFACIAL  Findings:  A superficial hematoma overlies the right zygomatic arch.  However no fracture of the zygomatic arch is seen.  The orbital rims also appear intact.  The mandible is intact and mandibular condyles are in normal position.  The nasal bone appears intact. The paranasal sinuses appear intact.  IMPRESSION: No maxillofacial fracture.  Hematoma overlies the right zygomatic arch.  CT CERVICAL SPINE  Findings:   The cervical vertebrae are in normal alignment.  Only mild degenerative disc disease is noted at C5-6 with some loss of disc space and spurring with sclerosis.  The odontoid process is intact.  No cervical spine fracture is seen.  No prevertebral soft tissue swelling is noted.  The thyroid gland is unremarkable.  No adenopathy is seen throughout the neck.  IMPRESSION: Normal alignment with degenerative disc disease at C5-6.  No acute cervical spine fracture.   Original Report Authenticated By: Dwyane Dee, M.D.    Ct Cervical Spine Wo Contrast  12/03/2012  *RADIOLOGY REPORT*  Clinical Data:  Recent fall, history of Parkinson's disease, increased spasticity with weakness  CT HEAD WITHOUT CONTRAST CT MAXILLOFACIAL WITHOUT CONTRAST CT CERVICAL SPINE WITHOUT CONTRAST  Technique:  Multidetector CT imaging of the head, cervical spine, and maxillofacial structures were performed using the standard protocol without intravenous contrast. Multiplanar CT image reconstructions of the cervical spine and maxillofacial  structures were also generated.  Comparison:  CT brain scan of 08/04/2012  CT HEAD  Findings: The ventricular system remains prominent, and the cortical sulci are prominent consistent with diffuse entry.  The septum is in a normal midline position.  Moderate small vessel ischemic change is stable throughout the periventricular white matter.  No hemorrhage, mass lesion, or acute infarction is seen. There does appear to be a right facial hematoma  overlying the zygomatic arch.  IMPRESSION: Atrophy and small vessel disease.  Right facial hematoma.  CT MAXILLOFACIAL  Findings:  A superficial hematoma overlies the right zygomatic arch.  However no fracture of the zygomatic arch is seen.  The orbital rims also appear intact.  The mandible is intact and mandibular condyles are in normal position.  The nasal bone appears intact. The paranasal sinuses appear intact.  IMPRESSION: No maxillofacial fracture.  Hematoma overlies the right zygomatic arch.  CT CERVICAL SPINE  Findings:   The cervical vertebrae are in normal alignment.  Only mild degenerative disc disease is noted at C5-6 with some loss of disc space and spurring with sclerosis.  The odontoid process is intact.  No cervical spine fracture is seen.  No prevertebral soft tissue swelling is noted.  The thyroid gland is unremarkable.  No adenopathy is seen throughout the neck.  IMPRESSION: Normal alignment with degenerative disc disease at C5-6.  No acute cervical spine fracture.   Original Report Authenticated By: Dwyane Dee, M.D.    Dg Knee Complete 4 Views Left  12/03/2012  *RADIOLOGY REPORT*  Clinical Data: Fall, knee pain.  LEFT KNEE - COMPLETE 4+ VIEW  Comparison: None  Findings: No acute bony abnormality.  Specifically, no fracture, subluxation, or dislocation.  Soft tissues are intact.  No joint effusion.  IMPRESSION: No acute bony abnormality.   Original Report Authenticated By: Charlett Nose, M.D.    Dg Knee Complete 4 Views Right  12/03/2012  *RADIOLOGY  REPORT*  Clinical Data: Fall, knee pain.  RIGHT KNEE - COMPLETE 4+ VIEW  Comparison: None.  Findings: Anterior soft tissue swelling over the patella and the joint.  Well corticated bony densities noted along the anterior tibial tuberosity, likely related to old injury.  No acute fracture, subluxation or dislocation.  No joint effusion.  IMPRESSION: Anterior soft tissue swelling.  No acute bony abnormality.   Original Report Authenticated By: Charlett Nose, M.D.    Ct Maxillofacial Wo Cm  12/03/2012  *RADIOLOGY REPORT*  Clinical Data:  Recent fall, history of Parkinson's disease, increased spasticity with weakness  CT HEAD WITHOUT CONTRAST CT MAXILLOFACIAL WITHOUT CONTRAST CT CERVICAL SPINE WITHOUT CONTRAST  Technique:  Multidetector CT imaging of the head, cervical spine, and maxillofacial structures were performed using the standard protocol without intravenous contrast. Multiplanar CT image reconstructions of the cervical spine and maxillofacial structures were also generated.  Comparison:  CT brain scan of 08/04/2012  CT HEAD  Findings: The ventricular system remains prominent, and the cortical sulci are prominent consistent with diffuse entry.  The septum is in a normal midline position.  Moderate small vessel ischemic change is stable throughout the periventricular white matter.  No hemorrhage, mass lesion, or acute infarction is seen. There does appear to be a right facial hematoma overlying the zygomatic arch.  IMPRESSION: Atrophy and small vessel disease.  Right facial hematoma.  CT MAXILLOFACIAL  Findings:  A superficial hematoma overlies the right zygomatic arch.  However no fracture of the zygomatic arch is seen.  The orbital rims also appear intact.  The mandible is intact and mandibular condyles are in normal position.  The nasal bone appears intact. The paranasal sinuses appear intact.  IMPRESSION: No maxillofacial fracture.  Hematoma overlies the right zygomatic arch.  CT CERVICAL SPINE  Findings:    The cervical vertebrae are in normal alignment.  Only mild degenerative disc disease is noted at C5-6 with some loss of disc space and spurring with sclerosis.  The odontoid process  is intact.  No cervical spine fracture is seen.  No prevertebral soft tissue swelling is noted.  The thyroid gland is unremarkable.  No adenopathy is seen throughout the neck.  IMPRESSION: Normal alignment with degenerative disc disease at C5-6.  No acute cervical spine fracture.   Original Report Authenticated By: Dwyane Dee, M.D.     Admission HPI:  Mr. Brightwell is a 62yo M resident of [facility] with h/o PE in 10/2011, HTN, COPD, Bipolar 1 d/o, drug-induced parkinsonism, anxiety, and hypothyroidism presents with frequent falls.  Mr. Usman was markedly somnolent throughout the exam, reducing his ability to give an accurate history. However, it was able to be ascertained that he has had increased frequency of falls since January. He was not able to report on the event that brought him into the hospital, saying that he did not remember it happening. Per ED reports, he has fallen 3x in the past week with increasing LE weakness. He also has a significant R cheek hematoma, swelling of the R knee and thigh and a L knee abrasion as sequalae of a recent fall. The ED also reported that he was supposed to have stopped his warfarin tx recently, but this is not consistent with [facility] records. When talking to the nurse, she reported that he had increased awareness and interaction when he first presented (AAOx4 for her). She also stated that he was unkempt on arrival with food all over him, and he had reported to her that he is left to feed and care for himself most of the time.  The patient did report epigastric pain. He endorsed that it had been many days since his last BM, but has not noticed blood in or darkening of his stool. He also denied blood in urine.  In the ED, PIV was obtained, he was started on IVF and he required 2L [place].   Denies chest pain, shortness of breath, fever, chills, dysuria, hematuria, melena, BRBPR.   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 He is currently living in a facility, presumably due to dementia (patient reports diagnosis of alzheimer's, but actual diagnosis is unclear). On admission, he had increased somnolence and disorientation, which improved during this admission. Overall, polypharmacy was likely the greatest contributor to the patient's presenting symptoms, therefore, one of our goals during this admission was to parse down his medication list to only the necessary medications.  The following things were done during this patient's hospitalization:  -consulted neurology and psychiatry, who assisted in tapering/dc unnecessary meds -depakote taper - 125 mg/day from 2/22-2/25, then discontinue  -lithium taper  - 600 mg/day 2/22-2/24, then 300 mg 2/25-2/28, then discontinue  -olanzapine taper - 10 mg/day 2/22-2/25, then 5 mg 2/26-3/1, then 2.5 mg 3/2-3/5, then stop  -CONTINUE trileptal as prescribed -plan to decrease cogentin in a few weeks after discontinuing above medications. Patient to follow up with neurology as outpatient for optimizing Parkinson's medications -discontinued the following medications: trazodone (patient was using for sleep but can be sedating), fenofibrate (patient already on statin, and TG  normal here, can f/u as outpatient), lasix (likely  contributed to AKI/volume depletion and no known hx of CHF), k-dur (presumably only prescribed because patient was on lasix), metoprolol (patient currently normotensive, no history of CAD), losartan (patient currently normotensive, no history of CAD, proteinuria, though questionable history of gout), alum & mag hydroxide/lactulose/imodium/milk of magnesia (conflicting regimen that both causes and treats diarrhea and constipation)  -started protonix for heartburn    Acute Renal Injury Creatinine was 2.57 on admission, reported baseline is 1.23. BUN 23. Labs and exam consistent with dehydration. Patient was started on IV fluids. Creatinine returned to normal and the last labs before discharge showed creatinine of 1.00  Elevated INR  Patient presented with INR >88m on warfarin. 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 His coumadin was held and his INR decreased. He did not have any signs of active bleeding and his hemoglobin was stable. He should continue with anticoagulation on discharge with close follow up of INR levels to maintain therapeutic levels. On the day of discharge, his INR was 1.96. Also recommend discussing with PCP the duration of therapy and clarifying the records for anticoagulation indication.  Chronic back pain His pain was well controlled on his home vicodin PRN which he may continue at SNF    Discharge Vitals:  BP 124/72  Pulse 79  Temp(Src) 97.6 F (36.4 C) (Oral)  Resp 20  Ht 5\' 8"  (1.727 m)  Wt 231 lb 4.2 oz (104.9 kg)  BMI 35.17 kg/m2  SpO2 98%  Discharge Labs:  Results for orders placed during the hospital encounter of 12/03/12 (from the past 24 hour(s))  GLUCOSE, CAPILLARY     Status: Abnormal   Collection Time    12/07/12 12:13 PM      Result Value Range   Glucose-Capillary 122 (*) 70 - 99 mg/dL  GLUCOSE, CAPILLARY     Status: Abnormal   Collection Time    12/08/12  6:04 AM      Result Value  Range   Glucose-Capillary 129 (*) 70 - 99 mg/dL  PROTIME-INR     Status: Abnormal   Collection Time    12/08/12  7:00 AM      Result Value Range   Prothrombin Time 21.6 (*) 11.6 - 15.2 seconds   INR 1.96 (*) 0.00 - 1.49    Signed: Denton Ar 12/08/2012, 11:53 AM   Time Spent on Discharge: 35 min Services Ordered on Discharge: none Equipment Ordered on Discharge: none

## 2012-12-07 NOTE — Progress Notes (Signed)
I agree. Please see my note for my findings, assessment and plan.

## 2012-12-07 NOTE — Progress Notes (Signed)
ANTICOAGULATION CONSULT NOTE - Follow Up Consult  Pharmacy Consult for Coumadin Indication: History of recurrent pulmonary emboli  Patient Measurements: Height: 5\' 8"  (172.7 cm) Weight: 231 lb 4.2 oz (104.9 kg) IBW/kg (Calculated) : 68.4  Labs:  Recent Labs  12/05/12 0622 12/06/12 0540 12/07/12 0532 12/07/12 0730  LABPROT 25.9* 21.6* 20.6*  --   INR 2.51* 1.96* 1.84*  --   CREATININE 1.09  --   --  1.00   Estimated Creatinine Clearance: 91.1 ml/min (by C-G formula based on Cr of 1).  Medications:  Extensive [facility] MAR med list, includes: Coumadin 1mg  twice a week (Tue/Thu) and 2.5mg  other days  Assessment: 61 YOM on Coumadin prior to admission for history of PE and ordered to continue per pharmacy dosing. Coumadin held x 2 days due to supra-therapeutic INR. Note that patient has a history of repeat falls likely due to PKD meds.  INR today has decreased down to 1.84 from 1.9 yesterday and 2.51 on 2/21, 5.15 on 2/19 admission date.  No reversal agents noted since admit. No bleeding noted.  Skin integrity noted ecchymosis since admit.   Hgb on 2/20 was 9.7 (decreased); platelets stable.  No overt bleeding reported,  but noted to have a hematoma on his R cheek d/t falls and swelling Left knee, abrasions on R knee.   [facility] MAR reviewed on 12/03/12, no obvious interacting medications noted, but high INR on admission may be attributable to the fact that the patient may not have been eating as much PTA.  MD notes today that appetite is good. On regular diet since 12/04/12  PTA coumadin dose was Coumadin 1mg  twice a week (Tue/Thu) and 2.5mg  other days.   GOAL:  INR 2-3 Monitor platelets by anticoagulation protocol: Yes   Plan:  1. Coumadin 2.5 mg tonight.  2. Will check daily PT/INR and CBC at least once a week 3. Will f/up clinical status daily  Thanks, Noah Delaine, RPh Clinical Pharmacist Pager: [phone] 12/07/2012 2:06 PM

## 2012-12-07 NOTE — Progress Notes (Signed)
Subjective: Continues with mild confusion, but with a diagnosis of dementia this might be baseline.   Exam: Filed Vitals:   12/07/12 0556  BP: 150/85  Pulse: 96  Temp: 97.9 F (36.6 C)  Resp: 20   Gen: In bed, NAD MS: Awake, Alert, Smiles when asked year and he gives wrong response. States hospital in [place] for place. Has stutter.  ZO:XWRUE, EOMI Motor: MAEW, some increased tone and cogwheeling in right arm.  Sensory:intact to LT.  Coarse irregular tremor bilaterally   Impression: 62 yo M with polypharmacy and possible, but not definite underlying diagnoses of dementia and parkinson's disease. I agree with medication titration as outlined in family medicine note yesterday. I would advise him to have outpatient neurology evaluation and liekly neruopsychological testing to confirm diagnosis of dementia. Of note, he does have some atrophy on imaging, though this could also be due to longstanding alcohol use.    Recommendations: 1) Agree with titration as planned.  2) Outpatient neurological follow up and consideration of psychometric testing.  3) Neurology will sign off at this time. Please call with any further questions.   Ritta Slot, MD [facility] [phone]  If 7pm- 7am, please page neurology on call at [phone].

## 2012-12-07 NOTE — Progress Notes (Signed)
I agree. Please see my note for my findings, assessment and plan. 

## 2012-12-07 NOTE — Progress Notes (Signed)
Medical Student Daily Progress Note  Subjective: Pt reports continued improvement in SOB and breathing ease.  Appetite good, and thinks UOP is ok after foley d/c.   Patient continues to note no change in mood or thoughts of harming himself or others since continued titration of psych meds.  Agrees with plan to potential d/c tomorrow with continued improvement and no acute change to conditions with medication changes.  Objective: Vital signs in last 24 hours: Filed Vitals:   12/06/12 0843 12/06/12 1339 12/06/12 2135 12/07/12 0556  BP:  131/80 126/84 150/85  Pulse:  98 90 96  Temp:  98.1 F (36.7 C) 97.6 F (36.4 C) 97.9 F (36.6 C)  TempSrc:  Oral Oral Oral  Resp:  20 20 20   Height:      Weight:      SpO2: 96% 96% 97% 99%   Weight change:   Intake/Output Summary (Last 24 hours) at 12/07/12 0715 Last data filed at 12/06/12 1858  Gross per 24 hour  Intake    402 ml  Output    400 ml  Net      2 ml   Physical Exam: BP 150/85  Pulse 96  Temp(Src) 97.9 F (36.6 C) (Oral)  Resp 20  Ht 5\' 8"  (1.727 m)  Wt 104.9 kg (231 lb 4.2 oz)  BMI 35.17 kg/m2  SpO2 99% General appearance: alert, cooperative and no distress Lungs: CTA in anterior lung fields.  NWOB.  Decreased effort to expire breath compared with previous exam reflecting improvement. Heart: regular rate and rhythm, S1, S2 normal, no murmur, click, rub or gallop Abdomen: soft, non-tender; bowel sounds normal; no masses,  no organomegaly Extremities: 2+ BL pitting edema up to lower shin; trace edema to BL knees  Lab Results: Results for orders placed during the hospital encounter of 12/03/12 (from the past 24 hour(s))  OCCULT BLOOD X 1 CARD TO LAB, STOOL     Status: None   Collection Time    12/06/12 12:22 PM      Result Value Range   Fecal Occult Bld NEGATIVE  NEGATIVE  PROTIME-INR     Status: Abnormal   Collection Time    12/07/12  5:32 AM      Result Value Range   Prothrombin Time 20.6 (*) 11.6 - 15.2 seconds    INR 1.84 (*) 0.00 - 1.49   Micro Results: Recent Results (from the past 240 hour(s))  MRSA PCR SCREENING     Status: None   Collection Time    12/03/12  5:10 PM      Result Value Range Status   MRSA by PCR NEGATIVE  NEGATIVE Final   Comment:            The GeneXpert MRSA Assay (FDA     approved for NASAL specimens     only), is one component of a     comprehensive MRSA colonization     surveillance program. It is not     intended to diagnose MRSA     infection nor to guide or     monitor treatment for     MRSA infections.   Studies/Results: No results found. Medications: I have reviewed the patient's current medications. Scheduled Meds: . antiseptic oral rinse  15 mL Mouth Rinse q12n4p  . carbidopa-levodopa  1 tablet Oral TID  . chlorhexidine  15 mL Mouth Rinse BID  . divalproex  125 mg Oral Daily  . levothyroxine  75 mcg Oral QAC breakfast  .  lithium carbonate  600 mg Oral QHS  . mometasone-formoterol  2 puff Inhalation BID  . OLANZapine  10 mg Oral QHS  . OXcarbazepine  300 mg Oral BID  . pantoprazole  40 mg Oral Daily  . senna-docusate  2 tablet Oral BID  . simvastatin  10 mg Oral q1800  . tiotropium  18 mcg Inhalation Daily  . vitamin B-12  1,000 mcg Oral Daily  . Warfarin - Pharmacist Dosing Inpatient   Does not apply q1800   Continuous Infusions:  PRN Meds:.acetaminophen, albuterol, alum & mag hydroxide-simeth, guaiFENesin, HYDROcodone-acetaminophen  Assessment/Plan: Derek Hicks is a 63yo M resident of [place] [facility] with h/o PE (on warfarin therapy), COPD, drug-induced parkinsonism, bipolar disorder, and frequent falls, admitted for SOB and AMS and found to have AKI and supratherapeutic INR.   1. AKI: Resolved. Cr 1.09 on 2/21.  Will not continue to draw daily labs.  2. Acute worsening of mental status: Resolved. Resolution with rehydration points to a combination of AKI and polypharmacy as the culprit for AMS.  With the help of neuro and psych, we have addressed  the issue of polypharmacy and optimized his medications as discussed in the below sections.  3. Drug-induced parkinsonism: Continue regimen per neuro recs.  Plan to d/c benztropine in a few weeks, once he has tolerated changes to his bipolar meds. - Carb/levo 25-100mg  TID - Benztropine 1mg  BID - Continued PT/OT  4. Supratherapeutic INR s/p warfarin therapy for PE: Resolved.  INR today is 1.84. - Warfarin per pharmacy c/s  5. Bipolar d/o: Continue regimen per psych recs.     - Taper depakote: 125 mg/day from 2/22-2/25, then d/c - Taper lithium: 600 mg/day 2/22-2/24, then 300 mg 2/25-2/28, then d/c - Ttaper olanzapine: 10 mg/day 2/22-2/25, then 5 mg 2/26-3/1, then 2.5 mg 3/2-3/5, then stop  - CONTINUE trileptal  6. HTN: BPs have been well controlled, excepting one of 150/85 this AM.  As stated before, when reinstating BP meds, we will potentially start with losartan (with unclear hx of gout), followed by metoprolol (as he has HR in 90s).  - Hold losartan  - Hold metoprolol   7. COPD: Continue home regimen.  - Continue Spiriva  - Continue Advair    8. HC: Stable.  Continue statin therapy. - Continue simvastatin 10mg  daily  9. Hypothyroidism: Continue home regimen.  - Continue levothyroxine daily  10. Sleep disturbance: Continue trazodone d/c.  11. Back spasms: Will continue therapy with valium prn. Has requested 1-2/day while in hospital.   12. FEN/GI:  - Regular - Protonix 40mg  daily  13. Dispo: Floor status. Will likely be d/c tomorrow back to SNF.   LOS: 4 days   This is a Psychologist, occupational Note.  The care of the patient was discussed with Dr. Collier Bullock and the assessment and plan formulated with their assistance.  Please see their attached note for official documentation of the daily encounter.  Wynona Meals Amy 12/07/2012, 7:15 AM

## 2012-12-07 NOTE — Consult Note (Signed)
Patient Identification:  Derek Hicks Date of Evaluation:  12/07/2012 Reason for Consult: Bipolar DO, drug-induced Parkinsonism  Referring Provider: Dr. Collier Bullock FPTS  History of Present Illness: Pt is brought from [facility] with history of HTN, COPD, Bipolar DO, drug-induced parkinsonism, anxiety and hypothroidism.  He is brought to [facility] ED     Interval Hx: 2/23  Seen and chart was reviewed today. Reports he is doing better now and his family visited today. Thinks sleep was fair. Reports less confused now. Reports no acute issues.     Past Psychiatric History  Pt has used many drugs, can't say when: cannabis, LSD, PCP, mushrooms, cocaine, crack cocaine. Heroin [1 time], alcohol, 1+ 6 pack.  He went to rehab twice.  He denies history of suicidal ideation or attempt.   Past Medical History:     Past Medical History  Diagnosis Date  . COPD (chronic obstructive pulmonary disease)   . Pulmonary embolus   . Hypertension   . Bipolar 1 disorder   . Neuroleptic-induced Parkinsonism   . Anxiety   . Hyperlipidemia   . Falls infrequently   . Current use of long term anticoagulation   . Hypothyroidism   . Shortness of breath        Past Surgical History  Procedure Laterality Date  . Rotator cuff repair      x 2     Allergies:  Allergies  Allergen Reactions  . Benzodiazepines Other (See Comments)    Causes aggression, paradoxical reaction  . Other     Anti-anxiety medications    Current Medications:  Prior to Admission medications   Medication Sig Start Date End Date Taking? Authorizing Provider  acetaminophen (TYLENOL) 500 MG tablet Take 500 mg by mouth every 4 (four) hours as needed. For pain/fever over 101.   Yes Historical Provider, MD  albuterol (PROVENTIL HFA;VENTOLIN HFA) 108 (90 BASE) MCG/ACT inhaler Inhale 2 puffs into the lungs every 6 (six) hours as needed. For shortness of breath   Yes Historical Provider, MD  Alum & Mag Hydroxide-Simeth (MI-ACID MAXIMUM STRENGTH  PO) Take 30 mLs by mouth 4 (four) times daily as needed. For heartburn   Yes Historical Provider, MD  amLODipine (NORVASC) 5 MG tablet Take 5 mg by mouth daily.   Yes Historical Provider, MD  benztropine (COGENTIN) 1 MG tablet Take 1 mg by mouth 2 (two) times daily.   Yes Historical Provider, MD  carbidopa-levodopa (SINEMET IR) 25-100 MG per tablet Take 0.5 tablets by mouth 3 (three) times daily.   Yes Historical Provider, MD  divalproex (DEPAKOTE ER) 250 MG 24 hr tablet Take 250 mg by mouth daily.   Yes Historical Provider, MD  fenofibrate (TRICOR) 145 MG tablet Take 145 mg by mouth daily.   Yes Historical Provider, MD  Fluticasone-Salmeterol (ADVAIR) 250-50 MCG/DOSE AEPB Inhale 1 puff into the lungs 2 (two) times daily.   Yes Historical Provider, MD  furosemide (LASIX) 20 MG tablet Take 20 mg by mouth daily.   Yes Historical Provider, MD  guaiFENesin (ROBITUSSIN) 100 MG/5ML SOLN Take 10 mLs by mouth every 6 (six) hours as needed. For cough.   Yes Historical Provider, MD  HYDROcodone-acetaminophen (VICODIN) 5-500 MG per tablet Take 1 tablet by mouth every 6 (six) hours as needed for pain.   Yes Historical Provider, MD  lactulose (CHRONULAC) 10 GM/15ML solution Take 20 g by mouth 2 (two) times daily.   Yes Historical Provider, MD  levothyroxine (SYNTHROID, LEVOTHROID) 75 MCG tablet Take 75 mcg  by mouth daily.   Yes Historical Provider, MD  lithium carbonate (LITHOBID) 300 MG CR tablet Take 900 mg by mouth at bedtime.   Yes Historical Provider, MD  loperamide (IMODIUM A-D) 2 MG tablet Take 2 mg by mouth 4 (four) times daily as needed. For diarrhea   Yes Historical Provider, MD  losartan (COZAAR) 50 MG tablet Take 50 mg by mouth daily.   Yes Historical Provider, MD  magnesium hydroxide (MILK OF MAGNESIA) 400 MG/5ML suspension Take 30 mLs by mouth daily as needed. For heartburn   Yes Historical Provider, MD  metoprolol (LOPRESSOR) 100 MG tablet Take 100 mg by mouth 2 (two) times daily.   Yes Historical  Provider, MD  OLANZapine (ZYPREXA) 20 MG tablet Take 20 mg by mouth at bedtime.   Yes Historical Provider, MD  Oxcarbazepine (TRILEPTAL) 300 MG tablet Take 300 mg by mouth 2 (two) times daily.   Yes Historical Provider, MD  potassium chloride (K-DUR,KLOR-CON) 10 MEQ tablet Take 10 mEq by mouth daily.   Yes Historical Provider, MD  pravastatin (PRAVACHOL) 20 MG tablet Take 20 mg by mouth daily.   Yes Historical Provider, MD  tiotropium (SPIRIVA) 18 MCG inhalation capsule Place 18 mcg into inhaler and inhale daily.   Yes Historical Provider, MD  traZODone (DESYREL) 100 MG tablet Take 100 mg by mouth at bedtime.   Yes Historical Provider, MD  vitamin B-12 (CYANOCOBALAMIN) 1000 MCG tablet Take 1,000 mcg by mouth daily.   Yes Historical Provider, MD  warfarin (COUMADIN) 1 MG tablet Take 1 mg by mouth 2 (two) times a week. Tuesday and Thursday   Yes Historical Provider, MD  warfarin (COUMADIN) 2.5 MG tablet Take 2.5 mg by mouth daily. All days except for Tuesday and Thursday   Yes Historical Provider, MD  furosemide (LASIX) 20 MG tablet Take 1 tablet (20 mg total) by mouth daily. 11/05/11 11/04/12  Simbiso Ranga, MD  potassium chloride (K-DUR,KLOR-CON) 10 MEQ tablet Take 1 tablet (10 mEq total) by mouth daily. 11/05/11 11/04/12  Simbiso Ranga, MD  tiotropium (SPIRIVA) 18 MCG inhalation capsule Place 1 capsule (18 mcg total) into inhaler and inhale daily. 11/05/11 11/04/12  Simbiso Ranga, MD    Social History:    reports that he quit smoking about 10 years ago. He has never used smokeless tobacco. He reports that he does not drink alcohol or use illicit drugs.   Family History:    Family History  Problem Relation Age of Onset  . Aneurysm Mother   . Other      No family history of DVT or PE    Mental Status Examination/Evaluation: Objective:  Appearance: obese and unshaven  Eye Contact::  Good  Speech:  stutters  Volume:  Normal  Mood:  better  Affect:  Congruent  Thought Process:  Disorganized,  Loose and easily confused, poor historian   Orientation:  Other:  Pt is oriented to person place, marriage, little else  Thought Content:  problems organizing thoughts  Suicidal Thoughts:  No  Homicidal Thoughts:  No  Judgement:  Impaired  Insight:  Lacking   DIAGNOSIS:   AXIS I  h/o Bipolar 1, depressed, Polypharmacy Abuse, remote; Drug-induced Parkinsonism, Stuttering  AXIS II  Deferred  AXIS III See medical notes.  AXIS IV other psychosocial or environmental problems, problems related to social environment and Pt has shffling gate, frequent falls and complicated medical conditions.  AXIS V 41-50 serious symptoms    RECOMMENDATION:   1.  Will continue to follow  recommendations by Dr. Ferol Luz (note dated 2/21) 2.  Will follow pt. As needed  12/07/2012 8:08 PM      ROS   Physical Exam

## 2012-12-08 LAB — PROTIME-INR: Prothrombin Time: 21.6 seconds — ABNORMAL HIGH (ref 11.6–15.2)

## 2012-12-08 MED ORDER — OLANZAPINE 5 MG PO TABS: ORAL_TABLET | ORAL | Status: DC

## 2012-12-08 MED ORDER — WARFARIN SODIUM 2.5 MG PO TABS
2.5000 mg | ORAL_TABLET | Freq: Once | ORAL | Status: AC
  Administered 2012-12-08: 2.5 mg via ORAL
  Filled 2012-12-08: qty 1

## 2012-12-08 MED ORDER — DIVALPROEX SODIUM 125 MG PO CPSP: ORAL_CAPSULE | ORAL | Status: DC

## 2012-12-08 MED ORDER — PANTOPRAZOLE SODIUM 40 MG PO TBEC: 40.0000 mg | DELAYED_RELEASE_TABLET | Freq: Every day | ORAL | Status: DC

## 2012-12-08 MED ORDER — HYDROCODONE-ACETAMINOPHEN 5-325 MG PO TABS: 1.0000 | ORAL_TABLET | Freq: Two times a day (BID) | ORAL | Status: DC | PRN

## 2012-12-08 MED ORDER — LITHIUM CARBONATE ER 300 MG PO TBCR: EXTENDED_RELEASE_TABLET | ORAL | Status: DC

## 2012-12-08 MED ORDER — CARBIDOPA-LEVODOPA 25-100 MG PO TABS: 1.0000 | ORAL_TABLET | Freq: Three times a day (TID) | ORAL | Status: DC

## 2012-12-08 NOTE — Progress Notes (Signed)
Clinical Social Work  CSW attempted to meet with patient at bedside to complete assessment. Patient drowsy and unable to participate in assessment. Patient gave CSW permission to contact wife. CSW called wife and left a message with CSW contact information. CSW will continue to follow.  [place], [place] [phone]

## 2012-12-08 NOTE — Clinical Social Work Note (Signed)
CSW was consulted to complete discharge of patient. Pt to transfer to [facility] today via [facility]. Facility and family are aware of d/c. D/C packet complete with chart copy, signed FL2, and signed hard Rx.  CSW signing off as no other CSW needs identified at this time.  Lia Foyer, LCSWA [facility] Clinical Social Worker Contact #: [phone]

## 2012-12-08 NOTE — Progress Notes (Signed)
Resident Co-sign Daily Note: I have seen the patient and reviewed the daily progress note by Jaye Beagle MS 3 and discussed the care of the patient with them.  See below for documentation of my findings, assessment, and plans.  Subjective: Patient is doing well this morning. No complaints, breathing better. Alert and interactive on my exam.  Objective: Vital signs in last 24 hours: Filed Vitals:   12/07/12 2057 12/08/12 0457 12/08/12 1020 12/08/12 1100  BP: 131/83 128/82  124/72  Pulse: 91 100  79  Temp: 99.5 F (37.5 C) 99.1 F (37.3 C)  97.6 F (36.4 C)  TempSrc: Oral Oral  Oral  Resp: 20 20    Height:      Weight:      SpO2: 97% 94% 94% 98%    Physical Exam Blood pressure 124/72, pulse 79, temperature 97.6 F (36.4 C), temperature source Oral, resp. rate 20, height 5\' 8"  (1.727 m), weight 231 lb 4.2 oz (104.9 kg), SpO2 98.00%. General: No acute distress, resting in bed, oriented to person and place, alert and interactive  HEENT: PERRL, EOMI, right cheek hematoma, ecchymoses around the right eye  Cardiovascular: Regular rate and rhythm, no murmurs, rubs or gallops  Respiratory: Clear to auscultation bilaterally, no wheezes, rales, or rhonchi  Abdomen: Soft, slightly distended, no tenderness, bowel sounds present, no rebound or guarding  Extremities: Warm and well-perfused, 2+ edema, left shin abrasion  Skin: Warm, dry, no rashes, good pulses  Neuro: More alert than admission, stuttered speech, resting tremor noted in bilateral upper extremities (mildly improved since admission). Moving all 4 extremities spontaneously.   Lab Results: Reviewed and documented in Electronic Record Micro Results: Reviewed and documented in Electronic Record Studies/Results: Reviewed and documented in Electronic Record  Medications: reviewed Scheduled Meds: . antiseptic oral rinse  15 mL Mouth Rinse q12n4p  . benztropine  1 mg Oral BID  . carbidopa-levodopa  1 tablet Oral TID  .  chlorhexidine  15 mL Mouth Rinse BID  . divalproex  125 mg Oral Daily  . enoxaparin (LOVENOX) injection  0.5 mg/kg Subcutaneous Q24H  . levothyroxine  75 mcg Oral QAC breakfast  . lithium carbonate  600 mg Oral QHS  . mometasone-formoterol  2 puff Inhalation BID  . OLANZapine  10 mg Oral QHS  . OXcarbazepine  300 mg Oral BID  . pantoprazole  40 mg Oral Daily  . senna-docusate  2 tablet Oral BID  . simvastatin  10 mg Oral q1800  . tiotropium  18 mcg Inhalation Daily  . vitamin B-12  1,000 mcg Oral Daily  . warfarin  2.5 mg Oral ONCE-1800  . Warfarin - Pharmacist Dosing Inpatient   Does not apply q1800   Continuous Infusions:  PRN Meds:.acetaminophen, albuterol, guaiFENesin, HYDROcodone-acetaminophen Assessment/Plan: Principal Problem:   Polypharmacy Active Problems:   Pulmonary embolism   Anxiety state   COPD (chronic obstructive pulmonary disease)   Dementia in Parkinson's disease   Neuroleptic-induced Parkinsonism   Falls infrequently   Supratherapeutic INR   AKI (acute kidney injury)   Polysubstance dependence, non-opioid, in remission   AKI: resolved, Cr back to baseline, good PO intake and urine output  AMS in setting of Parkinson's and Polypharmacy Continue medication taper at SNF as follows: -taper depakote - 125 mg/day from 2/22-2/25, then discontinue  -taper lithium - 600 mg/day 2/22-2/24, then 300 mg 2/25-2/28, then discontinue  -taper olanzapine - 10 mg/day 2/22-2/25, then 5 mg 2/26-3/1, then 2.5 mg 3/2-3/5, then stop  -CONTINUE trileptal  -plan  to decrease cogentin in a few weeks after discontinuing above medications. Will set patient up with outpatient neurology to follow-up after discharge.  -discontinued trazodone (patient was using for sleep but can be sedating), fenofibrate (patient already on statin, and TG normal here, can f/u as outpatient), lasix (likely contributed to AKI/volume depletion), k-dur (presumably only prescribed because patient was on lasix),  metoprolol (patient currently normotensive, no history of CAD), losartan (patient currently normotensive, no history of CAD, proteinuria, though questionable history of gout), alum & mag hydroxide/lactulose/imodium/milk of magnesia (conflicting regimen that both causes and treats diarrhea and constipation)  -cont protonix for heartburn  Elevated INR - Resolved  INR 1.96 today, will need INR checked and coumadin dose adjustment at SNF  DVT PPX -coumadin  Dispo -to SNF today, [facility] -wife agreeable -will need INR checks at [facility] and psych and neuro follow up    LOS: 5 days   [place] Ar 12/08/2012, 12:08 PM

## 2012-12-08 NOTE — Progress Notes (Signed)
Pt prepared for d/c to SNF. IV d/c'd. Skin intact except as most recently charted. Vitals are stable. Report called to receiving facility. Pt to be transported by ambulance service. 

## 2012-12-08 NOTE — Progress Notes (Signed)
ANTICOAGULATION CONSULT NOTE - Follow Up Consult  Pharmacy Consult for Coumadin Indication: History of recurrent pulmonary emboli  Patient Measurements: Height: 5\' 8"  (172.7 cm) Weight: 231 lb 4.2 oz (104.9 kg) IBW/kg (Calculated) : 68.4  Labs:  Recent Labs  12/06/12 0540 12/07/12 0532 12/07/12 0730 12/08/12 0700  LABPROT 21.6* 20.6*  --  21.6*  INR 1.96* 1.84*  --  1.96*  CREATININE  --   --  1.00  --    Estimated Creatinine Clearance: 91.1 ml/min (by C-G formula based on Cr of 1).  Medications:  Extensive NH MAR med list, includes: Coumadin 1mg  twice a week (Tue/Thu) and 2.5mg  other days  Assessment: 61 YOM on Coumadin prior to admission for history of PE and ordered to continue per pharmacy dosing. Coumadin held x 2 days due to supra-therapeutic INR. Note that patient has a history of repeat falls likely due to PKD meds.  INR today is 1.96 today. No bleeding noted.  Skin integrity noted ecchymosis since admit.   Hgb on 2/20 was 9.7 (decreased); platelets stable.  No overt bleeding reported,  but noted to have a hematoma on his R cheek d/t falls and swelling Left knee, abrasions on R knee.   NH MAR reviewed on 12/03/12, no obvious interacting medications noted, but high INR on admission may be attributable to the fact that the patient may not have been eating as much PTA.  MD notes today that appetite is good. On regular diet since 12/04/12  PTA coumadin dose was Coumadin 1mg  twice a week (Tue/Thu) and 2.5mg  other days.   GOAL:  INR 2-3 Monitor platelets by anticoagulation protocol: Yes   Plan:  1. Repeat Coumadin 2.5 mg tonight. Hope to resume home dose of 2.5 mg daily X 1 mg Tue/Th in am. 2. Will check daily PT/INR and CBC at least once a week 3. Will f/up clinical status daily 4. On LMWH 50 qday until INR > 2 Herby Abraham, Pharm.D. [phone] 12/08/2012 10:38 AM

## 2012-12-08 NOTE — Progress Notes (Signed)
Medical Student Daily Progress Note  Subjective: The pt was sleeping when I entered the room.  He was arousable, but markedly somnolent when just waking.  Objective: Vital signs in last 24 hours: Filed Vitals:   12/07/12 1314 12/07/12 1915 12/07/12 2057 12/08/12 0457  BP: 128/77  131/83 128/82  Pulse: 103  91 100  Temp: 98 F (36.7 C)  99.5 F (37.5 C) 99.1 F (37.3 C)  TempSrc: Oral  Oral Oral  Resp: 20  20 20   Height:      Weight:      SpO2: 98% 96% 97% 94%   Weight change:   Intake/Output Summary (Last 24 hours) at 12/08/12 0739 Last data filed at 12/07/12 1825  Gross per 24 hour  Intake    685 ml  Output   1050 ml  Net   -365 ml   Physical Exam: BP 128/82  Pulse 100  Temp(Src) 99.1 F (37.3 C) (Oral)  Resp 20  Ht 5\' 8"  (1.727 m)  Wt 104.9 kg (231 lb 4.2 oz)  BMI 35.17 kg/m2  SpO2 94% General appearance: cooperative, no distress and arousable, but sleepy Lungs: CTA in anterior lung fields; referred upper airway sounds Heart: regular rate and rhythm, S1, S2 normal, no murmur, click, rub or gallop Abdomen: soft, non-tender; bowel sounds normal; no masses,  no organomegaly Extremities: 1+ BLE pitting edema  Lab Results: Results for orders placed during the hospital encounter of 12/03/12 (from the past 24 hour(s))  GLUCOSE, CAPILLARY     Status: Abnormal   Collection Time    12/07/12 12:13 PM      Result Value Range   Glucose-Capillary 122 (*) 70 - 99 mg/dL  GLUCOSE, CAPILLARY     Status: Abnormal   Collection Time    12/08/12  6:04 AM      Result Value Range   Glucose-Capillary 129 (*) 70 - 99 mg/dL   Micro Results: Recent Results (from the past 240 hour(s))  MRSA PCR SCREENING     Status: None   Collection Time    12/03/12  5:10 PM      Result Value Range Status   MRSA by PCR NEGATIVE  NEGATIVE Final   Comment:            The GeneXpert MRSA Assay (FDA     approved for NASAL specimens     only), is one component of a     comprehensive MRSA  colonization     surveillance program. It is not     intended to diagnose MRSA     infection nor to guide or     monitor treatment for     MRSA infections.   Studies/Results: Dg Tibia/fibula Right  12/07/2012  *RADIOLOGY REPORT*  Clinical Data: Leg pain post fall  RIGHT TIBIA AND FIBULA - 2 VIEW  Comparison: Right knee 12/03/2012.  Findings: Four views of the right tibia-fibula submitted.  No acute fracture or subluxation.  There is prepatellar soft tissue swelling.  Small joint effusion.  IMPRESSION: No acute fracture or subluxation.  Prepatellar soft tissue swelling.  Small joint effusion right knee.   Original Report Authenticated By: Natasha Mead, M.D.    Medications: I have reviewed the patient's current medications. Scheduled Meds: . antiseptic oral rinse  15 mL Mouth Rinse q12n4p  . benztropine  1 mg Oral BID  . carbidopa-levodopa  1 tablet Oral TID  . chlorhexidine  15 mL Mouth Rinse BID  . divalproex  125 mg Oral  Daily  . enoxaparin (LOVENOX) injection  0.5 mg/kg Subcutaneous Q24H  . levothyroxine  75 mcg Oral QAC breakfast  . lithium carbonate  600 mg Oral QHS  . mometasone-formoterol  2 puff Inhalation BID  . OLANZapine  10 mg Oral QHS  . OXcarbazepine  300 mg Oral BID  . pantoprazole  40 mg Oral Daily  . senna-docusate  2 tablet Oral BID  . simvastatin  10 mg Oral q1800  . tiotropium  18 mcg Inhalation Daily  . vitamin B-12  1,000 mcg Oral Daily  . Warfarin - Pharmacist Dosing Inpatient   Does not apply q1800   Continuous Infusions:  PRN Meds:.acetaminophen, albuterol, guaiFENesin, HYDROcodone-acetaminophen  Assessment/Plan: [name] is a 62yo M resident of [place] [facility] with h/o PE (on warfarin therapy), COPD, drug-induced parkinsonism, bipolar disorder, and frequent falls, admitted for SOB and AMS and found to have AKI and supratherapeutic INR.   1. AKI: Resolved. Cr 1.00 on 2/23.  2. Acute worsening of mental status: Resolved. Polypharmacy addressed in the below  sections.  3. Drug-induced parkinsonism: Continue regimen per neuro recs. Will schedule neuro f/u appt. - Carb/levo 25-100mg  TID  - Benztropine 1mg  BID  - Continued PT/OT --> recommend SNF placement  4. Supratherapeutic INR s/p warfarin therapy for PE: Resolved. INR today is 1.96.  - Warfarin per pharmacy c/s   5. Bipolar d/o: Continue regimen per psych recs. Will schedule psych f/u appt. - Taper depakote: 125 mg/day from 2/22-2/25, then d/c  - Taper lithium: 600 mg/day 2/22-2/24, then 300 mg 2/25-2/28, then d/c  - Ttaper olanzapine: 10 mg/day 2/22-2/25, then 5 mg 2/26-3/1, then 2.5 mg 3/2-3/5, then stop  - CONTINUE trileptal   IF DISCHARGED TODAY, his d/c psych meds are as follows: Depakote 125mg ; Take last dose of 1 capsule on 12/09/12 and discontinue medication thereafter; Dispense 1 capsule Lithium 300mg ; Take 1 tablet for 3 days and discontinue medication thereafter; Dispense 3 tablets Olanzapine 10mg ; Take 1 tablet for 1 day (2/25), half a tablet for 4 days (2/26-3/1), and a quarter of a tablet for 4 days (3/2-3/5), then discontinue medication thereafter; Dispense 4 tablets Resume previously prescribed trileptal  6. HTN: Some episodes of elevated BP yesterday (200/102 at 12:10), although they came back down and are within goal this AM.  Continue to hold BP meds. - Hold losartan  - Hold metoprolol   7. COPD: Continue home regimen.  - Continue Spiriva  - Continue Advair   8. HC: Stable. Continue statin therapy.  - Continue simvastatin 10mg  daily   9. Hypothyroidism: Continue home regimen.  - Continue levothyroxine daily  10. Sleep disturbance: Stable without medication.   11. Back spasms: Will continue therapy with valium prn. Has requested 1-2/day while in hospital.   12. FEN/GI:  - Regular  - Protonix 40mg  daily   13. Dispo: Floor status. Will likely be d/c to SNF per SW placement.   LOS: 5 days   This is a Psychologist, occupational Note.  The care of the patient was  discussed with Dr. Collier Bullock and the assessment and plan formulated with their assistance.  Please see their attached note for official documentation of the daily encounter.  Wynona Meals Amy 12/08/2012, 7:39 AM

## 2012-12-08 NOTE — Progress Notes (Signed)
Clinical Social Work Department CLINICAL SOCIAL WORK PSYCHIATRY SERVICE LINE ASSESSMENT 12/08/2012  Patient:  Derek Hicks  Account:  [id number]  Admit Date:  12/03/2012  Clinical Social Worker:  Unk Lightning, LCSW  Date/Time:  12/08/2012 10:15 AM Referred by:  Physician  Date referred:  12/08/2012 Reason for Referral  Behavioral Health Issues   Presenting Symptoms/Problems (In the person's/family's own words):   Psych consulted to assist with bipolar meds   Abuse/Neglect/Trauma History (check all that apply)  Denies history   Abuse/Neglect/Trauma Comments:   Psychiatric History (check all that apply)  Outpatient treatment  Inpatient/hospitilization   Psychiatric medications:  Cogentin 1 mg  Depakote Sprinkle 125 mg  Zyprexa 10 mg   Current Mental Health Hospitalizations/Previous Mental Health History:   Patient unable to participate in assessment at this time. Wife reports that patient was formally diagnosed with bipolar seven years ago but reports that he has suffered from "mood cycles" for several years without treatment.   Current provider:   [facility] was prescribing medication most recently.   Place and Date:   [place], [place]   Current Medications:   acetaminophen, albuterol, guaiFENesin, HYDROcodone-acetaminophen            . antiseptic oral rinse  15 mL Mouth Rinse q12n4p  . benztropine  1 mg Oral BID  . carbidopa-levodopa  1 tablet Oral TID  . chlorhexidine  15 mL Mouth Rinse BID  . divalproex  125 mg Oral Daily  . enoxaparin (LOVENOX) injection  0.5 mg/kg Subcutaneous Q24H  . levothyroxine  75 mcg Oral QAC breakfast  . lithium carbonate  600 mg Oral QHS  . mometasone-formoterol  2 puff Inhalation BID  . OLANZapine  10 mg Oral QHS  . OXcarbazepine  300 mg Oral BID  . pantoprazole  40 mg Oral Daily  . senna-docusate  2 tablet Oral BID  . simvastatin  10 mg Oral q1800  . tiotropium  18 mcg Inhalation Daily  . vitamin B-12  1,000 mcg Oral  Daily  . warfarin  2.5 mg Oral ONCE-1800  . Warfarin - Pharmacist Dosing Inpatient   Does not apply q1800   Previous Impatient Admission/Date/Reason:   Patient was most recently hospitalized in September 2013 at [facility] behavioral for manic episode and patient was having violent thoughts about hurting other residents. Patient was hospitalized for about 2 weeks and medications were changed. Patient was hospitalized at [facility] about 10 years ago and has also been at [facility]  and [facility] in the past.   Emotional Health / Current Symptoms    Suicide/Self Harm  None reported   Suicide attempt in the past:   Patient unable to participate in assessment at this time but wife reports no behaviors from patient. Wife reports that patient has no previous suicide attempts and has not voiced any thoughts of hurting others.   Other harmful behavior:   None currently.   Psychotic/Dissociative Symptoms  Auditory Hallucinations   Other Psychotic/Dissociative Symptoms:   Wife reports that patient has claimed to hear voices in the past. Wife reports no current reports of AH.    Attention/Behavioral Symptoms  Unable to accurately assess   Other Attention / Behavioral Symptoms:    Cognitive Impairment  Unable to accurately assess   Other Cognitive Impairment:    Mood and Adjustment  Lethargic    Stress, Anxiety, Trauma, Any Recent Loss/Stressor  None reported   Anxiety (frequency):   Phobia (specify):   Compulsive behavior (specify):  Obsessive behavior (specify):   Other:   Substance Abuse/Use  None   SBIRT completed (please refer for detailed history):  N  Self-reported substance use:   Patient has been sober for 10 years.   Urinary Drug Screen Completed:  N Alcohol level:    Environmental/Housing/Living Arrangement  Assisted Living / Group Home   Who is in the home:   [facility]   Emergency contact:  Jeanette-wife   Financial  Medicare  Medicaid    Patient's Strengths and Goals (patient's own words):   Patient has supportive wife and stable housing through ALF.   Clinical Social Worker's Interpretive Summary:   CSW received referral to complete psychosocial assessment. CSW reviewed chart and attempted to meet with patient. Patient drowsy and unable to participate in assessment but agreeable to CSW to contact wife.    CSW spoke with wife via phone. Wife reports that patient has lived at ALFs since January 2013. Wife reports that patient struggled with ambulating due to blood clots in legs and lungs and therefore could not stay at home. Patient had several falls at home and wife felt she could no longer provide safe care for patient. Wife reports that patient will dc [facility] and then will return to ALF.    Patient was formally diagnosed with bipolar about seven years ago. Wife feels that patient suffered from bipolar before he was diagnosed. Patient follows up at [facility] behavioral health but [facility] managed medications after he moved into their facility. Wife reports that patient has cycles and every fall patient becomes manic. Wife reports that around this time every year, patient becomes more stable in his mood.    Patient has been hospitalized several times in the past due to manic episodes. Patient has experienced violent thoughts in the past and was hospitalized this past fall due to thoughts. Patient has expressed AH in the past but wife reports no psychotic features mentioned recently.    Patient has been sober for the past 10 years. Patient and wife have been married for the past 10 years and wife knowledgeable of patient's MH and SA history.    CSW will staff case with psych MD and will follow any recommendations provided.   Disposition:  Recommend Psych CSW continuing to support while in hospital

## 2012-12-08 NOTE — Consult Note (Signed)
Patient Identification:  Derek Hicks Date of Evaluation:  12/08/2012 Reason for Consult: Drug induced Parkinsonism  Referring Provider: Katheran Awe MD  History of Present Illness: Pt is brought from [facility] with history of HTN, COPD, Bipolar DO, drug-induced parkinsonism, anxiety and hypothroidism. He is brought to [facility] ED  On first evaluation, he had great difficulty completing a sentence,  His stuttering was significant.  He also had a fine had tremor.  Review of medications indicated that a slow taper of medications may improve function, trying to maximize Dopamine effect of Carba-dopa.  Today he is speaking more clearly with minimal if any stuttering.   Past Psychiatric History: Pt has used many drugs, can't say when: cannabis, LSD, PCP, mushrooms, cocaine, crack cocaine. Heroin [1 time], alcohol, 1+ 6 pack. He went to rehab twice. He denies history of suicidal ideation or attempt.   Past Medical History:     Past Medical History  Diagnosis Date  . COPD (chronic obstructive pulmonary disease)   . Pulmonary embolus   . Hypertension   . Bipolar 1 disorder   . Neuroleptic-induced Parkinsonism   . Anxiety   . Hyperlipidemia   . Falls infrequently   . Current use of long term anticoagulation   . Hypothyroidism   . Shortness of breath        Past Surgical History  Procedure Laterality Date  . Rotator cuff repair      x 2     Allergies:  Allergies  Allergen Reactions  . Benzodiazepines Other (See Comments)    Causes aggression, paradoxical reaction  . Other     Anti-anxiety medications    Current Medications:  Prior to Admission medications   Medication Sig Start Date End Date Taking? Authorizing Provider  acetaminophen (TYLENOL) 500 MG tablet Take 500 mg by mouth every 4 (four) hours as needed. For pain/fever over 101.   Yes Historical Provider, MD  albuterol (PROVENTIL HFA;VENTOLIN HFA) 108 (90 BASE) MCG/ACT inhaler Inhale 2 puffs into the lungs every 6 (six) hours as  needed. For shortness of breath   Yes Historical Provider, MD  benztropine (COGENTIN) 1 MG tablet Take 1 mg by mouth 2 (two) times daily.   Yes Historical Provider, MD  Fluticasone-Salmeterol (ADVAIR) 250-50 MCG/DOSE AEPB Inhale 1 puff into the lungs 2 (two) times daily.   Yes Historical Provider, MD  levothyroxine (SYNTHROID, LEVOTHROID) 75 MCG tablet Take 75 mcg by mouth daily.   Yes Historical Provider, MD  Oxcarbazepine (TRILEPTAL) 300 MG tablet Take 300 mg by mouth 2 (two) times daily.   Yes Historical Provider, MD  pravastatin (PRAVACHOL) 20 MG tablet Take 20 mg by mouth daily.   Yes Historical Provider, MD  tiotropium (SPIRIVA) 18 MCG inhalation capsule Place 18 mcg into inhaler and inhale daily.   Yes Historical Provider, MD  vitamin B-12 (CYANOCOBALAMIN) 1000 MCG tablet Take 1,000 mcg by mouth daily.   Yes Historical Provider, MD  warfarin (COUMADIN) 1 MG tablet Take 1 mg by mouth 2 (two) times a week. Tuesday and Thursday   Yes Historical Provider, MD  warfarin (COUMADIN) 2.5 MG tablet Take 2.5 mg by mouth daily. All days except for Tuesday and Thursday   Yes Historical Provider, MD  carbidopa-levodopa (SINEMET IR) 25-100 MG per tablet Take 1 tablet by mouth 3 (three) times daily. 12/08/12   Larey Seat, MD  divalproex (DEPAKOTE SPRINKLE) 125 MG capsule Take one capsule on 12/09/12, then stop this medication 12/08/12   Larey Seat, MD  HYDROcodone-acetaminophen (NORCO/VICODIN) 5-325 MG per tablet Take 1 tablet by mouth 2 (two) times daily as needed. 12/08/12   Larey Seat, MD  lithium carbonate (LITHOBID) 300 MG CR tablet Take one tablet per day for three days, then stop this medication 12/08/12   Larey Seat, MD  OLANZapine [facility]) 5 MG tablet Take 2 tabs on 12/09/12, 1 tab for 4 days (2/26-3/1), then a half-tab for 4 days (3/2-3/5), then stop this medication 12/08/12   Larey Seat, MD  pantoprazole (PROTONIX) 40 MG tablet Take 1 tablet (40 mg total) by mouth daily. 12/08/12   Larey Seat, MD    Social History:    reports that he quit smoking about 10 years ago. He has never used smokeless tobacco. He reports that he does not drink alcohol or use illicit drugs.   Family History:    Family History  Problem Relation Age of Onset  . Aneurysm Mother   . Other      No family history of DVT or PE    Mental Status Examination/Evaluation: Objective:  Appearance: bearded, obese  Eye Contact::  Good  Speech:  Clear and Coherent and Normal Rate  Volume:  Normal  Mood:  smiling  Affect:  Appropriate and Congruent  Thought Process:  Coherent and Logical  Orientation:  Other:  Concentration and memory function is poor  Thought Content:  anticipated going to SNF  Suicidal Thoughts:  No  Homicidal Thoughts:  No  Judgement:  Impaired  Insight:  Lacking   DIAGNOSIS:   AXIS I  h/o Bipolar 1, depressed most recent; Polysubstance abuse, remote; Drug-induced Parkinsonism, Stuttering, r/o psychogenic cause  AXIS II  Deferred  AXIS III See medical notes.  AXIS IV economic problems, housing problems, other psychosocial or environmental problems, problems related to social environment and Significant drug use apparently has contributed to Parkinsonism; current taper of Dopamine blocking agent Zyprexa is slowly being tapered to DC.   AXIS V 41-50 serious symptoms   Assessment/Plan:   Discussed with MD, Katheran Awe MD resident, Psych CSW, Dr. Hollice Gong Pt is awake, aware and has good eye contact.  He appears relaxed, no tremors and speech is noticeable more clear without stuttering.  He says he is feeling good and knows he is going to SNF. RECOMMENDATION:  1.  Pt does not have capacity, need to rule out psychogenic symptoms {stuttering, tremor?] 2.  Pt agrees to SNF 3.  Noted:  Plans [12/05/12] to taper lithium, Depakote and Zyprexa continued.  4.  No further psychiatric needs.  MD Psychiatrist signs off Kenslee Achorn MD 12/08/2012 2:09 PM

## 2012-12-08 NOTE — Progress Notes (Signed)
Internal Medicine Attending  Date: 12/08/2012  Patient name: Derek Hicks Medical record number: [id number] Date of birth: 1951-04-23 Age: 62 y.o. Gender: male  I saw and evaluated the patient. I reviewed the resident's note by Dr. Collier Bullock and I agree with the resident's findings and plans as documented in her progress note.  [name] was somnolent but arousable this morning. He was without specific complaints. The plan is to transfer him to a skilled nursing facility today on a markedly reduced and tapering medication list. The likely cause of his recent falls was the polypharmacy and this has been aggressively addressed during this admission. I agree with the housestaff's plan to transfer him to a skilled nursing facility for further care.

## 2012-12-15 ENCOUNTER — Telehealth (HOSPITAL_COMMUNITY): Payer: Self-pay

## 2012-12-15 NOTE — Telephone Encounter (Signed)
10:17am 12/15/12 called and left msg that provider is out of the office need to r/s the appt./sh

## 2012-12-16 ENCOUNTER — Ambulatory Visit (HOSPITAL_COMMUNITY): Payer: Medicare Other | Admitting: Psychiatry

## 2012-12-22 ENCOUNTER — Encounter (HOSPITAL_COMMUNITY): Payer: Self-pay | Admitting: Psychiatry

## 2012-12-22 ENCOUNTER — Ambulatory Visit (INDEPENDENT_AMBULATORY_CARE_PROVIDER_SITE_OTHER): Payer: 59 | Admitting: Psychiatry

## 2012-12-22 VITALS — BP 160/100 | HR 80 | Resp 16 | Wt 222.8 lb

## 2012-12-22 DIAGNOSIS — F319 Bipolar disorder, unspecified: Secondary | ICD-10-CM

## 2012-12-22 NOTE — Progress Notes (Signed)
Patient ID: Derek Hicks, male   DOB: [birth year], 62 y.o.   MRN: [id number] Chief complaint I have history of bipolar disorder.  I was recently discharged from hospital.  History presenting illness Patient is 62 year old Caucasian married unemployed man who is referred from medical floor upon his discharge.  Patient has history of bipolar disorder and need psychiatric care.  Patient is a poor historian and do not remember much detail about his psychiatric illness but endorse that he has history of severe mood swing anger and irritability and diagnosed with bipolar disorder.  His last psychiatric admission was 8 months ago at [facility].  At that time patient was living in a skilled nursing facility and he was threatening other client.  Patient was admitted to [facility] in February 2014 do to fall.  He was seen by psychiatry consultation liaison and recommended reducing medication.  At that time patient was taking multiple psychotropic medication and apparently lithium was added recently to control his agitation.  Patient remember frequent fall and unable to function.  His Depakote and lithium was discontinued and he was started on Trileptal.  He was also recommended to decrease Zyprexa gradually and eventually discontinued however patient is still taking Zyprexa 10 mg at bedtime.  It is unclear why he is taking Zyprexa 10 mg despite it was recommended to discontinue.  Patient admitted that he takes Zyprexa because he cannot sleep very well.  Patient endorse his current medication is helping he does not have any agitation anger mood swing or voices.  He likes his new facility which she's been living for 3 weeks.  Earlier than that he was living in a different facility but patient does not know why he was discharged the same facility.  Patient is trying to make some friends.  He denies any side effects of medication.  He denies any recent paranoia or any hallucination.  He endorse sleep is  improved but he continues to have some anxiety.  He denies any active or passive suicidal thoughts.  He like the staff at that facility.  Past psychiatric history Patient endorse at least 5 psychiatric hospitalization in his life.  Patient was admitted 3 times at [facility].  His last psychiatric admission was 8 months ago at [facility].  His psychotropic medication was given by Dr. Audelia Hives who is a new psychiatrist at [facility].  Patient denies any history of suicidal attempt or any homicidal attempt but admitted history of hallucination psychosis paranoia and passive suicidal thoughts.  In the past he had tried Seroquel, Risperdal, lithium and Depakote.  Patient may have tried more but do not remember the details.  He used to hear voices on and off but for past 3 years he has no hallucinations.  Family history Patient endorse mother has history of bipolar disorder.  Psychosocial history Patient was born and raised in [place] [facility] area.  He's been married 3 times.  He has 2 children, 48 year old son and 51 years old daughter.  Last year patient was living at home with his wife however his wife recommended that patient needs nursing home facility.  Patient has multiple physical problem.  He was diagnosed 3 years ago with Parkinson.  He was living in a [facility] nursing home for 7 months however recently he was discharged to [facility] nursing home.  Patient has limited contact with his wife.  Patient usually talks with her daughter but he has no contact with the  son.  Patient has one brother who lives in [place].    Education and work history. Patient has high school education.  He is currently unemployed.  Medical history . Patient has multiple medical problems.  He has Parkinson disease, cognitive impairment, history of pulmonary embolism, COPD, hypertension , hyperlipidemia , multiple fall and tremors.  He was recently seen by [facility]  neurology and he was recommended to continue his current medication for Parkinson.    Alcohol and substance use history Patient endorsed long history of using cocaine, marijuana, speed balls, intravenous heroin and alcohol.  He claims to be sober from illegal substance use for past 15 years.  His last drink was 2002.  Review of Systems  Neurological: Positive for tremors.  Psychiatric/Behavioral: Positive for memory loss. Negative for depression, suicidal ideas, hallucinations and substance abuse. The patient is nervous/anxious and has insomnia.    Mental status examination Patient is a middle-aged man who appears older than his his stated age.  He uses a wheelchair to avoid any fall.  He is using oxygen.  He has difficulty in talking.  His his speech is at times stuttering.  He is a poor historian and he has difficulty organizing his thoughts however he was able to answer yes and no and short sentences.  He described his mood is anxious and his affect is mood appropriate.  He denies any active or passive suicidal thoughts or homicidal thoughts.  He denies any auditory or visual hallucination.  His attention and concentration is distracted.  There were no paranoia or delusion obsession present at this time.  He has mild tremors.  His thought processes slow but logical and goal-directed.  He has difficulty recalling events.  He's alert and oriented x2.  His insight judgment and impulse control is fair.  Assessment Axis I bipolar disorder NOS, polysubstance dependence in complete sustained remission Axis II deferred Axis III Axis IV mild to moderate Axis V 50-55  Plan At this time patient is fairly stable on his current psychiatric medication.  He is taking Trileptal 300 mg twice a day.  He is also taking Zyprexa 10 mg at bedtime.  It is unclear why he is taking Zyprexa which was recommended by consultation liaison service is to stop.  However patient feel comfortable with Zyprexa at present does.   He does not have any recent fall.  He endorse his sleep and hallucination is much control.  He is not taking Depakote and lithium.  I recommend to continue present dose of Zyprexa and Trileptal.  I will schedule him to see therapist for coping and social skills.  Risk and benefit explain in detail.  We will consider lowering the Zyprexa and the future if needed.  I will see him again in 4 weeks.  His medication has been managed by his primary care physician Dr. Bufford Spikes.  He is getting psychotropic medication from his primary care physician.  We will not prescribe any new medication at this time.  Recommend to call us back if he is any question or concern or if he feel that sinus symptoms are worsening.  I will see him again in 4 weeks.  Time spent 60 minutes.

## 2012-12-31 ENCOUNTER — Ambulatory Visit (INDEPENDENT_AMBULATORY_CARE_PROVIDER_SITE_OTHER): Payer: 59 | Admitting: Psychiatry

## 2012-12-31 DIAGNOSIS — F319 Bipolar disorder, unspecified: Secondary | ICD-10-CM

## 2013-01-01 ENCOUNTER — Encounter (HOSPITAL_COMMUNITY): Payer: Self-pay | Admitting: Psychiatry
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
## 2013-01-20 ENCOUNTER — Encounter: Payer: Self-pay | Admitting: Adult Health

## 2013-01-20 ENCOUNTER — Non-Acute Institutional Stay (SKILLED_NURSING_FACILITY): Payer: 59 | Admitting: Adult Health

## 2013-01-20 ENCOUNTER — Ambulatory Visit (HOSPITAL_COMMUNITY): Payer: Self-pay | Admitting: Psychiatry

## 2013-01-20 DIAGNOSIS — K219 Gastro-esophageal reflux disease without esophagitis: Secondary | ICD-10-CM

## 2013-01-20 DIAGNOSIS — E785 Hyperlipidemia, unspecified: Secondary | ICD-10-CM

## 2013-01-20 DIAGNOSIS — F319 Bipolar disorder, unspecified: Secondary | ICD-10-CM

## 2013-01-20 DIAGNOSIS — T43591A Poisoning by other antipsychotics and neuroleptics, accidental (unintentional), initial encounter: Secondary | ICD-10-CM

## 2013-01-20 DIAGNOSIS — G219 Secondary parkinsonism, unspecified: Secondary | ICD-10-CM

## 2013-01-20 DIAGNOSIS — N400 Enlarged prostate without lower urinary tract symptoms: Secondary | ICD-10-CM

## 2013-01-20 DIAGNOSIS — Z7901 Long term (current) use of anticoagulants: Secondary | ICD-10-CM

## 2013-01-20 DIAGNOSIS — I10 Essential (primary) hypertension: Secondary | ICD-10-CM

## 2013-01-20 DIAGNOSIS — I2699 Other pulmonary embolism without acute cor pulmonale: Secondary | ICD-10-CM

## 2013-01-20 DIAGNOSIS — J449 Chronic obstructive pulmonary disease, unspecified: Secondary | ICD-10-CM

## 2013-01-20 DIAGNOSIS — G2111 Neuroleptic induced parkinsonism: Secondary | ICD-10-CM

## 2013-01-20 DIAGNOSIS — E039 Hypothyroidism, unspecified: Secondary | ICD-10-CM

## 2013-01-27 ENCOUNTER — Encounter (HOSPITAL_COMMUNITY): Payer: Self-pay | Admitting: Emergency Medicine

## 2013-01-27 ENCOUNTER — Emergency Department (HOSPITAL_COMMUNITY)
Admission: EM | Admit: 2013-01-27 | Discharge: 2013-01-28 | Disposition: A | Discharge status: Skilled Nursing Facility | Payer: Medicare Other | Attending: Emergency Medicine | Admitting: Emergency Medicine

## 2013-01-27 DIAGNOSIS — I10 Essential (primary) hypertension: Secondary | ICD-10-CM | POA: Insufficient documentation

## 2013-01-27 DIAGNOSIS — E785 Hyperlipidemia, unspecified: Secondary | ICD-10-CM | POA: Insufficient documentation

## 2013-01-27 DIAGNOSIS — I252 Old myocardial infarction: Secondary | ICD-10-CM | POA: Insufficient documentation

## 2013-01-27 DIAGNOSIS — Z7901 Long term (current) use of anticoagulants: Secondary | ICD-10-CM | POA: Insufficient documentation

## 2013-01-27 DIAGNOSIS — J449 Chronic obstructive pulmonary disease, unspecified: Secondary | ICD-10-CM | POA: Insufficient documentation

## 2013-01-27 DIAGNOSIS — F319 Bipolar disorder, unspecified: Secondary | ICD-10-CM | POA: Insufficient documentation

## 2013-01-27 DIAGNOSIS — Z87891 Personal history of nicotine dependence: Secondary | ICD-10-CM | POA: Insufficient documentation

## 2013-01-27 DIAGNOSIS — J4489 Other specified chronic obstructive pulmonary disease: Secondary | ICD-10-CM | POA: Insufficient documentation

## 2013-01-27 DIAGNOSIS — Z79899 Other long term (current) drug therapy: Secondary | ICD-10-CM | POA: Insufficient documentation

## 2013-01-27 DIAGNOSIS — G219 Secondary parkinsonism, unspecified: Secondary | ICD-10-CM | POA: Insufficient documentation

## 2013-01-27 DIAGNOSIS — E039 Hypothyroidism, unspecified: Secondary | ICD-10-CM | POA: Insufficient documentation

## 2013-01-27 DIAGNOSIS — I160 Hypertensive urgency: Secondary | ICD-10-CM

## 2013-01-27 DIAGNOSIS — R51 Headache: Secondary | ICD-10-CM | POA: Insufficient documentation

## 2013-01-27 DIAGNOSIS — Z9181 History of falling: Secondary | ICD-10-CM | POA: Insufficient documentation

## 2013-01-27 DIAGNOSIS — IMO0002 Reserved for concepts with insufficient information to code with codable children: Secondary | ICD-10-CM | POA: Insufficient documentation

## 2013-01-27 HISTORY — DX: Acute myocardial infarction, unspecified: I21.9

## 2013-01-27 LAB — POCT I-STAT, CHEM 8
BUN: 6 mg/dL (ref 6–23)
Chloride: 96 mEq/L (ref 96–112)
Glucose, Bld: 142 mg/dL — ABNORMAL HIGH (ref 70–99)
HCT: 50 % (ref 39.0–52.0)
Potassium: 3.8 mEq/L (ref 3.5–5.1)

## 2013-01-27 MED ORDER — LOSARTAN POTASSIUM 50 MG PO TABS
50.0000 mg | ORAL_TABLET | Freq: Once | ORAL | Status: AC
  Administered 2013-01-27: 50 mg via ORAL
  Filled 2013-01-27: qty 1

## 2013-01-27 MED ORDER — HYDROXYZINE HCL 25 MG PO TABS
25.0000 mg | ORAL_TABLET | Freq: Once | ORAL | Status: AC
  Administered 2013-01-28: 25 mg via ORAL
  Filled 2013-01-27: qty 1

## 2013-01-27 MED ORDER — HYDROXYZINE HCL 25 MG PO TABS
25.0000 mg | ORAL_TABLET | Freq: Once | ORAL | Status: AC
  Administered 2013-01-27: 25 mg via ORAL
  Filled 2013-01-27: qty 1

## 2013-01-27 MED ORDER — LOSARTAN POTASSIUM 50 MG PO TABS: 50.0000 mg | ORAL_TABLET | Freq: Two times a day (BID) | ORAL | Status: DC

## 2013-01-27 MED ORDER — CLONIDINE HCL 0.1 MG PO TABS
0.2000 mg | ORAL_TABLET | Freq: Once | ORAL | Status: AC
  Administered 2013-01-27: 0.2 mg via ORAL
  Filled 2013-01-27: qty 2

## 2013-01-27 MED ORDER — CLONIDINE HCL 0.1 MG PO TABS: 0.1000 mg | ORAL_TABLET | Freq: Once | ORAL | Status: DC

## 2013-01-27 NOTE — ED Provider Notes (Signed)
I saw  the patient, reviewed the resident's note and I agree with the findings and plan.   .Face to face Exam:  General:  Awake HEENT:  Atraumatic Resp:  Normal effort Abd:  Nondistended Neuro:No focal weakness    Nelia Shi, MD 01/27/13 [phone]

## 2013-01-27 NOTE — ED Provider Notes (Signed)
History     CSN: [id number]  Arrival date & time 01/27/13  2013   None     Chief Complaint  Patient presents with  . Hypertension    (Consider location/radiation/quality/duration/timing/severity/associated sxs/prior treatment) HPI 62 yo M with COPD and HTN who presents from [facility] with hypertension.  Per the [facility], he is on cozaar daily for HTN and has clonidine prn for elevated BPs.  He received a dose of clonidine at the nursing home and his blood pressure continued to increase so EMS was called.  He states he has a "twinge" of a headache.  Denies any vision changes, n/v/abd pain, diarrhea, fevers, change in urination.  He states that several months ago he was on multiple blood pressure medications, but these were stopped during a hospitalization in February.   Past Medical History  Diagnosis Date  . COPD (chronic obstructive pulmonary disease)   . Pulmonary embolus   . Hypertension   . Bipolar 1 disorder   . Neuroleptic-induced Parkinsonism   . Anxiety   . Hyperlipidemia   . Falls infrequently   . Current use of long term anticoagulation   . Hypothyroidism   . Shortness of breath   . MI (myocardial infarction) 2006    Past Surgical History  Procedure Laterality Date  . Rotator cuff repair      x 2     Family History  Problem Relation Age of Onset  . Aneurysm Mother   . Bipolar disorder Mother   . Other      No family history of DVT or PE    History  Substance Use Topics  . Smoking status: Former Smoker -- 20 years    Quit date: 10/15/2002  . Smokeless tobacco: Never Used  . Alcohol Use: No      Review of Systems  Neurological: Positive for headaches ("twinge").  All other systems reviewed and are negative.    Allergies  Benzodiazepines and Other  Home Medications   Current Outpatient Rx  Name  Route  Sig  Dispense  Refill  . acetaminophen (TYLENOL) 500 MG tablet   Oral   Take 500 mg by mouth every 4 (four) hours as needed. For  pain/fever over 101.         . albuterol (PROVENTIL HFA;VENTOLIN HFA) 108 (90 BASE) MCG/ACT inhaler   Inhalation   Inhale 2 puffs into the lungs every 6 (six) hours as needed. For shortness of breath         . benztropine (COGENTIN) 1 MG tablet   Oral   Take 1 mg by mouth 2 (two) times daily.         . carbidopa-levodopa (SINEMET IR) 25-100 MG per tablet   Oral   Take 1 tablet by mouth 3 (three) times daily.         . Fluticasone-Salmeterol (ADVAIR) 250-50 MCG/DOSE AEPB   Inhalation   Inhale 1 puff into the lungs 2 (two) times daily.         Marland Kitchen HYDROcodone-acetaminophen (NORCO/VICODIN) 5-325 MG per tablet   Oral   Take 1 tablet by mouth 2 (two) times daily as needed.   60 tablet   0   . levothyroxine (SYNTHROID, LEVOTHROID) 75 MCG tablet   Oral   Take 75 mcg by mouth daily.         Marland Kitchen OLANZapine (ZYPREXA) 5 MG tablet      Take 2 tabs on 12/09/12, 1 tab for 4 days (2/26-3/1), then a half-tab for  4 days (3/2-3/5), then stop this medication         . Oxcarbazepine (TRILEPTAL) 300 MG tablet   Oral   Take 300 mg by mouth 2 (two) times daily.         . pantoprazole (PROTONIX) 40 MG tablet   Oral   Take 1 tablet (40 mg total) by mouth daily.         . pravastatin (PRAVACHOL) 20 MG tablet   Oral   Take 20 mg by mouth daily.         Marland Kitchen tiotropium (SPIRIVA) 18 MCG inhalation capsule   Inhalation   Place 18 mcg into inhaler and inhale daily.         . vitamin B-12 (CYANOCOBALAMIN) 1000 MCG tablet   Oral   Take 1,000 mcg by mouth daily.         Marland Kitchen warfarin (COUMADIN) 1 MG tablet   Oral   Take 1 mg by mouth 2 (two) times a week. Tuesday and Thursday         . warfarin (COUMADIN) 2.5 MG tablet   Oral   Take 2.5 mg by mouth daily. All days except for Tuesday and Thursday           BP 197/103  Pulse 86  Temp(Src) 98.5 F (36.9 C) (Oral)  Resp 18  Ht 5\' 11"  (1.803 m)  Wt 221 lb (100.245 kg)  BMI 30.84 kg/m2  SpO2 96%  Physical Exam   Constitutional: He is oriented to person, place, and time. He appears well-developed and well-nourished. No distress.  HENT:  Head: Normocephalic and atraumatic.  Right Ear: External ear normal.  Left Ear: External ear normal.  Mouth/Throat: Oropharynx is clear and moist. No oropharyngeal exudate.  Eyes: Conjunctivae and EOM are normal. Right eye exhibits no discharge. Left eye exhibits no discharge.  Neck: Normal range of motion. Neck supple.  Cardiovascular: Normal rate, regular rhythm and normal heart sounds.   No murmur heard. Pulmonary/Chest: Effort normal and breath sounds normal. No respiratory distress. He has no wheezes. He has no rales.  Abdominal: Soft. Bowel sounds are normal.  Neurological: He is alert and oriented to person, place, and time. He exhibits normal muscle tone. Coordination normal.  Skin: Skin is warm and dry. No rash noted. He is not diaphoretic. No erythema.    ED Course  Procedures (including critical care time)  Labs Reviewed  CBC  BASIC METABOLIC PANEL   No results found.   No diagnosis found.    MDM  62 yo M with COPD and HTN presenting with hypertensive urgency.  No signs of end organ damage.   10:17 PM: RNs have been unable to obtain labs.  BP remains 180/100.  Patient reporting anxiety with teeth grinding.  States he has never been this anxious before.  Hx of paradoxical reaction to benzos.  Will give hydroxyzine 25mg  PO.  Will give PO clonidine 0.2mg  and monitor BP q15 minutes.  11:16 PM: Creatinine is okay.  Will increase cozaar to 50mg  BID, once dose prior to discharge.  Encouraged f/u with primary at [facility].  Phebe Colla, MD 01/27/13 [phone]

## 2013-01-27 NOTE — ED Notes (Signed)
Pt BIB EMS, from [facility], reports of increased hypertension after PRN Clonidine.  Pt reports dizziness for the past week, hx MI and PE, denies ShOB. Denies pain.

## 2013-01-27 NOTE — ED Notes (Signed)
Lab attempt x2 unsuccessful. Main lab to come draw

## 2013-01-27 NOTE — ED Notes (Signed)
YNW:[id number]<[id number]> Expected date:01/27/13<BR> Expected time: 8:04 PM<BR> Means of arrival:Ambulance<BR> Comments:<BR> Blood pressure problems

## 2013-01-27 NOTE — ED Notes (Signed)
X2 unsuccessful IV attempts, phlebotomy to attempt blood draw

## 2013-01-28 ENCOUNTER — Non-Acute Institutional Stay (SKILLED_NURSING_FACILITY): Payer: 59 | Admitting: Internal Medicine

## 2013-01-28 DIAGNOSIS — G20 Parkinson's disease: Secondary | ICD-10-CM

## 2013-01-28 DIAGNOSIS — G20A1 Parkinson's disease without dyskinesia, without mention of fluctuations: Secondary | ICD-10-CM

## 2013-01-28 DIAGNOSIS — F419 Anxiety disorder, unspecified: Secondary | ICD-10-CM

## 2013-01-28 DIAGNOSIS — T43591A Poisoning by other antipsychotics and neuroleptics, accidental (unintentional), initial encounter: Secondary | ICD-10-CM

## 2013-01-28 DIAGNOSIS — F0280 Dementia in other diseases classified elsewhere without behavioral disturbance: Secondary | ICD-10-CM

## 2013-01-28 DIAGNOSIS — G219 Secondary parkinsonism, unspecified: Secondary | ICD-10-CM

## 2013-01-28 DIAGNOSIS — G2111 Neuroleptic induced parkinsonism: Secondary | ICD-10-CM

## 2013-01-28 DIAGNOSIS — F411 Generalized anxiety disorder: Secondary | ICD-10-CM

## 2013-01-28 DIAGNOSIS — I10 Essential (primary) hypertension: Secondary | ICD-10-CM

## 2013-03-17 ENCOUNTER — Non-Acute Institutional Stay (SKILLED_NURSING_FACILITY): Payer: 59 | Admitting: Adult Health

## 2013-03-17 DIAGNOSIS — I2699 Other pulmonary embolism without acute cor pulmonale: Secondary | ICD-10-CM

## 2013-03-17 DIAGNOSIS — Z7901 Long term (current) use of anticoagulants: Secondary | ICD-10-CM

## 2013-03-20 ENCOUNTER — Other Ambulatory Visit: Payer: Self-pay | Admitting: *Deleted

## 2013-03-20 MED ORDER — HYDROCODONE-ACETAMINOPHEN 5-325 MG PO TABS: ORAL_TABLET | ORAL | Status: DC

## 2013-03-20 MED ORDER — LORAZEPAM 0.5 MG PO TABS: ORAL_TABLET | ORAL | Status: DC

## 2013-03-25 ENCOUNTER — Non-Acute Institutional Stay (SKILLED_NURSING_FACILITY): Payer: 59 | Admitting: Internal Medicine

## 2013-03-25 DIAGNOSIS — F411 Generalized anxiety disorder: Secondary | ICD-10-CM

## 2013-03-25 DIAGNOSIS — T43591A Poisoning by other antipsychotics and neuroleptics, accidental (unintentional), initial encounter: Secondary | ICD-10-CM

## 2013-03-25 DIAGNOSIS — F419 Anxiety disorder, unspecified: Secondary | ICD-10-CM

## 2013-03-25 DIAGNOSIS — G2111 Neuroleptic induced parkinsonism: Secondary | ICD-10-CM

## 2013-03-25 DIAGNOSIS — H6121 Impacted cerumen, right ear: Secondary | ICD-10-CM

## 2013-03-25 DIAGNOSIS — I10 Essential (primary) hypertension: Secondary | ICD-10-CM

## 2013-03-25 DIAGNOSIS — G219 Secondary parkinsonism, unspecified: Secondary | ICD-10-CM

## 2013-03-25 DIAGNOSIS — H6120 Impacted cerumen, unspecified ear: Secondary | ICD-10-CM

## 2013-03-25 NOTE — Progress Notes (Signed)
Patient ID: Derek Hicks, male   DOB: 07-16-1951, 62 y.o.   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  He c/o right ear discomfort.  He has not seen any discharge.  See ros  Review of Systems:  Review of Systems  Constitutional: Negative for fever, chills and malaise/fatigue.  HENT: Positive for ear pain. Negative for congestion, ear discharge, hearing loss and tinnitus.   Eyes: Negative for blurred vision.  Respiratory: Negative for shortness of breath.   Cardiovascular: Negative for chest pain.  Gastrointestinal: Negative for abdominal pain.  Genitourinary: Negative for dysuria.  Musculoskeletal: Negative for myalgias.  Skin: Negative for rash.  Neurological: Positive for tremors. Negative for loss of consciousness and headaches.  Endo/Heme/Allergies: Bruises/bleeds easily.  Psychiatric/Behavioral: Negative for memory loss. The patient is nervous/anxious.      Medications: Patient's Medications  New Prescriptions   ACETAMINOPHEN (TYLENOL 8 HOUR) 650 MG CR TABLET    Take 1 tablet (650 mg total) by mouth every 8 (eight) hours as needed for pain.   BLOOD GLUCOSE MONITORING SUPPL (FORACARE PREMIUM V10) DEVI    1 Device by Does not apply route daily. Use to monitor blood sugar. 250.00   DOCUSATE SODIUM (COLACE) 100 MG CAPSULE    Take 1 capsule (100 mg total) by mouth daily.   VILAZODONE HCL (VIIBRYD) 10 & 20 & 40 MG KIT    Take 10 mg by mouth daily.  Previous Medications   ALBUTEROL SULFATE (PROAIR HFA IN)    Inhale two puffs every 6 hours as needed for SOB   FLUTICASONE-SALMETEROL (ADVAIR DISKUS) 250-50 MCG/DOSE AEPB    Inhale one puff into lungs twice daily  Modified Medications   Modified Medication Previous Medication   AMBULATORY NON FORMULARY  MEDICATION AMBULATORY NON FORMULARY MEDICATION      Medication Name: Please discontinue blood sugar checks/ use of glucose monitor and supplies, patient's blood sugar is under control.    Medication Name: Please discontinue blood sugar checks/ use of glucose monitor and supplies, patient's blood sugar is under control.   BENZTROPINE (COGENTIN) 1 MG TABLET benztropine (COGENTIN) 1 MG tablet      Take 1 tablet (1 mg total) by mouth 2 (two) times daily.    Take 1 tablet (1 mg total) by mouth 2 (two) times daily.   CARBAMAZEPINE (TEGRETOL) 200 MG TABLET carbamazepine (TEGRETOL) 200 MG tablet      Take 1.5 tablets (300 mg total) by mouth 2 (two) times daily.    Take 1.5 tablets (300 mg total) by mouth 2 (two) times daily.   CARBIDOPA-LEVODOPA (SINEMET IR) 25-100 MG PER TABLET carbidopa-levodopa (SINEMET IR) 25-100 MG per tablet      Take 1 tablet by mouth 3 (three) times daily.    Take 1 tablet by mouth 3 (three) times daily.   FORA LANCETS MISC FORA LANCETS MISC      Use to check blood sugar M, W, F. 250.00    Use to check blood sugar once daily. 250.00   GLUCOSE BLOOD TEST STRIP glucose blood test strip      Use as instructed to test blood sugar M, W, F daily. 250.00    Use as instructed to test blood sugar once daily. 250.00   HYDROCHLOROTHIAZIDE (HYDRODIURIL) 25 MG TABLET hydrochlorothiazide (HYDRODIURIL) 25  MG tablet      Take 1 tablet (25 mg total) by mouth daily.    Take 1 tablet (25 mg total) by mouth daily.   LEVOTHYROXINE (SYNTHROID, LEVOTHROID) 75 MCG TABLET levothyroxine (SYNTHROID, LEVOTHROID) 75 MCG tablet      Take 1 tablet (75 mcg total) by mouth every morning.    Take 1 tablet (75 mcg total) by mouth every morning.   LOSARTAN (COZAAR) 100 MG TABLET losartan (COZAAR) 100 MG tablet      Take 1 tablet (100 mg total) by mouth daily.    Take 1 tablet (100 mg total) by mouth daily.   NEBIVOLOL (BYSTOLIC) 10 MG TABLET nebivolol (BYSTOLIC) 10 MG tablet      Take 1 tablet (10 mg total) by mouth  daily.    Take 1 tablet (10 mg total) by mouth daily.   OLANZAPINE (ZYPREXA) 5 MG TABLET OLANZapine (ZYPREXA) 7.5 MG tablet      Take 1 tablet (5 mg total) by mouth at bedtime.    Take 1 tablet (7.5 mg total) by mouth at bedtime.   PRAVASTATIN (PRAVACHOL) 20 MG TABLET pravastatin (PRAVACHOL) 20 MG tablet      Take 1 tablet (20 mg total) by mouth every evening.    Take 1 tablet (20 mg total) by mouth every evening.   RIVAROXABAN (XARELTO) 20 MG TABS TABLET Rivaroxaban (XARELTO) 20 MG TABS      Take 1 tablet (20 mg total) by mouth daily. Patient needs an appointment.    Take 1 tablet (20 mg total) by mouth daily.  Discontinued Medications   ACETAMINOPHEN (TYLENOL) 500 MG TABLET    Take 500 mg by mouth every 6 (six) hours as needed for pain. For pain/fever over 101.   ALBUTEROL (PROVENTIL HFA;VENTOLIN HFA) 108 (90 BASE) MCG/ACT INHALER    Inhale 2 puffs into the lungs every 6 (six) hours as needed. For shortness of breath   BENZTROPINE (COGENTIN) 1 MG TABLET    Take 1 mg by mouth 2 (two) times daily.   CARBIDOPA-LEVODOPA (SINEMET IR) 25-100 MG PER TABLET    Take 1 tablet by mouth 3 (three) times daily.   CLONIDINE (CATAPRES) 0.1 MG TABLET    Take 0.1 mg by mouth every 8 (eight) hours as needed (systolic BP>180).   FLUTICASONE-SALMETEROL (ADVAIR) 250-50 MCG/DOSE AEPB    Inhale 1 puff into the lungs 2 (two) times daily.   HYDROCODONE-ACETAMINOPHEN (NORCO/VICODIN) 5-325 MG PER TABLET    Take one tablet by mouth three times daily as needed for pain   LEVOTHYROXINE (SYNTHROID, LEVOTHROID) 75 MCG TABLET    Take 75 mcg by mouth every morning.    LORAZEPAM (ATIVAN) 0.5 MG TABLET    Take one tablet by mouth three times daily for anxiety   LOSARTAN (COZAAR) 50 MG TABLET    Take 1 tablet (50 mg total) by mouth 2 (two) times daily.   OLANZAPINE (ZYPREXA) 5 MG TABLET    Take 7.5 mg by mouth at bedtime.   OMEPRAZOLE (PRILOSEC) 20 MG CAPSULE    Take 20 mg by mouth every morning.   OXCARBAZEPINE (TRILEPTAL) 300 MG  TABLET    Take 300 mg by mouth 2 (two) times daily.   PRAVASTATIN (PRAVACHOL) 20 MG TABLET    Take 20 mg by mouth every evening.    TAMSULOSIN (FLOMAX) 0.4 MG CAPS    Take 0.4 mg by mouth at bedtime.   VITAMIN B-12 (CYANOCOBALAMIN) 1000 MCG TABLET    Take 1,000 mcg  by mouth every morning.    WARFARIN (COUMADIN) 1 MG TABLET    Take 3 mg by mouth every evening. Total daily dose 3.5mg    WARFARIN (COUMADIN) 2.5 MG TABLET    Take 2.5 mg by mouth every evening. Total daily dose 3.5mg    Physical Exam: Filed Vitals:   03/25/13 1609  BP: 112/85  Pulse: 83  Temp: 97 F (36.1 C)  Resp: 20  Physical Exam  Vitals reviewed. Constitutional: He is oriented to person, place, and time. He appears well-developed and well-nourished. No distress.  HENT:  Head: Normocephalic and atraumatic.  Right Ear: There is tenderness.  Left Ear: No drainage or tenderness. Tympanic membrane is not erythematous. No decreased hearing is noted.  Nose: Nose normal.  Mouth/Throat: Oropharynx is clear and moist. No oropharyngeal exudate.  Right cerumen impaction  Eyes: EOM are normal. Pupils are equal, round, and reactive to light.  Neck: Normal range of motion. Neck supple.  Cardiovascular: Normal rate, regular rhythm, normal heart sounds and intact distal pulses.   Pulmonary/Chest: Effort normal and breath sounds normal. No respiratory distress.  Abdominal: Soft. Bowel sounds are normal. He exhibits no distension.  Musculoskeletal: Normal range of motion.  Resting tremor  Lymphadenopathy:    He has no cervical adenopathy.  Neurological: He is alert and oriented to person, place, and time.  Skin: Skin is warm and dry.       Labs reviewed: Basic Metabolic Panel:  Recent Labs  16/10/96 0647 12/05/12 0622 12/07/12 0730 01/27/13 2239 04/18/13 2143  NA 139 139 137 130* 136  K 4.0 4.2 4.4 3.8 4.3  CL 108 107 102 96 100  CO2 25 25 26   --   --   GLUCOSE 113* 125* 134* 142* 124*  BUN 19 12 13 6 9   CREATININE  1.39* 1.09 1.00 0.80 1.00  CALCIUM 8.4 9.0 9.6  --   --     Liver Function Tests:  Recent Labs  12/03/12 0908 12/04/12 0647  AST 69* 33  ALT 14 5  ALKPHOS 43 39  BILITOT 0.6 0.5  PROT 6.9 5.4*  ALBUMIN 3.3* 2.5*    CBC:  Recent Labs  12/03/12 0908 12/04/12 0647 01/27/13 2239 04/18/13 2143  WBC 15.5* 10.7*  --   --   HGB 12.3* 9.7* 17.0 17.3*  HCT 37.8* 29.7* 50.0 51.0  MCV 90.4 91.1  --   --   PLT 347 301  --   --    Assessment/Plan 1. Cerumen impaction, right -orders written for debrox drops for a week, and if no improvement will need flushing   2. Neuroleptic-induced Parkinsonism -my impression is that he does NOT have parkinson's disease, but neuroleptic-induced parkinsonism -I am hesitant to d/c his sinemet, but may in time as I watch his symptoms  3. Anxiety -try to limit benzos due to reciprocal effect noted in his case  4. Hypertension -bps have been running high except today's--in the 160s over 70s  With pulse in 80s -increase cozaar to 75mg  po bid and cont to [facility] for orthostatic hypotension

## 2013-03-27 ENCOUNTER — Non-Acute Institutional Stay (SKILLED_NURSING_FACILITY): Payer: 59 | Admitting: Nurse Practitioner

## 2013-03-27 DIAGNOSIS — E785 Hyperlipidemia, unspecified: Secondary | ICD-10-CM | POA: Insufficient documentation

## 2013-03-27 DIAGNOSIS — Z7901 Long term (current) use of anticoagulants: Secondary | ICD-10-CM | POA: Insufficient documentation

## 2013-03-27 DIAGNOSIS — N400 Enlarged prostate without lower urinary tract symptoms: Secondary | ICD-10-CM | POA: Insufficient documentation

## 2013-03-27 DIAGNOSIS — H6120 Impacted cerumen, unspecified ear: Secondary | ICD-10-CM

## 2013-03-27 DIAGNOSIS — K219 Gastro-esophageal reflux disease without esophagitis: Secondary | ICD-10-CM | POA: Insufficient documentation

## 2013-03-27 DIAGNOSIS — E039 Hypothyroidism, unspecified: Secondary | ICD-10-CM | POA: Insufficient documentation

## 2013-03-27 NOTE — Assessment & Plan Note (Signed)
Will continue zocor 20 mg daily  

## 2013-03-27 NOTE — Assessment & Plan Note (Signed)
He is stable will continue zyprexa 7.5 mg nighlty cogentin 1 mg twice daily tegretol 300 mg twice daily

## 2013-03-27 NOTE — Assessment & Plan Note (Signed)
Will continue prilosec 20 mg daily  

## 2013-03-27 NOTE — Progress Notes (Signed)
Patient ID: Derek Hicks, male   DOB: 1951/08/06, 62 y.o.   MRN: [id number]  Nursing Home Location:  [facility] [place]   Place of Service: SNF [phone])   Chief Complaint: follow up ears feeling full and painful  HPI:  Pt was seen by Dr Renato Gails 2 days ago due to painful ears per patient- flushes were ordered and nursing staff needed clarification. Per staff and pt bilateral ears were flushed times one. Pt reports his ears are no long painful and do not feel full.   Review of Systems:  Review of Systems  Constitutional: Negative for fever and chills.  HENT: Negative for hearing loss, ear pain and tinnitus.      Medications: Patient's Medications  New Prescriptions   No medications on file  Previous Medications   ACETAMINOPHEN (TYLENOL) 500 MG TABLET    Take 500 mg by mouth every 6 (six) hours as needed for pain. For pain/fever over 101.   ALBUTEROL (PROVENTIL HFA;VENTOLIN HFA) 108 (90 BASE) MCG/ACT INHALER    Inhale 2 puffs into the lungs every 6 (six) hours as needed. For shortness of breath   BENZTROPINE (COGENTIN) 1 MG TABLET    Take 1 mg by mouth 2 (two) times daily.   CARBIDOPA-LEVODOPA (SINEMET IR) 25-100 MG PER TABLET    Take 1 tablet by mouth 3 (three) times daily.   CLONIDINE (CATAPRES) 0.1 MG TABLET    Take 0.1 mg by mouth every 8 (eight) hours as needed (systolic BP>180).   FLUTICASONE-SALMETEROL (ADVAIR) 250-50 MCG/DOSE AEPB    Inhale 1 puff into the lungs 2 (two) times daily.   HYDROCODONE-ACETAMINOPHEN (NORCO/VICODIN) 5-325 MG PER TABLET    Take one tablet by mouth three times daily as needed for pain   LEVOTHYROXINE (SYNTHROID, LEVOTHROID) 75 MCG TABLET    Take 75 mcg by mouth every morning.    LORAZEPAM (ATIVAN) 0.5 MG TABLET    Take one tablet by mouth three times daily for anxiety   LOSARTAN (COZAAR) 50 MG TABLET    Take 1 tablet (50 mg total) by mouth 2 (two) times daily.   OLANZAPINE (ZYPREXA) 5 MG TABLET    Take 7.5 mg by mouth at bedtime.   OMEPRAZOLE  (PRILOSEC) 20 MG CAPSULE    Take 20 mg by mouth every morning.   OXCARBAZEPINE (TRILEPTAL) 300 MG TABLET    Take 300 mg by mouth 2 (two) times daily.   PRAVASTATIN (PRAVACHOL) 20 MG TABLET    Take 20 mg by mouth every evening.    TAMSULOSIN (FLOMAX) 0.4 MG CAPS    Take 0.4 mg by mouth at bedtime.   TIOTROPIUM (SPIRIVA) 18 MCG INHALATION CAPSULE    Place 18 mcg into inhaler and inhale every morning.    VITAMIN B-12 (CYANOCOBALAMIN) 1000 MCG TABLET    Take 1,000 mcg by mouth every morning.    WARFARIN (COUMADIN) 1 MG TABLET    Take 1 mg by mouth every evening. Total daily dose 3.5mg    WARFARIN (COUMADIN) 2.5 MG TABLET    Take 2.5 mg by mouth every evening. Total daily dose 3.5mg   Modified Medications   No medications on file  Discontinued Medications   No medications on file     Physical Exam:  Filed Vitals:   03/27/13 1237  BP: 138/82  Pulse: 84  Temp: 98.4 F (36.9 C)  Resp: 20    Physical Exam  Constitutional: He is well-developed, well-nourished, and in no distress. No distress.  HENT:  Right  Ear: Hearing, tympanic membrane, external ear and ear canal normal.  Left Ear: Hearing, tympanic membrane, external ear and ear canal normal.  Min wax in bilateral ear canel. TM easily visualized   Skin: He is not diaphoretic.      Assessment/Plan cerumen impaction- resolved- will dc washes

## 2013-03-27 NOTE — Assessment & Plan Note (Signed)
Will continue flomax daily will stop the spiriva daily due to his poor ability to void and will get ua/c&s to ensure that he does not have an infection present

## 2013-03-27 NOTE — Progress Notes (Signed)
Patient ID: Derek Hicks, male   DOB: 1951-10-10, 62 y.o.   MRN: [id number]  [facility]  Allergies  Allergen Reactions  . Benzodiazepines Other (See Comments)    Causes aggression, paradoxical reaction  . Other     Anti-anxiety medications cause agitation  . Wellbutrin (Bupropion) Other (See Comments)    Reaction unknown     Chief Complaint  Patient presents with  . Medical Managment of Chronic Issues    HPI: He is being seen for the management of his chronic illnesses; his blood pressure is extremely high and will require immediate treatment. His inr is 1.83; and he will require adjustment of his coumadin for his PE. He is complaining of an inability to urinate. He is taking spiriva which can cause this issue.   Past Medical History  Diagnosis Date  . COPD (chronic obstructive pulmonary disease)   . Pulmonary embolus   . Hypertension   . Bipolar 1 disorder   . Neuroleptic-induced Parkinsonism   . Anxiety   . Hyperlipidemia   . Falls infrequently   . Current use of long term anticoagulation   . Hypothyroidism   . Shortness of breath   . MI (myocardial infarction) 2006    Past Surgical History  Procedure Laterality Date  . Rotator cuff repair      x 2     VITAL SIGNS BP 220/100  Pulse 78  Ht 5\' 6"  (1.676 m)  Wt 221 lb (100.245 kg)  BMI 35.69 kg/m2   Patient's Medications  New Prescriptions   No medications on file  Previous Medications   ACETAMINOPHEN (TYLENOL) 500 MG TABLET    Take 500 mg by mouth every 6 (six) hours as needed for pain. For pain/fever over 101.   ALBUTEROL (PROVENTIL HFA;VENTOLIN HFA) 108 (90 BASE) MCG/ACT INHALER    Inhale 2 puffs into the lungs every 6 (six) hours as needed. For shortness of breath   BENZTROPINE (COGENTIN) 1 MG TABLET    Take 1 mg by mouth 2 (two) times daily.   CARBIDOPA-LEVODOPA (SINEMET IR) 25-100 MG PER TABLET    Take 1 tablet by mouth 3 (three) times daily.   CLONIDINE (CATAPRES) 0.1 MG TABLET    Take 0.1 mg by mouth  every 8 (eight) hours as needed (systolic BP>180).   FLUTICASONE-SALMETEROL (ADVAIR) 250-50 MCG/DOSE AEPB    Inhale 1 puff into the lungs 2 (two) times daily.   LEVOTHYROXINE (SYNTHROID, LEVOTHROID) 75 MCG TABLET    Take 75 mcg by mouth every morning.    OMEPRAZOLE (PRILOSEC) 20 MG CAPSULE    Take 20 mg by mouth every morning.   OXCARBAZEPINE (TRILEPTAL) 300 MG TABLET    Take 300 mg by mouth 2 (two) times daily.   SIMVASTATIN (ZOCOR) 20 MG TABLET    Take 20 mg by mouth every evening.   TAMSULOSIN (FLOMAX) 0.4 MG CAPS    Take 0.4 mg by mouth at bedtime.   VITAMIN B-12 (CYANOCOBALAMIN) 1000 MCG TABLET    Take 1,000 mcg by mouth every morning.    WARFARIN (COUMADIN) 1 MG TABLET    Take 1 mg by mouth every evening. Total daily dose 3.5mg    WARFARIN (COUMADIN) 2.5 MG TABLET    Take 2.5 mg by mouth every evening. Total daily dose 3.5mg   Modified Medications   Modified Medication Previous Medication   HYDROCODONE-ACETAMINOPHEN (NORCO/VICODIN) 5-325 MG PER TABLET HYDROcodone-acetaminophen (NORCO/VICODIN) 5-325 MG per tablet      Take one tablet by mouth three times daily  as needed for pain    Take 1 tablet by mouth every morning.   LORAZEPAM (ATIVAN) 0.5 MG TABLET LORazepam (ATIVAN) 0.5 MG tablet      Take one tablet by mouth three times daily for anxiety    Take 0.5 mg by mouth every 8 (eight) hours as needed for anxiety.   LOSARTAN (COZAAR) 50 MG TABLET losartan (COZAAR) 50 MG tablet      Take 1 tablet (50 mg total) by mouth 2 (two) times daily.    Take 50 mg by mouth every morning.   OLANZAPINE (ZYPREXA) 5 MG TABLET OLANZapine (ZYPREXA) 5 MG tablet      Take 7.5 mg by mouth at bedtime.    Take 2 tabs on 12/09/12, 1 tab for 4 days (2/26-3/1), then a half-tab for 4 days (3/2-3/5), then stop this medication  Discontinued Medications   HYDROCODONE-ACETAMINOPHEN (NORCO/VICODIN) 5-325 MG PER TABLET    Take 1 tablet by mouth 2 (two) times daily as needed.   PANTOPRAZOLE (PROTONIX) 40 MG TABLET    Take 1  tablet (40 mg total) by mouth daily.   PRAVASTATIN (PRAVACHOL) 20 MG TABLET    Take 20 mg by mouth every evening.    TIOTROPIUM (SPIRIVA) 18 MCG INHALATION CAPSULE    Place 18 mcg into inhaler and inhale every morning.     SIGNIFICANT DIAGNOSTIC EXAMS  12/05/12:  CXR:  low lung volumes with bibasilar atelectasis and potential small b/l pleural effusions 12/03/12:  CXR low lung volumes with cardiomegaly, vascular congestion, and bibasilar atelectasis 12/03/12:  xrays pelvis:  no acute bony abnormality 12/03/12:  right hip:  marked soft tissue swelling over right knee anteriorly, no acute bony abnormality 12/03/12:  CT head/maxillofacial/neck w/o contrast:  atrophy and small vessel disease, right facial hematoma, no maxillofacial fx, hematoma overlies right zygomatic arch, normal alignment with DDD at C5-6, no acute cspine fx 12/03/12:  left knee xrays:  no acute bony abn 12/03/12:  right knee xrays: anterior soft tissue swelling, no acute bony abn   LABS REVIEWED:   12/03/12:  ast 69, alt 14, ap 43, alb 3.3, wbc 15.5, h/h 12.3/37,8, plts 347 12/05/12:  TSH 1.287, lipids:  tc 159, tg 200, hdl 38, ldl 81 12/07/12:  Na 137, K 4.4, BUN 13, cr 1, Ca 9.6, alb 2.5, wbc 10.7, h/h 9.7/29.7, plts 30 01-07-13: wbc 5.9; hgb 13.9; hct 41.;7 mcv 87.6; plt 252; glucose 98; bun 8; creat 0.89 k+ 3.8 Na++ 136; liver normal albumin 3.7; chol 176; ldl 95; trig 209; tsh 1.465; hgb a1c 5.5   Review of Systems  Constitutional: Negative for malaise/fatigue.  Respiratory: Negative for cough and shortness of breath.   Cardiovascular: Negative for chest pain and leg swelling.  Gastrointestinal: Negative for heartburn and constipation.  Genitourinary: Positive for dysuria.       Has difficulty with voiding  Musculoskeletal: Negative for myalgias and joint pain.  Skin: Negative.   Neurological: Negative for headaches.  Psychiatric/Behavioral: Negative for depression. The patient is not nervous/anxious and does not have  insomnia.     Physical Exam  Constitutional: He appears well-developed and well-nourished.  overweight  Neck: Neck supple. No JVD present. No thyromegaly present.  Cardiovascular: Normal rate, regular rhythm and intact distal pulses.   Respiratory: Effort normal and breath sounds normal. No respiratory distress.  GI: Soft. Bowel sounds are normal. He exhibits no distension. There is no tenderness.  Musculoskeletal: He exhibits no edema.  Neurological: He is alert.  Skin: Skin is  warm and dry.  Psychiatric: He has a normal mood and affect.     ASSESSMENT/ PLAN:  Bipolar disorder He is stable will continue zyprexa 7.5 mg nighlty cogentin 1 mg twice daily tegretol 300 mg twice daily  COPD (chronic obstructive pulmonary disease) Will stop his spiriva due to his poor ability to urinate will continue advair 25/50 twice daily and albuterol 2 puffs every 6 hours as needed   Neuroleptic-induced Parkinsonism without change in status will continue sinemet 25/100 mg three times daily and will monitor  Pulmonary embolism Will continue coumadin therapy   Dyslipidemia Will continue zocor 20 mg daily  GERD (gastroesophageal reflux disease) Will continue prilosec 20 mg daily  Unspecified hypothyroidism Will continue synthroid 75 mcg daily   BPH (benign prostatic hyperplasia) Will continue flomax daily will stop the spiriva daily due to his poor ability to void and will get ua/c&s to ensure that he does not have an infection present   Hypertension His status is worse with b/p of 220/100 will being cozaar 50 mg daily and will being clonidine 0.1 mg every 6 hours as needed for s  B/p >=180; will have nursing check blood pressure every shift and will check bmp in 2 weeks.   Long term (current) use of anticoagulants For his inr f 1.83 will being coumadin 3.5 mg daily and will check inr in one week    Time spent with patient 50 minutes

## 2013-03-27 NOTE — Assessment & Plan Note (Signed)
His status is worse with b/p of 220/100 will being cozaar 50 mg daily and will being clonidine 0.1 mg every 6 hours as needed for s  B/p >=180; will have nursing check blood pressure every shift and will check bmp in 2 weeks.

## 2013-03-27 NOTE — Assessment & Plan Note (Signed)
Will continue coumadin therapy

## 2013-03-27 NOTE — Assessment & Plan Note (Signed)
For his inr f 1.83 will being coumadin 3.5 mg daily and will check inr in one week

## 2013-03-27 NOTE — Assessment & Plan Note (Signed)
without change in status will continue sinemet 25/100 mg three times daily and will monitor

## 2013-03-27 NOTE — Assessment & Plan Note (Signed)
Will continue synthroid 75 mcg daily

## 2013-03-27 NOTE — Assessment & Plan Note (Signed)
Will stop his spiriva due to his poor ability to urinate will continue advair 25/50 twice daily and albuterol 2 puffs every 6 hours as needed

## 2013-04-05 ENCOUNTER — Encounter: Payer: Self-pay | Admitting: Internal Medicine

## 2013-04-05 DIAGNOSIS — F419 Anxiety disorder, unspecified: Secondary | ICD-10-CM | POA: Insufficient documentation

## 2013-04-05 NOTE — Assessment & Plan Note (Signed)
Pt frequently complains of anxiety, but does not truly appear anxious.  He does grind his teeth at night, and might benefit from seeing a dentist about this.

## 2013-04-05 NOTE — Assessment & Plan Note (Signed)
Pt has been on psychotropics for many years and now is on cogentin for this.  Stable.  I question his need for sinemet and his diagnosis of dementia.

## 2013-04-05 NOTE — Assessment & Plan Note (Signed)
BP had been acutely elevated, but does remain above goal at here at facility.  Was officially started on cozaar 50mg  po bid.  Bp to be monitored and we are to be informed if it is staying over 150/90 three times.

## 2013-04-05 NOTE — Progress Notes (Signed)
Patient ID: Derek Hicks, male   DOB: 1951-06-02, 62 y.o.   MRN: [id number] Location: Location:  [facility] long term [facility]  Provider:  Jesiah Yerby L. Renato Hicks, D.O., C.M.D.  Chief Complaint: f/u for ED visit with hypertension and dizziness  HPI:   62 yo male with h/o bipolar disorder, COPD, neuroleptic-induced Parkinsonism, anxiety, htn and hyperlipidemia was seen in f/u for an ED visit for hypertension and dizziness.  BP was as high as 193/103 and had not previously been running high at the facility.  He had no evidence of end-organ damage during the ED visit.  He was given clonidine 0.2mg  and later 0.1mg  and also given hydroxyzine which probably made him dizzy.  He was advised to start cozaar 50mg  po bid, but this had not yet been initiated here when I saw him, and his bps remained over 150/90, but not in the emergency range.  He was feeling better.  He is chronically anxious.        Review of Systems:  Review of Systems  Constitutional: Negative for fever, chills and malaise/fatigue.  Eyes: Negative for blurred vision.  Respiratory: Negative for shortness of breath.   Cardiovascular: Negative for chest pain, orthopnea and leg swelling.  Gastrointestinal: Negative for heartburn.  Musculoskeletal: Negative for myalgias.  Skin: Negative for rash.  Neurological: Negative for dizziness, weakness and headaches.  Endo/Heme/Allergies: Does not bruise/bleed easily.  Psychiatric/Behavioral: The patient is nervous/anxious.      Medications: Patient's Medications  New Prescriptions   No medications on file  Previous Medications   ACETAMINOPHEN (TYLENOL) 500 MG TABLET    Take 500 mg by mouth every 6 (six) hours as needed for pain. For pain/fever over 101.   ALBUTEROL (PROVENTIL HFA;VENTOLIN HFA) 108 (90 BASE) MCG/ACT INHALER    Inhale 2 puffs into the lungs every 6 (six) hours as needed. For shortness of breath   BENZTROPINE (COGENTIN) 1 MG TABLET    Take 1 mg by mouth 2 (two) times  daily.   CARBIDOPA-LEVODOPA (SINEMET IR) 25-100 MG PER TABLET    Take 1 tablet by mouth 3 (three) times daily.   CLONIDINE (CATAPRES) 0.1 MG TABLET    Take 0.1 mg by mouth every 8 (eight) hours as needed (systolic BP>180).   FLUTICASONE-SALMETEROL (ADVAIR) 250-50 MCG/DOSE AEPB    Inhale 1 puff into the lungs 2 (two) times daily.   LEVOTHYROXINE (SYNTHROID, LEVOTHROID) 75 MCG TABLET    Take 75 mcg by mouth every morning.    LOSARTAN (COZAAR) 50 MG TABLET    Take 1 tablet (50 mg total) by mouth 2 (two) times daily.   OLANZAPINE (ZYPREXA) 5 MG TABLET    Take 7.5 mg by mouth at bedtime.   OMEPRAZOLE (PRILOSEC) 20 MG CAPSULE    Take 20 mg by mouth every morning.   OXCARBAZEPINE (TRILEPTAL) 300 MG TABLET    Take 300 mg by mouth 2 (two) times daily.   SIMVASTATIN (ZOCOR) 20 MG TABLET    Take 20 mg by mouth every evening.   TAMSULOSIN (FLOMAX) 0.4 MG CAPS    Take 0.4 mg by mouth at bedtime.   VITAMIN B-12 (CYANOCOBALAMIN) 1000 MCG TABLET    Take 1,000 mcg by mouth every morning.    WARFARIN (COUMADIN) 1 MG TABLET    Take 1 mg by mouth every evening. Total daily dose 3.5mg    WARFARIN (COUMADIN) 2.5 MG TABLET    Take 2.5 mg by mouth every evening. Total daily dose 3.5mg   Modified Medications  Modified Medication Previous Medication   HYDROCODONE-ACETAMINOPHEN (NORCO/VICODIN) 5-325 MG PER TABLET HYDROcodone-acetaminophen (NORCO/VICODIN) 5-325 MG per tablet      Take one tablet by mouth three times daily as needed for pain    Take 1 tablet by mouth every morning.   LORAZEPAM (ATIVAN) 0.5 MG TABLET LORazepam (ATIVAN) 0.5 MG tablet      Take one tablet by mouth three times daily for anxiety    Take 0.5 mg by mouth every 8 (eight) hours as needed for anxiety.  Discontinued Medications   PRAVASTATIN (PRAVACHOL) 20 MG TABLET    Take 20 mg by mouth every evening.     Physical Exam: There were no vitals filed for this visit. Physical Exam  Nursing note and vitals reviewed. Constitutional: He is oriented to  person, place, and time. He appears well-developed and well-nourished. No distress.  HENT:  Head: Normocephalic and atraumatic.  Eyes: EOM are normal. Pupils are equal, round, and reactive to light.  Cardiovascular: Normal rate, regular rhythm, normal heart sounds and intact distal pulses.   Pulmonary/Chest: Effort normal and breath sounds normal.  Abdominal: Soft. Bowel sounds are normal.  Neurological: He is alert and oriented to person, place, and time.  Skin: Skin is warm and dry. No rash noted.     Labs reviewed: Basic Metabolic Panel:  Recent Labs  45/40/98 0647 12/05/12 0622 12/07/12 0730 01/27/13 2239  NA 139 139 137 130*  K 4.0 4.2 4.4 3.8  CL 108 107 102 96  CO2 25 25 26   --   GLUCOSE 113* 125* 134* 142*  BUN 19 12 13 6   CREATININE 1.39* 1.09 1.00 0.80  CALCIUM 8.4 9.0 9.6  --     Liver Function Tests:  Recent Labs  08/04/12 1540 12/03/12 0908 12/04/12 0647  AST 17 69* 33  ALT 23 14 5   ALKPHOS 50 43 39  BILITOT 0.3 0.6 0.5  PROT 7.3 6.9 5.4*  ALBUMIN 3.9 3.3* 2.5*    CBC:  Recent Labs  07/15/12 0040 08/04/12 1540 12/03/12 0908 12/04/12 0647 01/27/13 2239  WBC 8.8 8.9 15.5* 10.7*  --   NEUTROABS 5.3 6.3  --   --   --   HGB 15.2 14.9 12.3* 9.7* 17.0  HCT 44.2 44.6 37.8* 29.7* 50.0  MCV 84.5 84.8 90.4 91.1  --   PLT 298 341 347 301  --    Labs reviewed from ED visit.    Assessment/Plan Dementia in Parkinson's disease I don't think [name] has any dementia or Parkinson's disease, only neuroleptic-induced parkinsonism.  I am unsure that he benefits from sinemet.  Hypertension BP had been acutely elevated, but does remain above goal at here at facility.  Was officially started on cozaar 50mg  po bid.  Bp to be monitored and we are to be informed if it is staying over 150/90 three times.  Neuroleptic-induced Parkinsonism Pt has been on psychotropics for many years and now is on cogentin for this.  Stable.  I question his need for sinemet and  his diagnosis of dementia.    Anxiety Pt frequently complains of anxiety, but does not truly appear anxious.  He does grind his teeth at night, and might benefit from seeing a dentist about this.    Family/ staff Communication: discussed his care with nursing staff  Labs/tests ordered:  BP monitoring only

## 2013-04-05 NOTE — Assessment & Plan Note (Addendum)
I don't think [name] has any dementia or Parkinson's disease, only neuroleptic-induced parkinsonism.  I am unsure that he benefits from sinemet.

## 2013-04-07 ENCOUNTER — Encounter: Payer: Self-pay | Admitting: Adult Health

## 2013-04-07 DIAGNOSIS — I10 Essential (primary) hypertension: Secondary | ICD-10-CM

## 2013-04-07 NOTE — Assessment & Plan Note (Signed)
His hypertension is worse; will increase his cozaar to 100 mg daily will continue his prn clonidine and will continue to monitor his status he will require follow up once he is discharged to the group home.

## 2013-04-18 ENCOUNTER — Emergency Department (HOSPITAL_COMMUNITY)
Admission: EM | Admit: 2013-04-18 | Discharge: 2013-04-18 | Disposition: A | Discharge status: Home / Self Care | Payer: Medicare Other | Attending: Emergency Medicine | Admitting: Emergency Medicine

## 2013-04-18 ENCOUNTER — Encounter (HOSPITAL_COMMUNITY): Payer: Self-pay | Admitting: Emergency Medicine

## 2013-04-18 ENCOUNTER — Emergency Department (HOSPITAL_COMMUNITY): Payer: Medicare Other

## 2013-04-18 DIAGNOSIS — E785 Hyperlipidemia, unspecified: Secondary | ICD-10-CM | POA: Insufficient documentation

## 2013-04-18 DIAGNOSIS — I10 Essential (primary) hypertension: Secondary | ICD-10-CM | POA: Insufficient documentation

## 2013-04-18 DIAGNOSIS — IMO0002 Reserved for concepts with insufficient information to code with codable children: Secondary | ICD-10-CM | POA: Insufficient documentation

## 2013-04-18 DIAGNOSIS — R296 Repeated falls: Secondary | ICD-10-CM | POA: Insufficient documentation

## 2013-04-18 DIAGNOSIS — Z7901 Long term (current) use of anticoagulants: Secondary | ICD-10-CM | POA: Insufficient documentation

## 2013-04-18 DIAGNOSIS — S80212A Abrasion, left knee, initial encounter: Secondary | ICD-10-CM

## 2013-04-18 DIAGNOSIS — I252 Old myocardial infarction: Secondary | ICD-10-CM | POA: Insufficient documentation

## 2013-04-18 DIAGNOSIS — Z9181 History of falling: Secondary | ICD-10-CM | POA: Insufficient documentation

## 2013-04-18 DIAGNOSIS — F411 Generalized anxiety disorder: Secondary | ICD-10-CM | POA: Insufficient documentation

## 2013-04-18 DIAGNOSIS — F319 Bipolar disorder, unspecified: Secondary | ICD-10-CM | POA: Insufficient documentation

## 2013-04-18 DIAGNOSIS — Z87891 Personal history of nicotine dependence: Secondary | ICD-10-CM | POA: Insufficient documentation

## 2013-04-18 DIAGNOSIS — E039 Hypothyroidism, unspecified: Secondary | ICD-10-CM | POA: Insufficient documentation

## 2013-04-18 DIAGNOSIS — W19XXXA Unspecified fall, initial encounter: Secondary | ICD-10-CM

## 2013-04-18 DIAGNOSIS — J449 Chronic obstructive pulmonary disease, unspecified: Secondary | ICD-10-CM | POA: Insufficient documentation

## 2013-04-18 DIAGNOSIS — Y9301 Activity, walking, marching and hiking: Secondary | ICD-10-CM | POA: Insufficient documentation

## 2013-04-18 DIAGNOSIS — Y9210 Unspecified residential institution as the place of occurrence of the external cause: Secondary | ICD-10-CM | POA: Insufficient documentation

## 2013-04-18 DIAGNOSIS — J4489 Other specified chronic obstructive pulmonary disease: Secondary | ICD-10-CM | POA: Insufficient documentation

## 2013-04-18 DIAGNOSIS — G219 Secondary parkinsonism, unspecified: Secondary | ICD-10-CM | POA: Insufficient documentation

## 2013-04-18 DIAGNOSIS — Z888 Allergy status to other drugs, medicaments and biological substances status: Secondary | ICD-10-CM | POA: Insufficient documentation

## 2013-04-18 DIAGNOSIS — Z86711 Personal history of pulmonary embolism: Secondary | ICD-10-CM | POA: Insufficient documentation

## 2013-04-18 DIAGNOSIS — Z79899 Other long term (current) drug therapy: Secondary | ICD-10-CM | POA: Insufficient documentation

## 2013-04-18 LAB — POCT I-STAT, CHEM 8
BUN: 9 mg/dL (ref 6–23)
Chloride: 100 mEq/L (ref 96–112)
Creatinine, Ser: 1 mg/dL (ref 0.50–1.35)
Sodium: 136 mEq/L (ref 135–145)

## 2013-04-18 NOTE — ED Notes (Signed)
Pt. Lost balanced and fell at home this evening , no loc/ambulatory , reports pain at left knee , alert and oriented , respirations unlabored.

## 2013-04-18 NOTE — ED Provider Notes (Signed)
History    CSN: [id number] Arrival date & time 04/18/13  1922  First MD Initiated Contact with Patient 04/18/13 2001     Chief Complaint  Patient presents with  . Fall   (Consider location/radiation/quality/duration/timing/severity/associated sxs/prior Treatment) HPI Comments: Patient presents after a fall at a group home, injuring his knee.  He has a history of Bipolar, neuroleptic induced parkinsonian syndrome.  He says that he became off-balance while he was walking and fell forward causing an abrasion to his left knee.  He denies other injuries.  He has a history of htn as well that seems to be well-controlled.  He was recently taken into this new group home from [facility] and he tells me he was supposed to have his blood pressure checked on a daily basis but this was not done.   Patient is a 62 y.o. male presenting with fall. The history is provided by the patient.  Fall This is a new problem. The current episode started 1 to 2 hours ago. The problem occurs constantly. The problem has not changed since onset.The symptoms are aggravated by walking. Nothing relieves the symptoms. He has tried nothing for the symptoms. The treatment provided no relief.   Past Medical History  Diagnosis Date  . COPD (chronic obstructive pulmonary disease)   . Pulmonary embolus   . Hypertension   . Bipolar 1 disorder   . Neuroleptic-induced Parkinsonism   . Anxiety   . Hyperlipidemia   . Falls infrequently   . Current use of long term anticoagulation   . Hypothyroidism   . Shortness of breath   . MI (myocardial infarction) 2006   Past Surgical History  Procedure Laterality Date  . Rotator cuff repair      x 2    Family History  Problem Relation Age of Onset  . Aneurysm Mother   . Bipolar disorder Mother   . Other      No family history of DVT or PE   History  Substance Use Topics  . Smoking status: Former Smoker -- 20 years    Quit date: 10/15/2002  . Smokeless tobacco: Never  Used  . Alcohol Use: No    Review of Systems  All other systems reviewed and are negative.    Allergies  Benzodiazepines; Other; and Wellbutrin  Home Medications   Current Outpatient Rx  Name  Route  Sig  Dispense  Refill  . albuterol (PROVENTIL HFA;VENTOLIN HFA) 108 (90 BASE) MCG/ACT inhaler   Inhalation   Inhale 2 puffs into the lungs every 6 (six) hours as needed. For shortness of breath         . benztropine (COGENTIN) 1 MG tablet   Oral   Take 1 mg by mouth 2 (two) times daily.         . carbamazepine (TEGRETOL) 200 MG tablet   Oral   Take 300 mg by mouth 2 (two) times daily.         . carbidopa-levodopa (SINEMET IR) 25-100 MG per tablet   Oral   Take 1 tablet by mouth 3 (three) times daily.         . Fluticasone-Salmeterol (ADVAIR) 250-50 MCG/DOSE AEPB   Inhalation   Inhale 1 puff into the lungs 2 (two) times daily.         Marland Kitchen HYDROcodone-acetaminophen (NORCO/VICODIN) 5-325 MG per tablet      Take one tablet by mouth three times daily as needed for pain   90 tablet  0   . levothyroxine (SYNTHROID, LEVOTHROID) 75 MCG tablet   Oral   Take 75 mcg by mouth every morning.          Marland Kitchen LORazepam (ATIVAN) 0.5 MG tablet   Oral   Take 0.5 mg by mouth 3 (three) times daily. Take one tablet by mouth three times daily for anxiety         . losartan (COZAAR) 100 MG tablet   Oral   Take 100 mg by mouth daily.         Marland Kitchen OLANZapine (ZYPREXA) 5 MG tablet   Oral   Take 7.5 mg by mouth at bedtime.         Marland Kitchen omeprazole (PRILOSEC) 20 MG capsule   Oral   Take 20 mg by mouth every morning.         . pravastatin (PRAVACHOL) 20 MG tablet   Oral   Take 20 mg by mouth every evening.         . tamsulosin (FLOMAX) 0.4 MG CAPS   Oral   Take 0.4 mg by mouth at bedtime.         Marland Kitchen warfarin (COUMADIN) 3 MG tablet   Oral   Take 3 mg by mouth every evening.          BP 153/127  Pulse 92  Temp(Src) 99.2 F (37.3 C) (Oral)  Resp 14  SpO2  96% Physical Exam  Nursing note and vitals reviewed. Constitutional: He is oriented to person, place, and time. He appears well-developed and well-nourished. No distress.  HENT:  Head: Normocephalic and atraumatic.  Mouth/Throat: Oropharynx is clear and moist.  Neck: Normal range of motion. Neck supple.  Musculoskeletal:  The left knee is noted to have an abrasion to the anterior patella area.  It measures about 3 cm and there is no active bleeding.  There is no effusion and range of motion is normal.  There is no ap or lateral laxity.  No crepitus.  Neurological: He is alert and oriented to person, place, and time.  Skin: Skin is warm and dry. He is not diaphoretic.    ED Course  Procedures (including critical care time) Labs Reviewed - No data to display Dg Knee Complete 4 Views Left  04/18/2013   *RADIOLOGY REPORT*  Clinical Data: Anterior left knee pain post fall  LEFT KNEE - COMPLETE 4+ VIEW  Comparison: 12/03/2012  Findings: Mild osseous demineralization. Joint spaces preserved. No acute fracture, dislocation, or bone destruction. No knee joint effusion. Minimal scattered atherosclerotic calcification.  IMPRESSION: No acute osseous abnormalities.   Original Report Authenticated By: Ulyses Southward, M.D.   No diagnosis found.  MDM  The patient presents here after a fall due to him becoming "unsteady".  He has a history of bipolar and parkinsonian syndrome due to neuroleptic medications.  The xrays are negative and the istat chem 8 is unremarkable.  He is up ambulating in the ED without any problems and I believe he is stable for discharge.  He needs to follow up with his pcp to discuss his medications.  Geoffery Lyons, MD 04/18/13 2153

## 2013-04-20 ENCOUNTER — Ambulatory Visit (INDEPENDENT_AMBULATORY_CARE_PROVIDER_SITE_OTHER): Payer: 59 | Admitting: Internal Medicine

## 2013-04-20 ENCOUNTER — Encounter: Payer: Self-pay | Admitting: Internal Medicine

## 2013-04-20 VITALS — BP 160/90 | HR 96 | Temp 98.1°F | Resp 18 | Ht 68.11 in | Wt 230.0 lb

## 2013-04-20 DIAGNOSIS — I2699 Other pulmonary embolism without acute cor pulmonale: Secondary | ICD-10-CM

## 2013-04-20 DIAGNOSIS — W19XXXA Unspecified fall, initial encounter: Secondary | ICD-10-CM

## 2013-04-20 DIAGNOSIS — F319 Bipolar disorder, unspecified: Secondary | ICD-10-CM

## 2013-04-20 DIAGNOSIS — T43591A Poisoning by other antipsychotics and neuroleptics, accidental (unintentional), initial encounter: Secondary | ICD-10-CM

## 2013-04-20 DIAGNOSIS — R569 Unspecified convulsions: Secondary | ICD-10-CM

## 2013-04-20 DIAGNOSIS — N400 Enlarged prostate without lower urinary tract symptoms: Secondary | ICD-10-CM

## 2013-04-20 DIAGNOSIS — G219 Secondary parkinsonism, unspecified: Secondary | ICD-10-CM

## 2013-04-20 DIAGNOSIS — J4489 Other specified chronic obstructive pulmonary disease: Secondary | ICD-10-CM

## 2013-04-20 DIAGNOSIS — I10 Essential (primary) hypertension: Secondary | ICD-10-CM

## 2013-04-20 DIAGNOSIS — G2111 Neuroleptic induced parkinsonism: Secondary | ICD-10-CM

## 2013-04-20 DIAGNOSIS — J449 Chronic obstructive pulmonary disease, unspecified: Secondary | ICD-10-CM

## 2013-04-20 MED ORDER — NEBIVOLOL HCL 5 MG PO TABS: 5.0000 mg | ORAL_TABLET | Freq: Every day | ORAL | Status: DC

## 2013-04-20 MED ORDER — RIVAROXABAN 20 MG PO TABS: 20.0000 mg | ORAL_TABLET | Freq: Every day | ORAL | Status: DC

## 2013-04-20 MED ORDER — CARBAMAZEPINE 200 MG PO TABS: 300.0000 mg | ORAL_TABLET | Freq: Two times a day (BID) | ORAL | Status: DC

## 2013-04-20 MED ORDER — LORAZEPAM 0.5 MG PO TABS: 0.5000 mg | ORAL_TABLET | Freq: Four times a day (QID) | ORAL | Status: DC | PRN

## 2013-04-20 MED ORDER — CARBIDOPA-LEVODOPA 25-100 MG PO TABS: 1.0000 | ORAL_TABLET | Freq: Three times a day (TID) | ORAL | Status: DC

## 2013-04-20 MED ORDER — ACETAMINOPHEN ER 650 MG PO TBCR: 650.0000 mg | EXTENDED_RELEASE_TABLET | Freq: Three times a day (TID) | ORAL | Status: AC | PRN

## 2013-04-20 NOTE — Progress Notes (Signed)
Patient ID: Derek Hicks, male   DOB: [birth year], 62 y.o.   MRN: [id number] Location:  [facility]  Code Status: full code   Allergies  Allergen Reactions  . Benzodiazepines Other (See Comments)    Causes aggression, paradoxical reaction  . Other     Anti-anxiety medications cause agitation  . Wellbutrin (Bupropion) Other (See Comments)    Reaction unknown    Chief Complaint  Patient presents with  . Hospitalization Follow-up    HPI: Patient is a 62 y.o. white male seen in the office today for hospital f/u.  He had been staying at [facility], but was discharged to an assisted living level of care.  His meds were not changed at discharge.  Now at [facility].  He had been doing fine initially.  Then started to get off balance.  Could not pick up legs, fell Saturday.  Landed on knees.  Went to [facility].  No findings on labwork.  Treated abrasion on knee and sent home.  Feels anxious and wants to stop falling.    Review of Systems:  Review of Systems  Constitutional: Negative for fever and malaise/fatigue.  HENT: Negative for congestion.   Eyes: Negative for blurred vision.  Respiratory: Negative for cough and shortness of breath.   Cardiovascular: Negative for chest pain and leg swelling.  Gastrointestinal: Negative for heartburn, abdominal pain and constipation.  Genitourinary: Negative for dysuria, urgency and frequency.  Musculoskeletal: Positive for falls.  Skin: Negative for rash.  Neurological: Positive for dizziness. Negative for focal weakness, seizures, loss of consciousness, weakness and headaches.  Endo/Heme/Allergies: Does not bruise/bleed easily.  Psychiatric/Behavioral: Negative for depression, suicidal ideas, hallucinations and memory loss. The patient is nervous/anxious. The patient does not have insomnia.     Past Medical History  Diagnosis Date  . COPD (chronic obstructive pulmonary disease)   . Pulmonary embolus   .  Hypertension   . Bipolar 1 disorder   . Neuroleptic-induced Parkinsonism   . Anxiety   . Hyperlipidemia   . Falls infrequently   . Current use of long term anticoagulation   . Hypothyroidism   . Shortness of breath   . MI (myocardial infarction) 2006    Past Surgical History  Procedure Laterality Date  . Rotator cuff repair      x 2     Social History:   reports that he quit smoking about 10 years ago. He has never used smokeless tobacco. He reports that he does not drink alcohol or use illicit drugs.  Family History  Problem Relation Age of Onset  . Aneurysm Mother   . Bipolar disorder Mother   . Other      No family history of DVT or PE    Medications: Patient's Medications  New Prescriptions   No medications on file  Previous Medications   ALBUTEROL (PROVENTIL HFA;VENTOLIN HFA) 108 (90 BASE) MCG/ACT INHALER    Inhale 2 puffs into the lungs every 6 (six) hours as needed. For shortness of breath   BENZTROPINE (COGENTIN) 1 MG TABLET    Take 1 mg by mouth 2 (two) times daily.   CARBAMAZEPINE (TEGRETOL) 200 MG TABLET    Take 150 mg by mouth 2 (two) times daily. Take 1 1/2 tablets twice daily.   CARBIDOPA-LEVODOPA (SINEMET IR) 25-100 MG PER TABLET    Take 1 tablet by mouth 3 (three) times daily.   CLONIDINE (CATAPRES) 0.1 MG TABLET    Take 0.1 mg  by mouth every 6 (six) hours as needed. Take 1 tablet by mouth as needed for BP over 170.   FLUTICASONE-SALMETEROL (ADVAIR) 250-50 MCG/DOSE AEPB    Inhale 1 puff into the lungs 2 (two) times daily.   HYDROCODONE-ACETAMINOPHEN (NORCO/VICODIN) 5-325 MG PER TABLET    Take one tablet by mouth three times daily as needed for pain   LEVOTHYROXINE (SYNTHROID, LEVOTHROID) 75 MCG TABLET    Take 75 mcg by mouth every morning.    LORAZEPAM (ATIVAN) 0.5 MG TABLET    Take 0.5 mg by mouth 3 (three) times daily. Take one tablet by mouth three times daily for anxiety   LOSARTAN (COZAAR) 100 MG TABLET    Take 100 mg by mouth daily.   OLANZAPINE  (ZYPREXA) 5 MG TABLET    Take 7.5 mg by mouth at bedtime.   OMEPRAZOLE (PRILOSEC) 20 MG CAPSULE    Take 20 mg by mouth every morning.   PRAVASTATIN (PRAVACHOL) 20 MG TABLET    Take 20 mg by mouth every evening.   TAMSULOSIN (FLOMAX) 0.4 MG CAPS    Take 0.4 mg by mouth at bedtime.   WARFARIN (COUMADIN) 3 MG TABLET    Take 3 mg by mouth every evening.  Modified Medications   No medications on file  Discontinued Medications   No medications on file     Physical Exam: Filed Vitals:   04/20/13 1508  BP: 160/90  Pulse: 96  Temp: 98.1 F (36.7 C)  TempSrc: Oral  Resp: 18  Height: 5' 8.11" (1.73 m)  Weight: 230 lb (104.327 kg)  SpO2: 95%  Physical Exam  Constitutional: He is oriented to person, place, and time. He appears well-developed and well-nourished. No distress.  HENT:  Head: Normocephalic and atraumatic.  Right Ear: External ear normal.  Left Ear: External ear normal.  Nose: Nose normal.  Mouth/Throat: Oropharynx is clear and moist. No oropharyngeal exudate.  Eyes: Conjunctivae and EOM are normal. Pupils are equal, round, and reactive to light.  Neck: Normal range of motion. Neck supple. No JVD present.  Cardiovascular: Normal rate, regular rhythm, normal heart sounds and intact distal pulses.   Pulmonary/Chest: Effort normal and breath sounds normal. No respiratory distress.  Abdominal: Soft. Bowel sounds are normal. He exhibits no distension. There is no tenderness.  Musculoskeletal: Normal range of motion. He exhibits no edema and no tenderness.  Lymphadenopathy:    He has no cervical adenopathy.  Neurological: He is alert and oriented to person, place, and time. He has normal reflexes.  Slight tremor  Skin: Skin is warm and dry.  Psychiatric: His behavior is normal.  Anxious, pleasant     Labs reviewed: Basic Metabolic Panel:  Recent Labs  45/40/98 0647 12/05/12 0622 12/07/12 0730 01/27/13 2239 04/18/13 2143  NA 139 139 137 130* 136  K 4.0 4.2 4.4 3.8  4.3  CL 108 107 102 96 100  CO2 25 25 26   --   --   GLUCOSE 113* 125* 134* 142* 124*  BUN 19 12 13 6 9   CREATININE 1.39* 1.09 1.00 0.80 1.00  CALCIUM 8.4 9.0 9.6  --   --   TSH  --  1.287  --   --   --    Liver Function Tests:  Recent Labs  08/04/12 1540 12/03/12 0908 12/04/12 0647  AST 17 69* 33  ALT 23 14 5   ALKPHOS 50 43 39  BILITOT 0.3 0.6 0.5  PROT 7.3 6.9 5.4*  ALBUMIN 3.9 3.3* 2.5*  Recent Labs  12/03/12 0908  LIPASE 39  CBC:  Recent Labs  07/15/12 0040 08/04/12 1540 12/03/12 0908 12/04/12 0647 01/27/13 2239 04/18/13 2143  WBC 8.8 8.9 15.5* 10.7*  --   --   NEUTROABS 5.3 6.3  --   --   --   --   HGB 15.2 14.9 12.3* 9.7* 17.0 17.3*  HCT 44.2 44.6 37.8* 29.7* 50.0 51.0  MCV 84.5 84.8 90.4 91.1  --   --   PLT 298 341 347 301  --   --    Lipid Panel:  Recent Labs  12/05/12 0622  CHOL 159  HDL 38*  LDLCALC 81  TRIG 161*  CHOLHDL 4.2  Assessment/Plan 1. Neuroleptic-induced Parkinsonism -I don't believe he has PD itself, and would favor d/cing his sinemet  2. Bipolar disorder -stable at this point, will try to reduce benzos which he does not tolerate, probably would benefit from geriatric psychiatry long term to keep a close eye on this and prevent med interactions  3. COPD (chronic obstructive pulmonary disease) -well controlled  4. Pulmonary embolism -change to xarelto from coumadin to avoid frequent INRs  5. BPH (benign prostatic hyperplasia) -stable, asymptomatic at this time  6. fall, subsequent encounter - acetaminophen (TYLENOL 8 HOUR) 650 MG CR tablet; Take 1 tablet (650 mg total) by mouth every 8 (eight) hours as needed for pain.  Dispense: 90 tablet; Refill: 0 -will try to reduce medications that increase risk of falls like benzos though he has h/o bipolar with psychosis   7. Essential hypertension, benign -bp not controlled--added bystolic 5mg  to regimen, cont to monitor at [facility]  8. Seizures -cont tegretrol, but check level due  to falling, gait instability - Carbamazepine level, free  Labs/tests ordered:  Tegretol level, PPD Next appt:  6 wks

## 2013-04-20 NOTE — Patient Instructions (Addendum)
Get up slowly.  Try to lift your feet when you walk.   I stopped several of your medicines that affect your balance.    I added a new blood pressure medicine.   Bystolic is the new pill.   If your bp is staying over 150/90, I need to know so I can change your medicines.

## 2013-04-21 ENCOUNTER — Other Ambulatory Visit: Payer: Self-pay | Admitting: Geriatric Medicine

## 2013-04-21 DIAGNOSIS — F319 Bipolar disorder, unspecified: Secondary | ICD-10-CM

## 2013-04-21 MED ORDER — BENZTROPINE MESYLATE 1 MG PO TABS: 1.0000 mg | ORAL_TABLET | Freq: Two times a day (BID) | ORAL | Status: DC

## 2013-04-21 MED ORDER — LEVOTHYROXINE SODIUM 75 MCG PO TABS: 75.0000 ug | ORAL_TABLET | Freq: Every morning | ORAL | Status: DC

## 2013-04-21 MED ORDER — LORAZEPAM 0.5 MG PO TABS: ORAL_TABLET | ORAL | Status: DC

## 2013-04-21 MED ORDER — TAMSULOSIN HCL 0.4 MG PO CAPS: 0.4000 mg | ORAL_CAPSULE | Freq: Every day | ORAL | Status: DC

## 2013-04-21 MED ORDER — FLUTICASONE-SALMETEROL 250-50 MCG/DOSE IN AEPB: 1.0000 | INHALATION_SPRAY | Freq: Two times a day (BID) | RESPIRATORY_TRACT | Status: DC

## 2013-04-21 MED ORDER — ALBUTEROL SULFATE HFA 108 (90 BASE) MCG/ACT IN AERS: 2.0000 | INHALATION_SPRAY | Freq: Four times a day (QID) | RESPIRATORY_TRACT | Status: DC | PRN

## 2013-04-21 MED ORDER — LOSARTAN POTASSIUM 100 MG PO TABS: 100.0000 mg | ORAL_TABLET | Freq: Every day | ORAL | Status: DC

## 2013-04-21 MED ORDER — PRAVASTATIN SODIUM 20 MG PO TABS: 20.0000 mg | ORAL_TABLET | Freq: Every evening | ORAL | Status: DC

## 2013-04-27 ENCOUNTER — Ambulatory Visit (INDEPENDENT_AMBULATORY_CARE_PROVIDER_SITE_OTHER): Payer: 59

## 2013-04-27 DIAGNOSIS — Z111 Encounter for screening for respiratory tuberculosis: Secondary | ICD-10-CM

## 2013-04-27 DIAGNOSIS — R569 Unspecified convulsions: Secondary | ICD-10-CM

## 2013-05-07 ENCOUNTER — Ambulatory Visit (INDEPENDENT_AMBULATORY_CARE_PROVIDER_SITE_OTHER): Payer: 59 | Admitting: Nurse Practitioner

## 2013-05-07 ENCOUNTER — Encounter: Payer: Self-pay | Admitting: Nurse Practitioner

## 2013-05-07 ENCOUNTER — Other Ambulatory Visit: Payer: Self-pay | Admitting: Nurse Practitioner

## 2013-05-07 VITALS — BP 170/100 | HR 77 | Temp 97.4°F | Resp 16 | Ht 68.0 in | Wt 223.5 lb

## 2013-05-07 DIAGNOSIS — R7309 Other abnormal glucose: Secondary | ICD-10-CM

## 2013-05-07 DIAGNOSIS — K219 Gastro-esophageal reflux disease without esophagitis: Secondary | ICD-10-CM

## 2013-05-07 DIAGNOSIS — R739 Hyperglycemia, unspecified: Secondary | ICD-10-CM

## 2013-05-07 DIAGNOSIS — I10 Essential (primary) hypertension: Secondary | ICD-10-CM

## 2013-05-07 DIAGNOSIS — F319 Bipolar disorder, unspecified: Secondary | ICD-10-CM

## 2013-05-07 MED ORDER — NEBIVOLOL HCL 10 MG PO TABS: 10.0000 mg | ORAL_TABLET | Freq: Every day | ORAL | Status: DC

## 2013-05-07 MED ORDER — OLANZAPINE 7.5 MG PO TABS: 7.5000 mg | ORAL_TABLET | Freq: Every day | ORAL | Status: DC

## 2013-05-07 MED ORDER — HYDROCHLOROTHIAZIDE 25 MG PO TABS: 25.0000 mg | ORAL_TABLET | Freq: Every day | ORAL | Status: DC

## 2013-05-07 NOTE — Progress Notes (Signed)
Patient ID: Derek Hicks, male   DOB: Sep 14, 1951, 62 y.o.   MRN: [id number]   Allergies  Allergen Reactions  . Benzodiazepines Other (See Comments)    Causes aggression, paradoxical reaction  . Other     Anti-anxiety medications cause agitation  . Wellbutrin (Bupropion) Other (See Comments)    Reaction unknown    Chief Complaint  Patient presents with  . Follow-up    BP elevated, ? diabetes    HPI: Patient is a 62 y.o. male seen in the office today for follow up on blood pressure and medication review. Pt was a resident of [facility] and now lives in a family care home. He is establishing care here in the office now. Saw Dr Renato Gails 3 week ago for medication review and she added bystolic for better blood pressure control however blood pressures have still been elevated over 170/100.  Overall everything is going good now that he has been discharged from [facility] except his poor blood pressure control. Caregiver would like him screened for diabetes. He has had elevation in blood sugars in the past.   Review of Systems:  Review of Systems  Constitutional: Negative for fever, chills and malaise/fatigue.  HENT: Negative for hearing loss, nosebleeds and sore throat.   Eyes: Negative for blurred vision.  Respiratory: Negative for cough, sputum production and shortness of breath.   Cardiovascular: Negative for chest pain, palpitations and leg swelling.  Gastrointestinal: Negative for heartburn, abdominal pain, diarrhea and constipation.  Genitourinary: Positive for frequency. Negative for dysuria and urgency.  Musculoskeletal: Negative for myalgias and joint pain.  Skin: Negative for itching and rash.  Neurological: Negative for dizziness, tingling, weakness and headaches.  Psychiatric/Behavioral: Negative for depression. The patient is not nervous/anxious and does not have insomnia.      Past Medical History  Diagnosis Date  . COPD (chronic obstructive pulmonary disease)   .  Pulmonary embolus   . Hypertension   . Bipolar 1 disorder   . Neuroleptic-induced Parkinsonism   . Anxiety   . Hyperlipidemia   . Falls infrequently   . Current use of long term anticoagulation   . Hypothyroidism   . Shortness of breath   . MI (myocardial infarction) 2006   Past Surgical History  Procedure Laterality Date  . Rotator cuff repair      x 2    Social History:   reports that he quit smoking about 10 years ago. He has never used smokeless tobacco. He reports that he does not drink alcohol or use illicit drugs.  Family History  Problem Relation Age of Onset  . Aneurysm Mother   . Bipolar disorder Mother   . Other      No family history of DVT or PE    Medications: Patient's Medications  New Prescriptions   HYDROCHLOROTHIAZIDE (HYDRODIURIL) 25 MG TABLET    Take 1 tablet (25 mg total) by mouth daily.  Previous Medications   ACETAMINOPHEN (TYLENOL 8 HOUR) 650 MG CR TABLET    Take 1 tablet (650 mg total) by mouth every 8 (eight) hours as needed for pain.   ALBUTEROL (PROVENTIL HFA;VENTOLIN HFA) 108 (90 BASE) MCG/ACT INHALER    Inhale 2 puffs into the lungs every 6 (six) hours as needed. For shortness of breath   BENZTROPINE (COGENTIN) 1 MG TABLET    Take 1 tablet (1 mg total) by mouth 2 (two) times daily.   CARBAMAZEPINE (TEGRETOL) 200 MG TABLET    Take 1.5 tablets (300 mg  total) by mouth 2 (two) times daily.   CARBIDOPA-LEVODOPA (SINEMET IR) 25-100 MG PER TABLET    Take 1 tablet by mouth 3 (three) times daily.   FLUTICASONE-SALMETEROL (ADVAIR) 250-50 MCG/DOSE AEPB    Inhale 1 puff into the lungs 2 (two) times daily.   LEVOTHYROXINE (SYNTHROID, LEVOTHROID) 75 MCG TABLET    Take 1 tablet (75 mcg total) by mouth every morning.   LORAZEPAM (ATIVAN) 0.5 MG TABLET    Take one tablet by mouth three times daily for anxiety   LOSARTAN (COZAAR) 100 MG TABLET    Take 1 tablet (100 mg total) by mouth daily.   PRAVASTATIN (PRAVACHOL) 20 MG TABLET    Take 1 tablet (20 mg total) by  mouth every evening.   RIVAROXABAN (XARELTO) 20 MG TABS    Take 1 tablet (20 mg total) by mouth daily.   TAMSULOSIN (FLOMAX) 0.4 MG CAPS    Take 1 capsule (0.4 mg total) by mouth at bedtime.  Modified Medications   Modified Medication Previous Medication   NEBIVOLOL (BYSTOLIC) 10 MG TABLET nebivolol (BYSTOLIC) 5 MG tablet      Take 1 tablet (10 mg total) by mouth daily.    Take 1 tablet (5 mg total) by mouth daily.   OLANZAPINE (ZYPREXA) 7.5 MG TABLET OLANZapine (ZYPREXA) 5 MG tablet      Take 1 tablet (7.5 mg total) by mouth at bedtime.    Take 7.5 mg by mouth at bedtime.  Discontinued Medications   No medications on file     Physical Exam:  Filed Vitals:   05/07/13 0923  BP: 170/100  Pulse: 77  Temp: 97.4 F (36.3 C)  TempSrc: Oral  Resp: 16  Height: 5\' 8"  (1.727 m)  Weight: 223 lb 8 oz (101.379 kg)  SpO2: 95%    Physical Exam  Constitutional: He is oriented to person, place, and time and well-developed, well-nourished, and in no distress. No distress.  HENT:  Head: Normocephalic and atraumatic.  Mouth/Throat: Oropharynx is clear and moist. No oropharyngeal exudate.  Bad dentition- plans to have all teeth remove  Eyes: Conjunctivae and EOM are normal. Pupils are equal, round, and reactive to light. Right eye exhibits no discharge. Left eye exhibits no discharge.  Neck: Normal range of motion. Neck supple. No thyromegaly present.  Cardiovascular: Normal rate, regular rhythm and normal heart sounds.   Pulmonary/Chest: Effort normal and breath sounds normal. No respiratory distress.  Abdominal: Soft. Bowel sounds are normal. He exhibits no distension. There is no tenderness.  Musculoskeletal: Normal range of motion. He exhibits no edema and no tenderness.  Neurological: He is alert and oriented to person, place, and time.  Skin: Skin is warm and dry. He is not diaphoretic.  Psychiatric: Affect normal.     Labs reviewed: Basic Metabolic Panel:  Recent Labs   12/04/12 0647 12/05/12 0622 12/07/12 0730 01/27/13 2239 04/18/13 2143  NA 139 139 137 130* 136  K 4.0 4.2 4.4 3.8 4.3  CL 108 107 102 96 100  CO2 25 25 26   --   --   GLUCOSE 113* 125* 134* 142* 124*  BUN 19 12 13 6 9   CREATININE 1.39* 1.09 1.00 0.80 1.00  CALCIUM 8.4 9.0 9.6  --   --   TSH  --  1.287  --   --   --    Liver Function Tests:  Recent Labs  08/04/12 1540 12/03/12 0908 12/04/12 0647  AST 17 69* 33  ALT 23 14 5  ALKPHOS 50 43 39  BILITOT 0.3 0.6 0.5  PROT 7.3 6.9 5.4*  ALBUMIN 3.9 3.3* 2.5*    Recent Labs  12/03/12 0908  LIPASE 39   No results found for this basename: AMMONIA,  in the last 8760 hours CBC:  Recent Labs  07/15/12 0040 08/04/12 1540 12/03/12 0908 12/04/12 0647 01/27/13 2239 04/18/13 2143  WBC 8.8 8.9 15.5* 10.7*  --   --   NEUTROABS 5.3 6.3  --   --   --   --   HGB 15.2 14.9 12.3* 9.7* 17.0 17.3*  HCT 44.2 44.6 37.8* 29.7* 50.0 51.0  MCV 84.5 84.8 90.4 91.1  --   --   PLT 298 341 347 301  --   --    Lipid Panel:  Recent Labs  12/05/12 0622  CHOL 159  HDL 38*  LDLCALC 81  TRIG 098*  CHOLHDL 4.2    Assessment/Plan Essential hypertension, benign - Plan: will increase nebivolol (BYSTOLIC) to  10 MG tablet daily , hydrochlorothiazide (HYDRODIURIL) 25 MG tablet daily - cont to take blood pressures daily   GERD (gastroesophageal reflux disease)- no symptoms of acid reflux- pt is taking prilosec- will stop this at this time.   Hyperglycemia - Plan: Hemoglobin A1c, to take blood sugars in am   Bipolar disorder - stable to cont current medications- needing clarification of medication OLANZapine (ZYPREXA) 7.5 MG tablet sent to Rx    Will follow up in 4 weeks with blood sugars and blood pressure review.

## 2013-05-11 ENCOUNTER — Other Ambulatory Visit: Payer: Self-pay | Admitting: Geriatric Medicine

## 2013-05-11 DIAGNOSIS — I2699 Other pulmonary embolism without acute cor pulmonale: Secondary | ICD-10-CM

## 2013-05-11 MED ORDER — FORA LANCETS MISC: Status: DC

## 2013-05-11 MED ORDER — RIVAROXABAN 20 MG PO TABS: 20.0000 mg | ORAL_TABLET | Freq: Every day | ORAL | Status: DC

## 2013-05-11 MED ORDER — GLUCOSE BLOOD VI STRP: ORAL_STRIP | Status: DC

## 2013-05-11 MED ORDER — FORACARE PREMIUM V10 DEVI: 1.0000 | Freq: Every day | Status: AC

## 2013-05-13 ENCOUNTER — Ambulatory Visit (INDEPENDENT_AMBULATORY_CARE_PROVIDER_SITE_OTHER): Payer: 59 | Admitting: Nurse Practitioner

## 2013-05-13 ENCOUNTER — Encounter: Payer: Self-pay | Admitting: Nurse Practitioner

## 2013-05-13 VITALS — BP 138/82 | HR 75 | Temp 98.6°F | Resp 14 | Ht 68.0 in | Wt 221.0 lb

## 2013-05-13 DIAGNOSIS — R739 Hyperglycemia, unspecified: Secondary | ICD-10-CM

## 2013-05-13 DIAGNOSIS — I10 Essential (primary) hypertension: Secondary | ICD-10-CM

## 2013-05-13 DIAGNOSIS — F319 Bipolar disorder, unspecified: Secondary | ICD-10-CM

## 2013-05-13 DIAGNOSIS — R7309 Other abnormal glucose: Secondary | ICD-10-CM

## 2013-05-13 MED ORDER — GLUCOSE BLOOD VI STRP: ORAL_STRIP | Status: AC

## 2013-05-13 MED ORDER — FORA LANCETS MISC: Status: AC

## 2013-05-13 MED ORDER — OLANZAPINE 5 MG PO TABS: 5.0000 mg | ORAL_TABLET | Freq: Every day | ORAL | Status: AC

## 2013-05-13 NOTE — Patient Instructions (Addendum)
KEEP follow up appt   Will change blood sugars to M, W, F   Will decrease Zyprexa to 5mg  nightly   Cont to exercise and work on balance and eating properly- low sugar and salt

## 2013-05-13 NOTE — Progress Notes (Signed)
Patient ID: Derek Hicks, male   DOB: [birth year], 62 y.o.   MRN: [id number]   Allergies  Allergen Reactions  . Benzodiazepines Other (See Comments)    Causes aggression, paradoxical reaction  . Other     Anti-anxiety medications cause agitation  . Wellbutrin (Bupropion) Other (See Comments)    Reaction unknown    Chief Complaint  Patient presents with  . Medical Managment of Chronic Issues    Needs FL2 Form filled out for [facility]    HPI: Patient is a 62 y.o. male seen in the office today for FL2 forms and plan of care to be filled out.  Also to follow up on blood pressure and review lab work pts blood pressure has greatly improved since recent adjustments (increase bystolic to 10 and added HCTZ 25) Pts A1c is 6.5. Since pt has been at [facility] house he has been trying to eat better and exercise more.  They are keeping a log of his blood sugars but did not bring them to his visit today.  Pt reports he feels very unsteady still and repots this has happened since his zyprexa was increased from 5 mg to 7.5. He reports he is weird feeling in his head and his gait is off. Has to really be cautious not to fall and has had a lot of episodes of being off balance and almost falling.  Review of Systems:  Review of Systems  Constitutional: Negative for fever, chills and malaise/fatigue.  HENT: Negative for hearing loss.   Eyes: Negative for blurred vision and double vision.  Respiratory: Negative for cough, sputum production and shortness of breath.   Cardiovascular: Negative for chest pain, palpitations and leg swelling.  Gastrointestinal: Negative for abdominal pain, diarrhea and constipation.  Genitourinary: Negative for dysuria, urgency and frequency.  Musculoskeletal: Negative for myalgias, joint pain and falls.  Neurological: Negative for dizziness, tingling, tremors, weakness and headaches.       Off balance  Psychiatric/Behavioral: Negative for depression. The patient is not  nervous/anxious and does not have insomnia.      Past Medical History  Diagnosis Date  . COPD (chronic obstructive pulmonary disease)   . Pulmonary embolus   . Hypertension   . Bipolar 1 disorder   . Neuroleptic-induced Parkinsonism   . Anxiety   . Hyperlipidemia   . Falls infrequently   . Current use of long term anticoagulation   . Hypothyroidism   . Shortness of breath   . MI (myocardial infarction) 2006   Past Surgical History  Procedure Laterality Date  . Rotator cuff repair      x 2    Social History:   reports that he quit smoking about 10 years ago. He has never used smokeless tobacco. He reports that he does not drink alcohol or use illicit drugs.  Family History  Problem Relation Age of Onset  . Aneurysm Mother   . Bipolar disorder Mother   . Other      No family history of DVT or PE    Medications: Patient's Medications  New Prescriptions   No medications on file  Previous Medications   ACETAMINOPHEN (TYLENOL 8 HOUR) 650 MG CR TABLET    Take 1 tablet (650 mg total) by mouth every 8 (eight) hours as needed for pain.   ALBUTEROL SULFATE (PROAIR HFA IN)    Inhale two puffs every 6 hours as needed for SOB   BENZTROPINE (COGENTIN) 1 MG TABLET    Take 1 tablet (  1 mg total) by mouth 2 (two) times daily.   BLOOD GLUCOSE MONITORING SUPPL (FORACARE PREMIUM V10) DEVI    1 Device by Does not apply route daily. Use to monitor blood sugar. 250.00   CARBAMAZEPINE (TEGRETOL) 200 MG TABLET    Take 1.5 tablets (300 mg total) by mouth 2 (two) times daily.   CARBIDOPA-LEVODOPA (SINEMET IR) 25-100 MG PER TABLET    Take 1 tablet by mouth 3 (three) times daily.   FLUTICASONE-SALMETEROL (ADVAIR DISKUS) 250-50 MCG/DOSE AEPB    Inhale one puff into lungs twice daily   FORA LANCETS MISC    Use to check blood sugar once daily. 250.00   GLUCOSE BLOOD TEST STRIP    Use as instructed to test blood sugar once daily. 250.00   HYDROCHLOROTHIAZIDE (HYDRODIURIL) 25 MG TABLET    Take 1 tablet  (25 mg total) by mouth daily.   LEVOTHYROXINE (SYNTHROID, LEVOTHROID) 75 MCG TABLET    Take 1 tablet (75 mcg total) by mouth every morning.   LOSARTAN (COZAAR) 100 MG TABLET    Take 1 tablet (100 mg total) by mouth daily.   NEBIVOLOL (BYSTOLIC) 10 MG TABLET    Take 1 tablet (10 mg total) by mouth daily.   OLANZAPINE (ZYPREXA) 7.5 MG TABLET    Take 1 tablet (7.5 mg total) by mouth at bedtime.   PRAVASTATIN (PRAVACHOL) 20 MG TABLET    Take 1 tablet (20 mg total) by mouth every evening.   RIVAROXABAN (XARELTO) 20 MG TABS    Take 1 tablet (20 mg total) by mouth daily.  Modified Medications   No medications on file  Discontinued Medications   ALBUTEROL (PROVENTIL HFA;VENTOLIN HFA) 108 (90 BASE) MCG/ACT INHALER    Inhale 2 puffs into the lungs every 6 (six) hours as needed. For shortness of breath   FLUTICASONE-SALMETEROL (ADVAIR) 250-50 MCG/DOSE AEPB    Inhale 1 puff into the lungs 2 (two) times daily.   LORAZEPAM (ATIVAN) 0.5 MG TABLET    Take one tablet by mouth three times daily for anxiety   TAMSULOSIN (FLOMAX) 0.4 MG CAPS    Take 1 capsule (0.4 mg total) by mouth at bedtime.     Physical Exam:  Filed Vitals:   05/13/13 1420  BP: 138/82  Pulse: 75  Temp: 98.6 F (37 C)  TempSrc: Oral  Resp: 14  Height: 5\' 8"  (1.727 m)  Weight: 221 lb (100.245 kg)    Physical Exam  Constitutional: He is oriented to person, place, and time and well-developed, well-nourished, and in no distress. No distress.  HENT:  Head: Normocephalic and atraumatic.  Right Ear: External ear normal.  Left Ear: External ear normal.  Mouth/Throat: Oropharynx is clear and moist. No oropharyngeal exudate.  Eyes: Conjunctivae and EOM are normal. Pupils are equal, round, and reactive to light.  Cardiovascular: Normal rate, regular rhythm and normal heart sounds.   Pulmonary/Chest: Effort normal and breath sounds normal. No respiratory distress.  Abdominal: Soft. Bowel sounds are normal. He exhibits no distension.   Musculoskeletal: Normal range of motion. He exhibits no edema and no tenderness.  Neurological: He is alert and oriented to person, place, and time. No cranial nerve deficit. Coordination normal.  Cautious gait  Skin: Skin is warm and dry. He is not diaphoretic.     Labs reviewed: Basic Metabolic Panel:  Recent Labs  16/10/96 0647 12/05/12 0622 12/07/12 0730 01/27/13 2239 04/18/13 2143  NA 139 139 137 130* 136  K 4.0 4.2 4.4 3.8 4.3  CL 108 107 102 96 100  CO2 25 25 26   --   --   GLUCOSE 113* 125* 134* 142* 124*  BUN 19 12 13 6 9   CREATININE 1.39* 1.09 1.00 0.80 1.00  CALCIUM 8.4 9.0 9.6  --   --   TSH  --  1.287  --   --   --    Liver Function Tests:  Recent Labs  08/04/12 1540 12/03/12 0908 12/04/12 0647  AST 17 69* 33  ALT 23 14 5   ALKPHOS 50 43 39  BILITOT 0.3 0.6 0.5  PROT 7.3 6.9 5.4*  ALBUMIN 3.9 3.3* 2.5*    Recent Labs  12/03/12 0908  LIPASE 39   No results found for this basename: AMMONIA,  in the last 8760 hours CBC:  Recent Labs  07/15/12 0040 08/04/12 1540 12/03/12 0908 12/04/12 0647 01/27/13 2239 04/18/13 2143  WBC 8.8 8.9 15.5* 10.7*  --   --   NEUTROABS 5.3 6.3  --   --   --   --   HGB 15.2 14.9 12.3* 9.7* 17.0 17.3*  HCT 44.2 44.6 37.8* 29.7* 50.0 51.0  MCV 84.5 84.8 90.4 91.1  --   --   PLT 298 341 347 301  --   --    Lipid Panel:  Recent Labs  12/05/12 0622  CHOL 159  HDL 38*  LDLCALC 81  TRIG 629*  CHOLHDL 4.2    Past Procedures:     Assessment/Plan 1. Bipolar disorder Will decrease zyprexa to 5 mg at this time and follow up on behaviors and mood at next visit. - OLANZapine (ZYPREXA) 5 MG tablet; Take 1 tablet (5 mg total) by mouth at bedtime.  Dispense: 30 tablet; Refill: 3  2. Hypertension Improved with recent changes. Will cont current medications and to cont exercise and diet modifications  3. Hyperglycemia Will have blood sugars checked 3 times a week. To bring log to next visit. Encouraged low sugar  diet and cont exercise at least 30 mins 5 days a week - glucose blood test strip; Use as instructed to test blood sugar M, W, F daily. 250.00  Dispense: 100 each; Refill: 12 - FORA LANCETS MISC; Use to check blood sugar M, W, F. 250.00  Dispense: 100 each; Refill: 12   FL2 and care plan forms filled out for [facility].

## 2013-05-25 ENCOUNTER — Ambulatory Visit: Payer: Self-pay | Admitting: Nurse Practitioner

## 2013-05-26 ENCOUNTER — Telehealth: Payer: Self-pay | Admitting: Geriatric Medicine

## 2013-05-26 NOTE — Telephone Encounter (Signed)
Patient is still stumbling and falling. He is also shaking. The caregiver called and asked if the patient should be sent to see a neurologist? If you think so, please put in orders and we will schedule for the patient. Thanks.

## 2013-06-02 NOTE — Telephone Encounter (Signed)
Spoke with Derek Hicks and she said she will see you at the next office visit.

## 2013-06-02 NOTE — Telephone Encounter (Signed)
Will follow up with pt at upcoming office visit

## 2013-06-04 ENCOUNTER — Ambulatory Visit (INDEPENDENT_AMBULATORY_CARE_PROVIDER_SITE_OTHER): Payer: 59 | Admitting: Nurse Practitioner

## 2013-06-04 ENCOUNTER — Encounter: Payer: Self-pay | Admitting: Nurse Practitioner

## 2013-06-04 VITALS — BP 130/90 | HR 74 | Temp 98.6°F | Resp 18 | Ht 68.0 in | Wt 217.4 lb

## 2013-06-04 DIAGNOSIS — R259 Unspecified abnormal involuntary movements: Secondary | ICD-10-CM

## 2013-06-04 DIAGNOSIS — G219 Secondary parkinsonism, unspecified: Secondary | ICD-10-CM

## 2013-06-04 DIAGNOSIS — K5900 Constipation, unspecified: Secondary | ICD-10-CM

## 2013-06-04 DIAGNOSIS — I10 Essential (primary) hypertension: Secondary | ICD-10-CM

## 2013-06-04 DIAGNOSIS — T43591A Poisoning by other antipsychotics and neuroleptics, accidental (unintentional), initial encounter: Secondary | ICD-10-CM

## 2013-06-04 DIAGNOSIS — R251 Tremor, unspecified: Secondary | ICD-10-CM

## 2013-06-04 DIAGNOSIS — F319 Bipolar disorder, unspecified: Secondary | ICD-10-CM

## 2013-06-04 DIAGNOSIS — G2111 Neuroleptic induced parkinsonism: Secondary | ICD-10-CM

## 2013-06-04 MED ORDER — DOCUSATE SODIUM 100 MG PO CAPS: 100.0000 mg | ORAL_CAPSULE | Freq: Every day | ORAL | Status: AC

## 2013-06-04 MED ORDER — VILAZODONE HCL 10 & 20 & 40 MG PO KIT: 10.0000 mg | PACK | Freq: Every day | ORAL | Status: AC

## 2013-06-04 NOTE — Progress Notes (Signed)
Patient ID: Derek Hicks, male   DOB: Jul 06, 1951, 62 y.o.   MRN: [id number]   Allergies  Allergen Reactions  . Benzodiazepines Other (See Comments)    Causes aggression, paradoxical reaction  . Other     Anti-anxiety medications cause agitation  . Wellbutrin [Bupropion] Other (See Comments)    Reaction unknown    Chief Complaint  Patient presents with  . Follow-up    HPI: Patient is a 62 y.o. male seen in the office today for follow up. Pt here today with caregiver. Concerns of episodes where pt is shaking and has had falls over the past month. Pt reports he looses controlled over his boyd. Zyprexa was decreased to 5 mg daily and he is still having tremors. There has been no increase in anxiety, agitation or behaviors. Caregiver reports the shaking and falls are episodic. They were walking into the bank and she was ahead of pt and this brought on an episode of shaking or tremors. Caregiver placed pts hand on her arm and pt was okay. Pt reports if he knows someone is right there it makes him feel secure and helps with the shaking. Pt then admits to having anxiety and always with worries when his family does not call him back.   Review of Systems:  Review of Systems  Constitutional: Negative for weight loss and malaise/fatigue.  Respiratory: Negative for shortness of breath.   Cardiovascular: Negative for chest pain.  Gastrointestinal: Positive for constipation. Negative for heartburn, abdominal pain and diarrhea.  Genitourinary: Negative for dysuria, urgency and frequency.  Musculoskeletal: Negative for myalgias and joint pain.  Neurological: Negative for dizziness and weakness.       Reports shaking and then he has his fall-- this happens more when he has "a lot on his mind"  Psychiatric/Behavioral: Negative for depression. The patient is nervous/anxious. The patient does not have insomnia.      Past Medical History  Diagnosis Date  . COPD (chronic obstructive pulmonary disease)    . Pulmonary embolus   . Hypertension   . Bipolar 1 disorder   . Neuroleptic-induced Parkinsonism   . Anxiety   . Hyperlipidemia   . Falls infrequently   . Current use of long term anticoagulation   . Hypothyroidism   . Shortness of breath   . MI (myocardial infarction) 2006   Past Surgical History  Procedure Laterality Date  . Rotator cuff repair      x 2    Social History:   reports that he quit smoking about 10 years ago. He has never used smokeless tobacco. He reports that he does not drink alcohol or use illicit drugs.  Family History  Problem Relation Age of Onset  . Aneurysm Mother   . Bipolar disorder Mother   . Other      No family history of DVT or PE    Medications: Patient's Medications  New Prescriptions   No medications on file  Previous Medications   ACETAMINOPHEN (TYLENOL 8 HOUR) 650 MG CR TABLET    Take 1 tablet (650 mg total) by mouth every 8 (eight) hours as needed for pain.   ALBUTEROL SULFATE (PROAIR HFA IN)    Inhale two puffs every 6 hours as needed for SOB   BENZTROPINE (COGENTIN) 1 MG TABLET    Take 1 tablet (1 mg total) by mouth 2 (two) times daily.   BLOOD GLUCOSE MONITORING SUPPL (FORACARE PREMIUM V10) DEVI    1 Device by Does not apply route  daily. Use to monitor blood sugar. 250.00   CARBAMAZEPINE (TEGRETOL) 200 MG TABLET    Take 1.5 tablets (300 mg total) by mouth 2 (two) times daily.   CARBIDOPA-LEVODOPA (SINEMET IR) 25-100 MG PER TABLET    Take 1 tablet by mouth 3 (three) times daily.   FLUTICASONE-SALMETEROL (ADVAIR DISKUS) 250-50 MCG/DOSE AEPB    Inhale one puff into lungs twice daily   FORA LANCETS MISC    Use to check blood sugar M, W, F. 250.00   GLUCOSE BLOOD TEST STRIP    Use as instructed to test blood sugar M, W, F daily. 250.00   HYDROCHLOROTHIAZIDE (HYDRODIURIL) 25 MG TABLET    Take 1 tablet (25 mg total) by mouth daily.   LEVOTHYROXINE (SYNTHROID, LEVOTHROID) 75 MCG TABLET    Take 1 tablet (75 mcg total) by mouth every morning.    LOSARTAN (COZAAR) 100 MG TABLET    Take 1 tablet (100 mg total) by mouth daily.   NEBIVOLOL (BYSTOLIC) 10 MG TABLET    Take 1 tablet (10 mg total) by mouth daily.   OLANZAPINE (ZYPREXA) 5 MG TABLET    Take 1 tablet (5 mg total) by mouth at bedtime.   PRAVASTATIN (PRAVACHOL) 20 MG TABLET    Take 1 tablet (20 mg total) by mouth every evening.   RIVAROXABAN (XARELTO) 20 MG TABS    Take 1 tablet (20 mg total) by mouth daily.  Modified Medications   No medications on file  Discontinued Medications   No medications on file     Physical Exam:  Filed Vitals:   06/04/13 1052  BP: 130/90  Pulse: 74  Temp: 98.6 F (37 C)  TempSrc: Oral  Resp: 18  Height: 5\' 8"  (1.727 m)  Weight: 217 lb 6.4 oz (98.612 kg)  SpO2: 96%    Physical Exam  Constitutional: He is oriented to person, place, and time and well-developed, well-nourished, and in no distress. No distress.  HENT:  Head: Normocephalic and atraumatic.  Eyes: EOM are normal. Pupils are equal, round, and reactive to light.  Neck: Normal range of motion. Neck supple.  Cardiovascular: Normal rate, regular rhythm and normal heart sounds.   Pulmonary/Chest: Effort normal and breath sounds normal. No respiratory distress.  Abdominal: Soft. Bowel sounds are normal. He exhibits no distension.  Musculoskeletal: Normal range of motion. He exhibits no edema and no tenderness.  Normal gait and strenght  Neurological: He is alert and oriented to person, place, and time. He displays tremor (fine resting tremor noted). No cranial nerve deficit. He exhibits normal muscle tone. Gait normal.  Skin: Skin is warm and dry. He is not diaphoretic. No erythema.  Psychiatric: Affect normal.     Labs reviewed: Basic Metabolic Panel:  Recent Labs  40/98/11 0647 12/05/12 0622 12/07/12 0730 01/27/13 2239 04/18/13 2143  NA 139 139 137 130* 136  K 4.0 4.2 4.4 3.8 4.3  CL 108 107 102 96 100  CO2 25 25 26   --   --   GLUCOSE 113* 125* 134* 142* 124*   BUN 19 12 13 6 9   CREATININE 1.39* 1.09 1.00 0.80 1.00  CALCIUM 8.4 9.0 9.6  --   --   TSH  --  1.287  --   --   --    Liver Function Tests:  Recent Labs  08/04/12 1540 12/03/12 0908 12/04/12 0647  AST 17 69* 33  ALT 23 14 5   ALKPHOS 50 43 39  BILITOT 0.3 0.6 0.5  PROT  7.3 6.9 5.4*  ALBUMIN 3.9 3.3* 2.5*    Recent Labs  12/03/12 0908  LIPASE 39   No results found for this basename: AMMONIA,  in the last 8760 hours CBC:  Recent Labs  07/15/12 0040 08/04/12 1540 12/03/12 0908 12/04/12 0647 01/27/13 2239 04/18/13 2143  WBC 8.8 8.9 15.5* 10.7*  --   --   NEUTROABS 5.3 6.3  --   --   --   --   HGB 15.2 14.9 12.3* 9.7* 17.0 17.3*  HCT 44.2 44.6 37.8* 29.7* 50.0 51.0  MCV 84.5 84.8 90.4 91.1  --   --   PLT 298 341 347 301  --   --    Lipid Panel:  Recent Labs  12/05/12 0622  CHOL 159  HDL 38*  LDLCALC 81  TRIG 409*  CHOLHDL 4.2    Assessment/Plan 1. Bipolar disorder With anxiety will start pt on viibryd at this time. To call and notify office if any worsening behaviors occur, pt has thoughts of hurting himself or others pt and caregiver aware.  - Vilazodone HCl (VIIBRYD) 10 & 20 & 40 MG KIT; Take 10 mg by mouth daily.  Dispense: 1 kit; Refill: 0  2. Tremor Question if this is due to parkinson vs medication induced. Will get neurology referral at this time  - Ambulatory referral to Neurology  3. Hypertension Remains stable; will cont current medications   4. Unspecified constipation Ongoing constipation. May take colace also to increase activity and fluids. To add more fruits and vegetables to diet  - docusate sodium (COLACE) 100 MG capsule; Take 1 capsule (100 mg total) by mouth daily.  Dispense: 10 capsule; Refill: 0  Will follow up in 4 weeks regarding anxiety (viibrd started)

## 2013-06-04 NOTE — Patient Instructions (Addendum)
To follow up in 4 weeks  Will start Viibryd titration pack for anxiety/depression Day 1-7 take 10 mg daily Day 8-14 take 20 mg daily Day 15-30 take 40 mg daily Monitor for changes in behavior with the addition of this medication- any increase in anxiety. Agitation or depression you will need to call your provider immediatly   Will get neurology consult to evaluation and need for simemet   To start colace 100 mg daily this is OTC for stool softener- hold if having loose stools or diarrhea   Constipation, Adult Constipation is when a person has fewer than 3 bowel movements a week; has difficulty having a bowel movement; or has stools that are dry, hard, or larger than normal. As people grow older, constipation is more common. If you try to fix constipation with medicines that make you have a bowel movement (laxatives), the problem may get worse. Long-term laxative use may cause the muscles of the colon to become weak. A low-fiber diet, not taking in enough fluids, and taking certain medicines may make constipation worse. CAUSES   Certain medicines, such as antidepressants, pain medicine, iron supplements, antacids, and water pills.   Certain diseases, such as diabetes, irritable bowel syndrome (IBS), thyroid disease, or depression.   Not drinking enough water.   Not eating enough fiber-rich foods.   Stress or travel.  Lack of physical activity or exercise.  Not going to the restroom when there is the urge to have a bowel movement.  Ignoring the urge to have a bowel movement.  Using laxatives too much. SYMPTOMS   Having fewer than 3 bowel movements a week.   Straining to have a bowel movement.   Having hard, dry, or larger than normal stools.   Feeling full or bloated.   Pain in the lower abdomen.  Not feeling relief after having a bowel movement. DIAGNOSIS  Your caregiver will take a medical history and perform a physical exam. Further testing may be done for  severe constipation. Some tests may include:   A barium enema X-ray to examine your rectum, colon, and sometimes, your small intestine.  A sigmoidoscopy to examine your lower colon.  A colonoscopy to examine your entire colon. TREATMENT  Treatment will depend on the severity of your constipation and what is causing it. Some dietary treatments include drinking more fluids and eating more fiber-rich foods. Lifestyle treatments may include regular exercise. If these diet and lifestyle recommendations do not help, your caregiver may recommend taking over-the-counter laxative medicines to help you have bowel movements. Prescription medicines may be prescribed if over-the-counter medicines do not work.  HOME CARE INSTRUCTIONS   Increase dietary fiber in your diet, such as fruits, vegetables, whole grains, and beans. Limit high-fat and processed sugars in your diet, such as [place] fries, hamburgers, cookies, candies, and soda.   A fiber supplement may be added to your diet if you cannot get enough fiber from foods.   Drink enough fluids to keep your urine clear or pale yellow.   Exercise regularly or as directed by your caregiver.   Go to the restroom when you have the urge to go. Do not hold it.  Only take medicines as directed by your caregiver. Do not take other medicines for constipation without talking to your caregiver first. SEEK IMMEDIATE MEDICAL CARE IF:   You have bright red blood in your stool.   Your constipation lasts for more than 4 days or gets worse.   You have abdominal or rectal  pain.   You have thin, pencil-like stools.  You have unexplained weight loss. MAKE SURE YOU:   Understand these instructions.  Will watch your condition.  Will get help right away if you are not doing well or get worse. Document Released: 06/29/2004 Document Revised: 12/24/2011 Document Reviewed: 09/04/2011 [facility] Patient Information 2014 [place], [place].

## 2013-06-29 ENCOUNTER — Ambulatory Visit: Payer: 59 | Admitting: Nurse Practitioner

## 2013-06-29 ENCOUNTER — Institutional Professional Consult (permissible substitution): Payer: 59 | Admitting: Neurology

## 2013-06-30 IMAGING — CT CT CERVICAL SPINE W/O CM
3 of 9 series · 10 of 33 positions shown, 12 images · non-contrast
Comparison: CT brain scan of 08/04/2012

CT HEAD

CLINICAL DATA: Recent fall, history of Parkinson's disease,
increased spasticity with weakness

CT HEAD WITHOUT CONTRAST
CT MAXILLOFACIAL WITHOUT CONTRAST
CT CERVICAL SPINE WITHOUT CONTRAST
TECHNIQUE: Multidetector CT imaging of the head, cervical spine,
and maxillofacial structures were performed using the standard
protocol without intravenous contrast. Multiplanar CT image
reconstructions of the cervical spine and maxillofacial structures
were also generated.

[Series 3: recon 2: brain · axial · 0.47mm/px · z∈[-109,-59]mm · 2 of 128 slices shown, 3 images]
[im 43/128  soft-tissue]
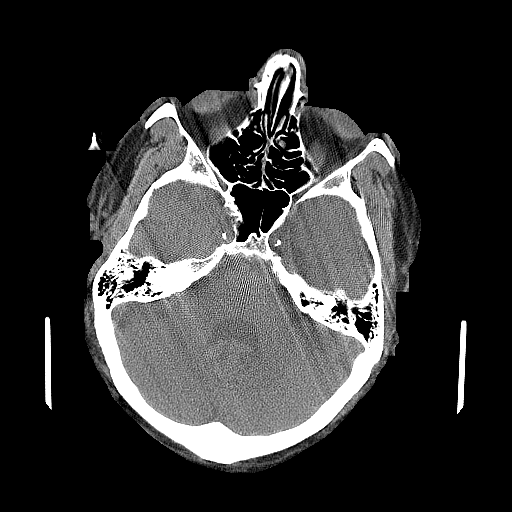
[im 43/128  bone]
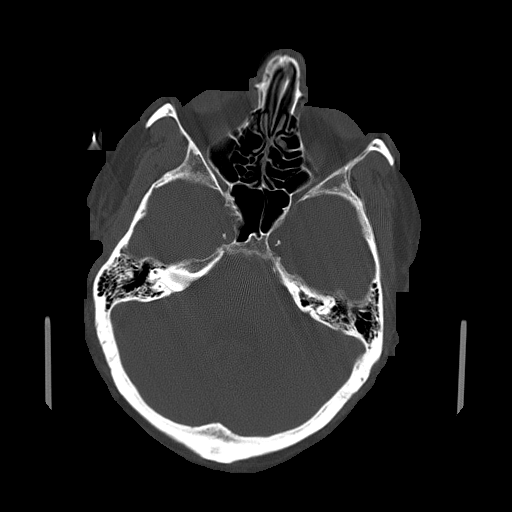
[im 85/128  bone]
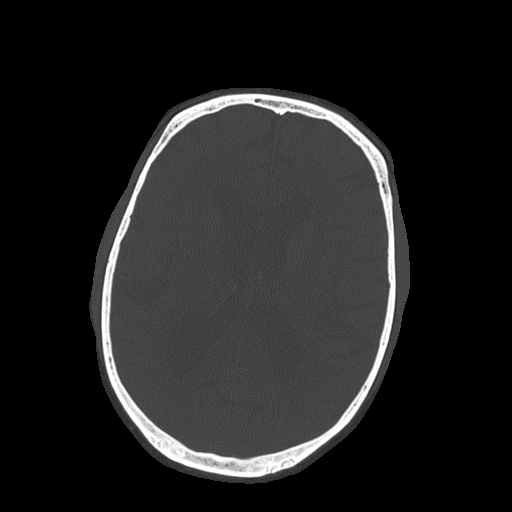

[Series 900: sagittal · sagittal · 0.39mm/px · 5 of 37 slices shown, 6 images]
[im 13/37  bone]
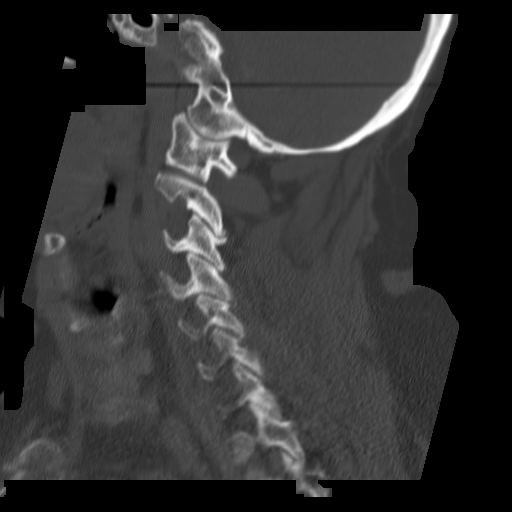
[im 16/37  bone]
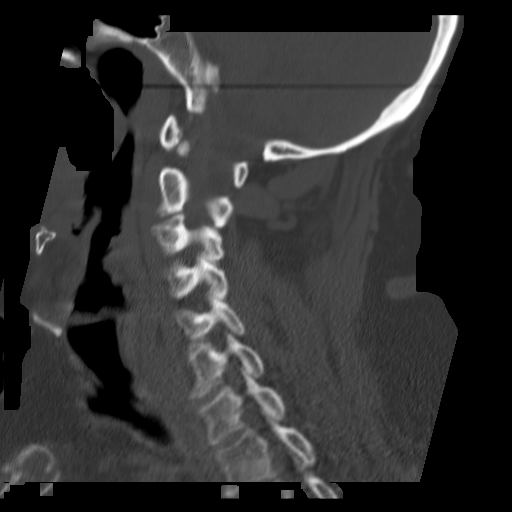
[im 19/37  soft-tissue]
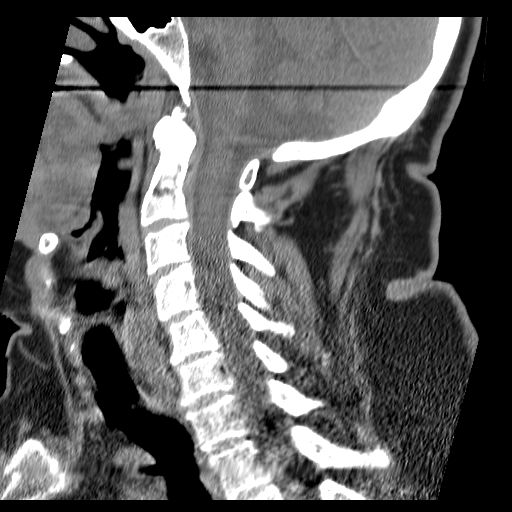
[im 19/37  bone]
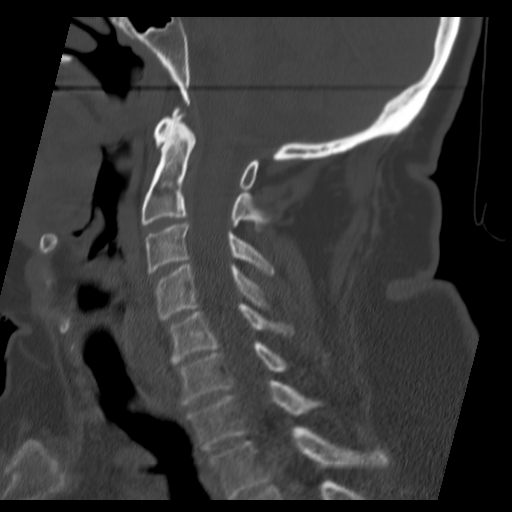
[im 22/37  bone]
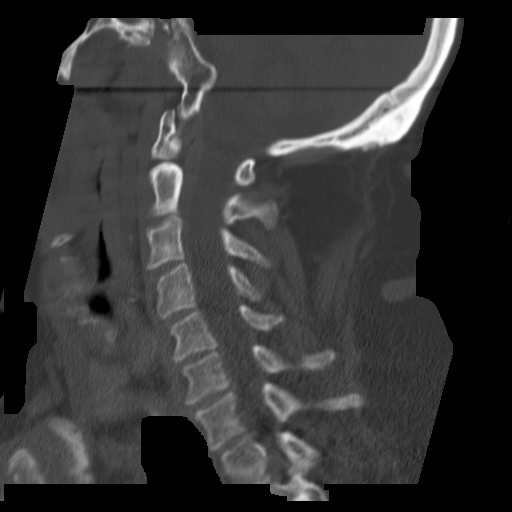
[im 25/37  bone]
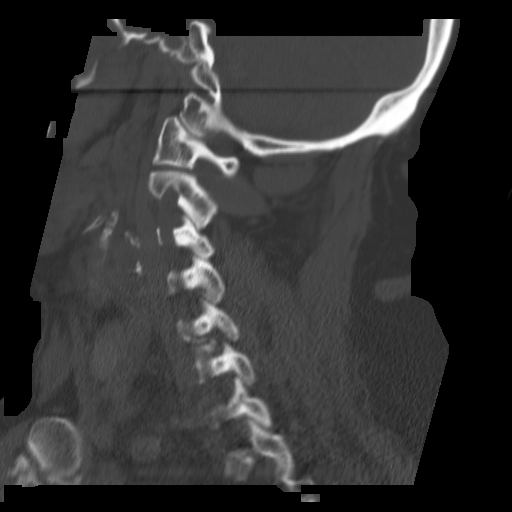

[Series 901: coronal · coronal · 0.39mm/px · 3 of 37 slices shown]
[im 8/37  bone]
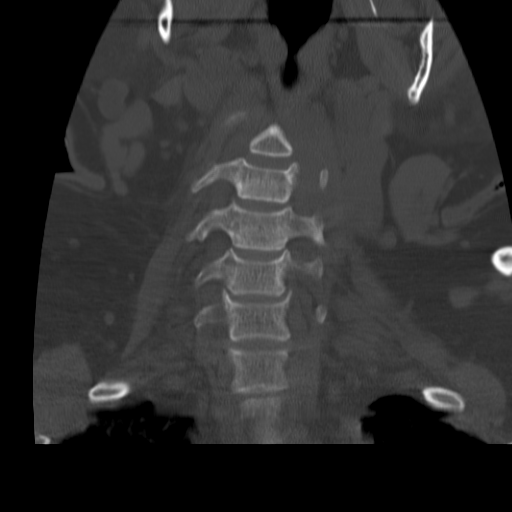
[im 15/37  bone]
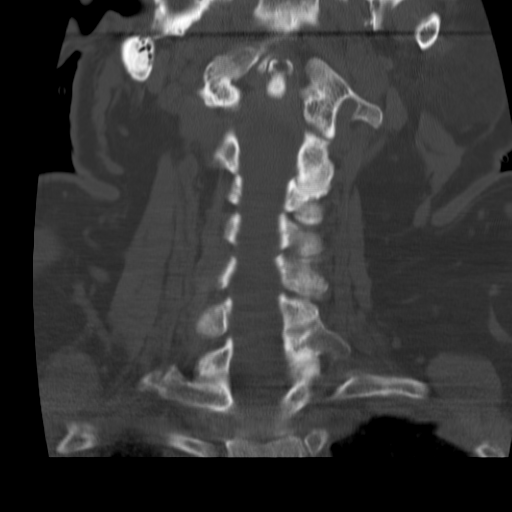
[im 22/37  bone]
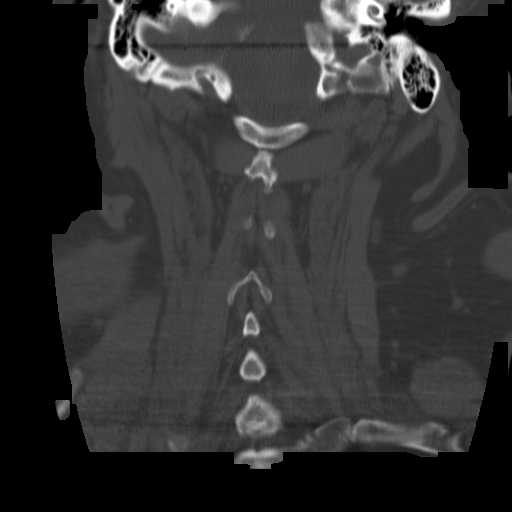

[10 of 33 positions shown; findings below may reference images not displayed]

FINDINGS: The ventricular system remains prominent, and the
cortical sulci are prominent consistent with diffuse entry.  The
septum is in a normal midline position.  Moderate small vessel
ischemic change is stable throughout the periventricular white
matter.  No hemorrhage, mass lesion, or acute infarction is seen.
There does appear to be a right facial hematoma overlying the
zygomatic arch.
IMPRESSION: Atrophy and small vessel disease.  Right facial hematoma.

CT MAXILLOFACIAL
FINDINGS: A superficial hematoma overlies the right zygomatic
arch.  However no fracture of the zygomatic arch is seen.  The
orbital rims also appear intact.  The mandible is intact and
mandibular condyles are in normal position.  The nasal bone appears
intact. The paranasal sinuses appear intact.
IMPRESSION: No maxillofacial fracture.  Hematoma overlies the right zygomatic
arch.

CT CERVICAL SPINE
FINDINGS: The cervical vertebrae are in normal alignment.  Only
mild degenerative disc disease is noted at C5-6 with some loss of
disc space and spurring with sclerosis.  The odontoid process is
intact.  No cervical spine fracture is seen.  No prevertebral soft
tissue swelling is noted.  The thyroid gland is unremarkable.  No
adenopathy is seen throughout the neck.
IMPRESSION: Normal alignment with degenerative disc disease at C5-6.  No acute
cervical spine fracture.

## 2013-06-30 IMAGING — CR DG FEMUR 2+V*R*
4 series · 4 of 4 positions shown · non-contrast
Comparison: None.

CLINICAL DATA: Fall, right proximal femoral pain.

RIGHT FEMUR - 2 VIEW

[t femur proximal ap right]
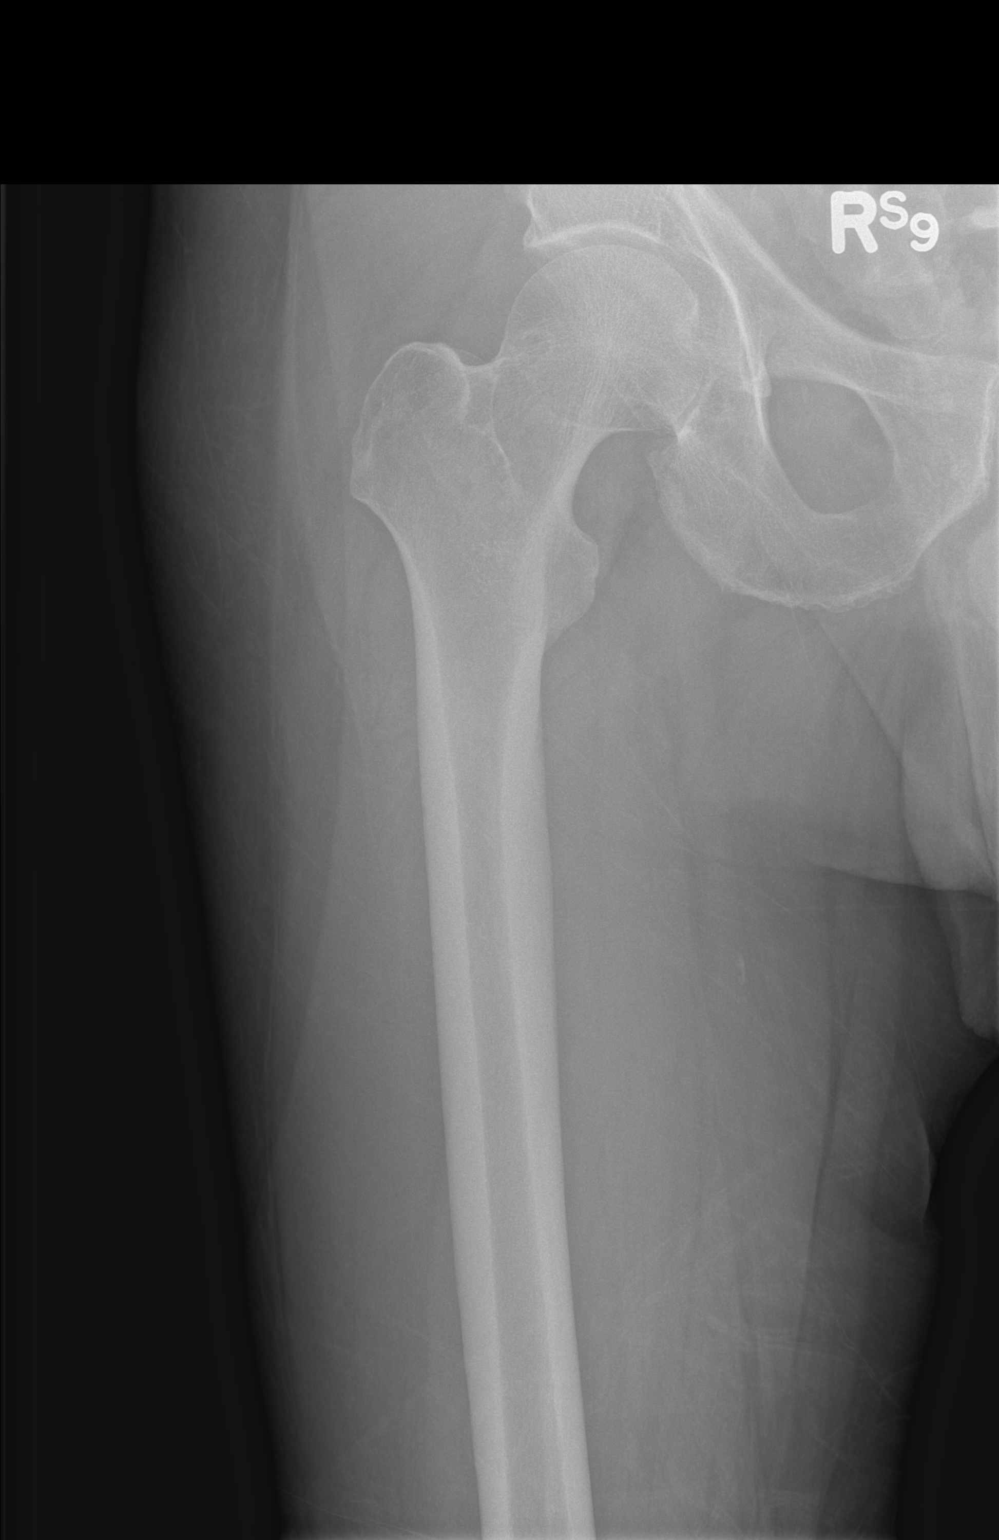

[t femur distal ap right]
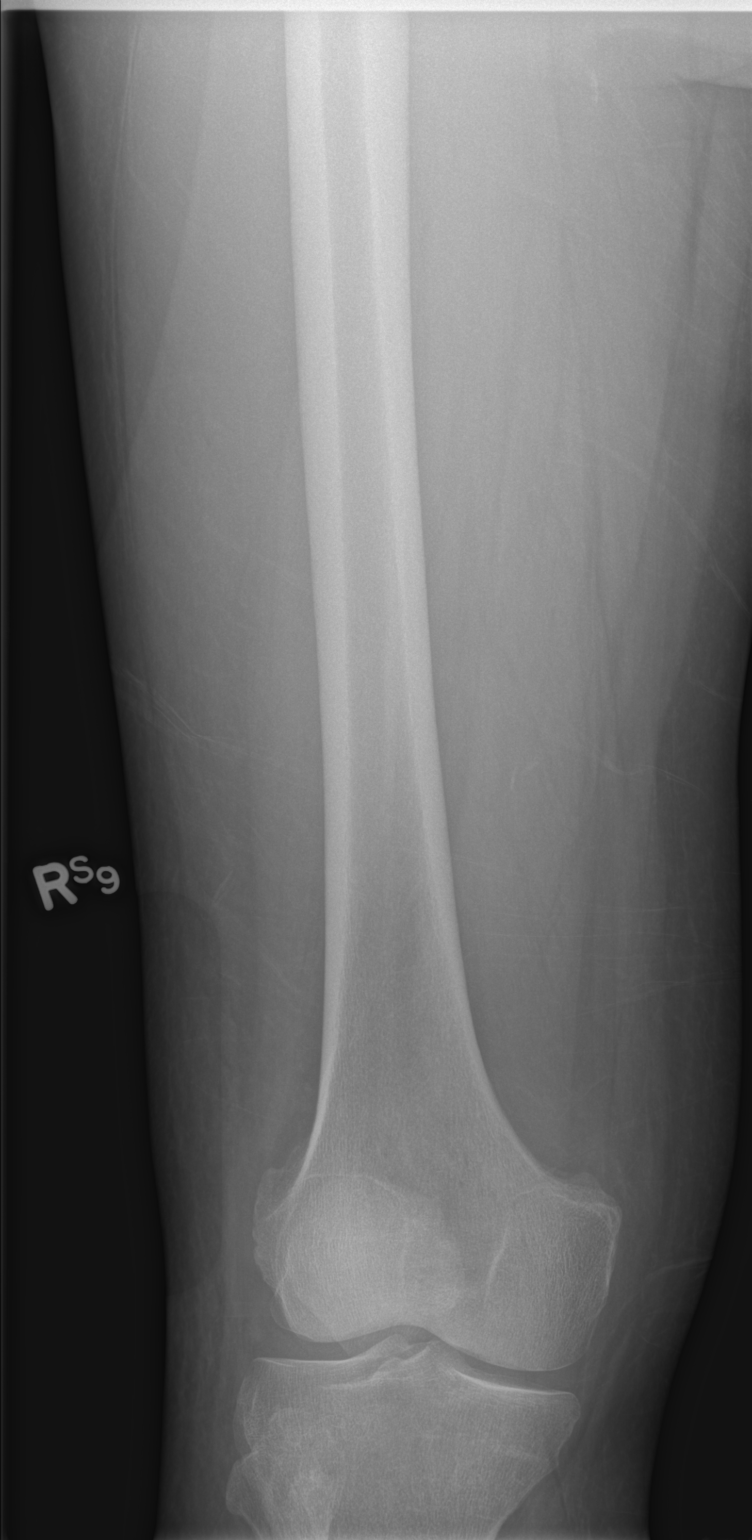

[t femur proximal lat right]
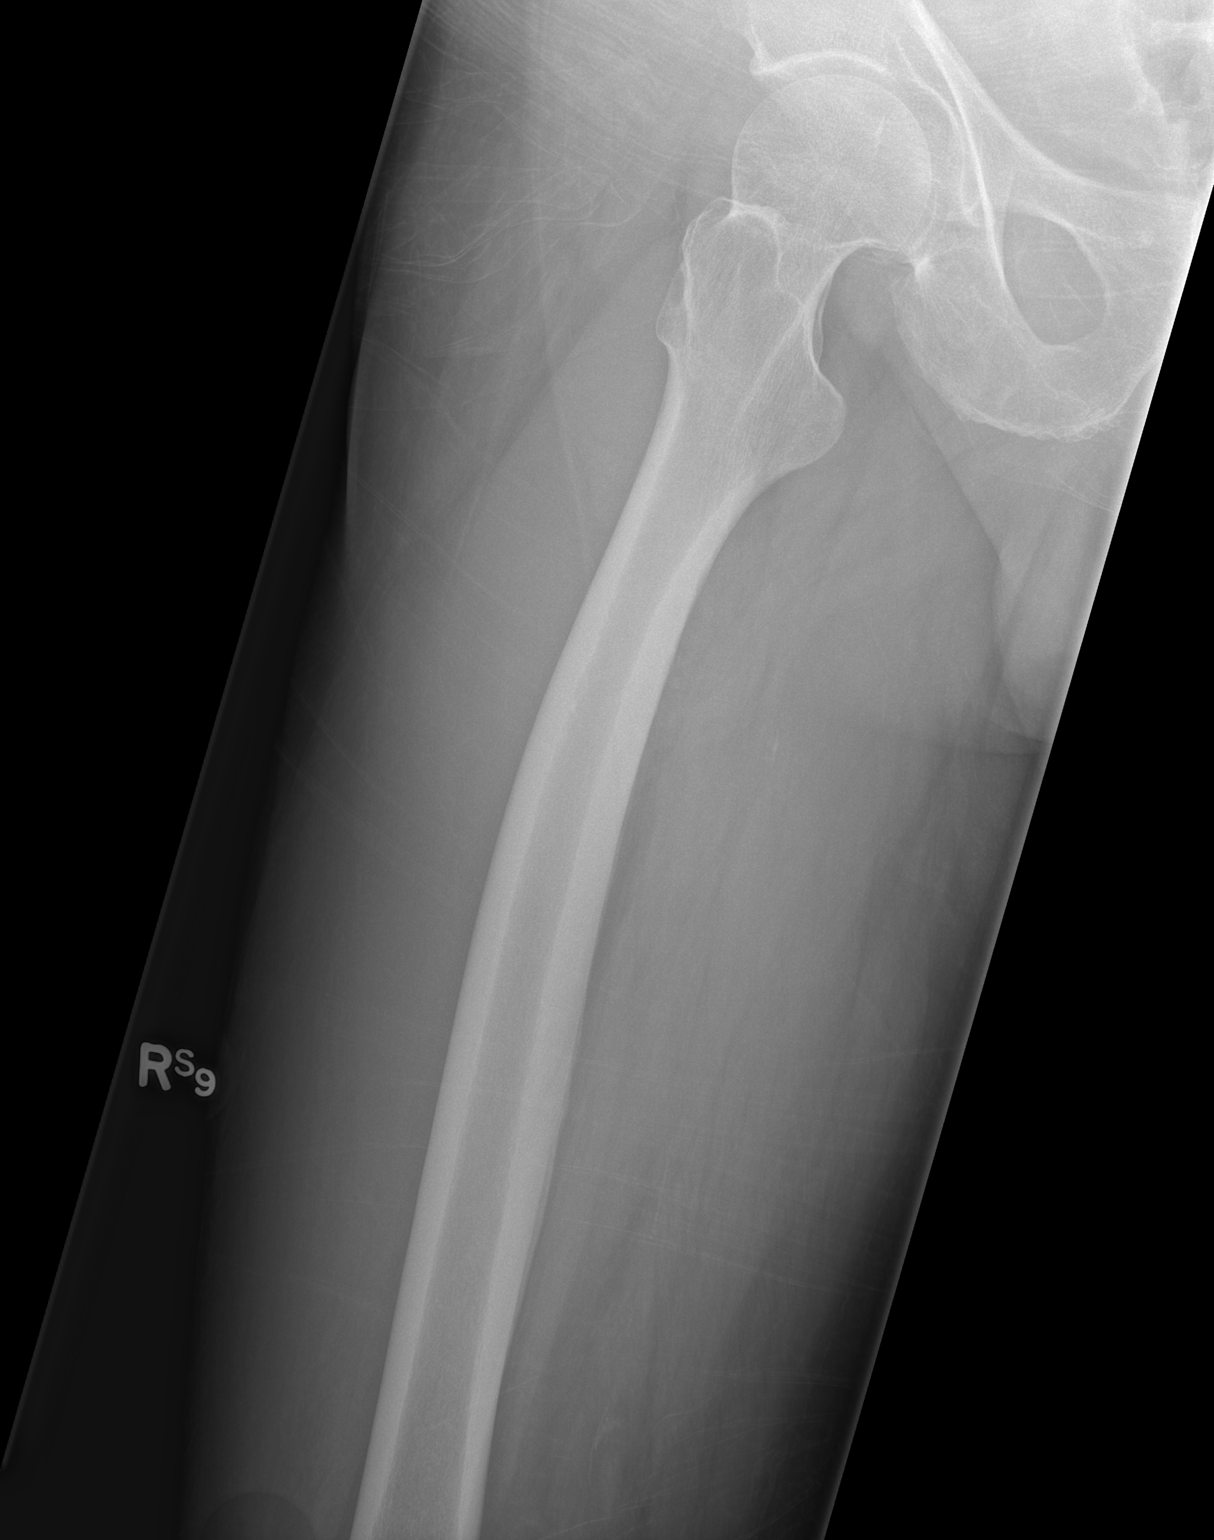

[x femur distal lat right]
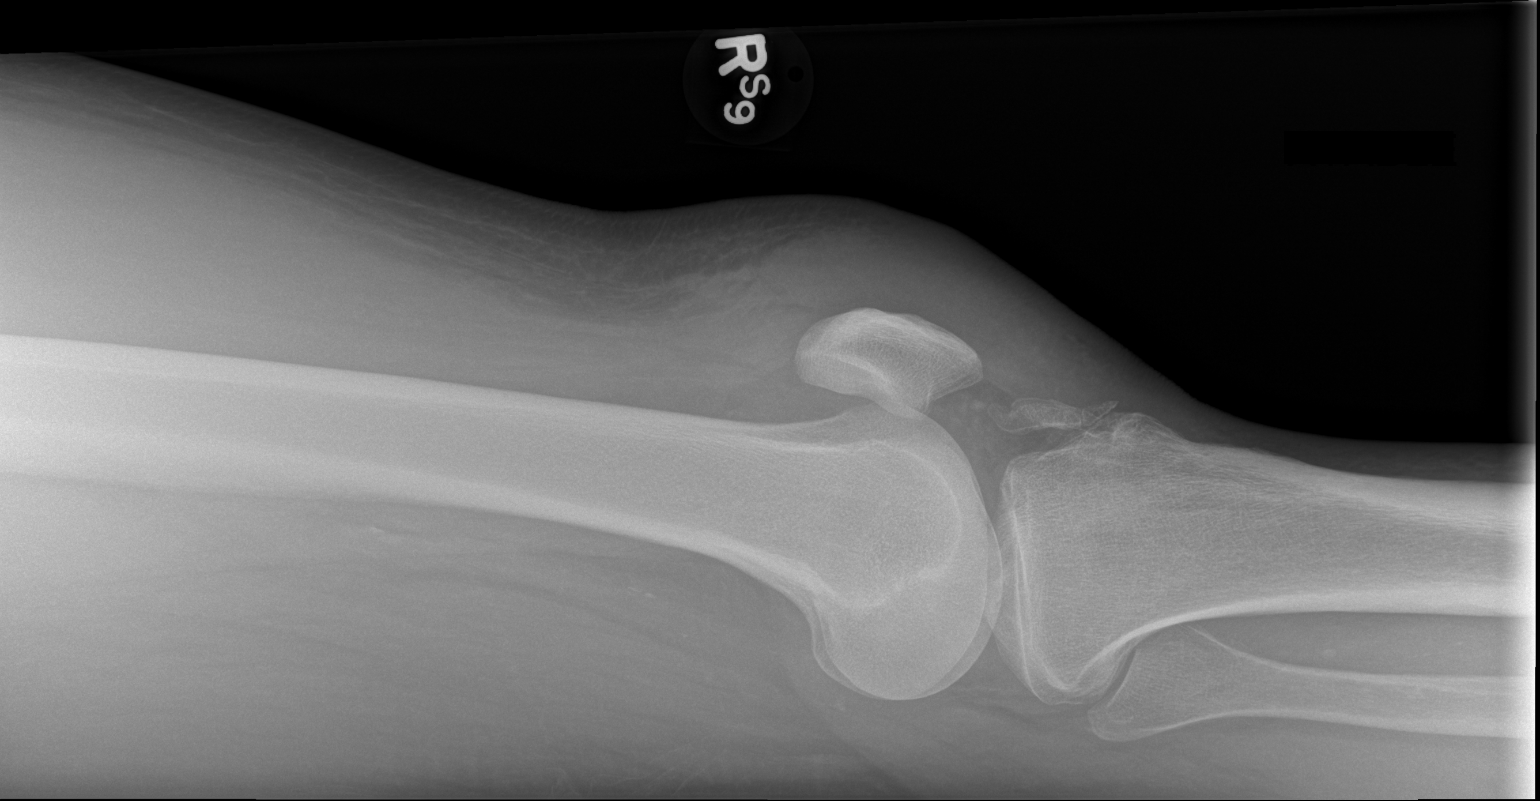

[4 of 4 positions shown; findings below may reference images not displayed]

FINDINGS: Soft tissue swelling over the knee anteriorly.
Irregularity noted that along the anterior tibial tuberosity
appears well corticated and likely related to old injury.  No
visible acute bony abnormality.  No joint effusion within the right
knee.
IMPRESSION: Marked soft tissue swelling over the right knee anteriorly.  No
acute bony abnormality.

## 2013-06-30 IMAGING — CR DG PELVIS 1-2V
1 series · 1 of 1 positions shown · non-contrast
Comparison: None

CLINICAL DATA: Fall, right hip pain.

PELVIS - 1-2 VIEW

[t pelvis ap]
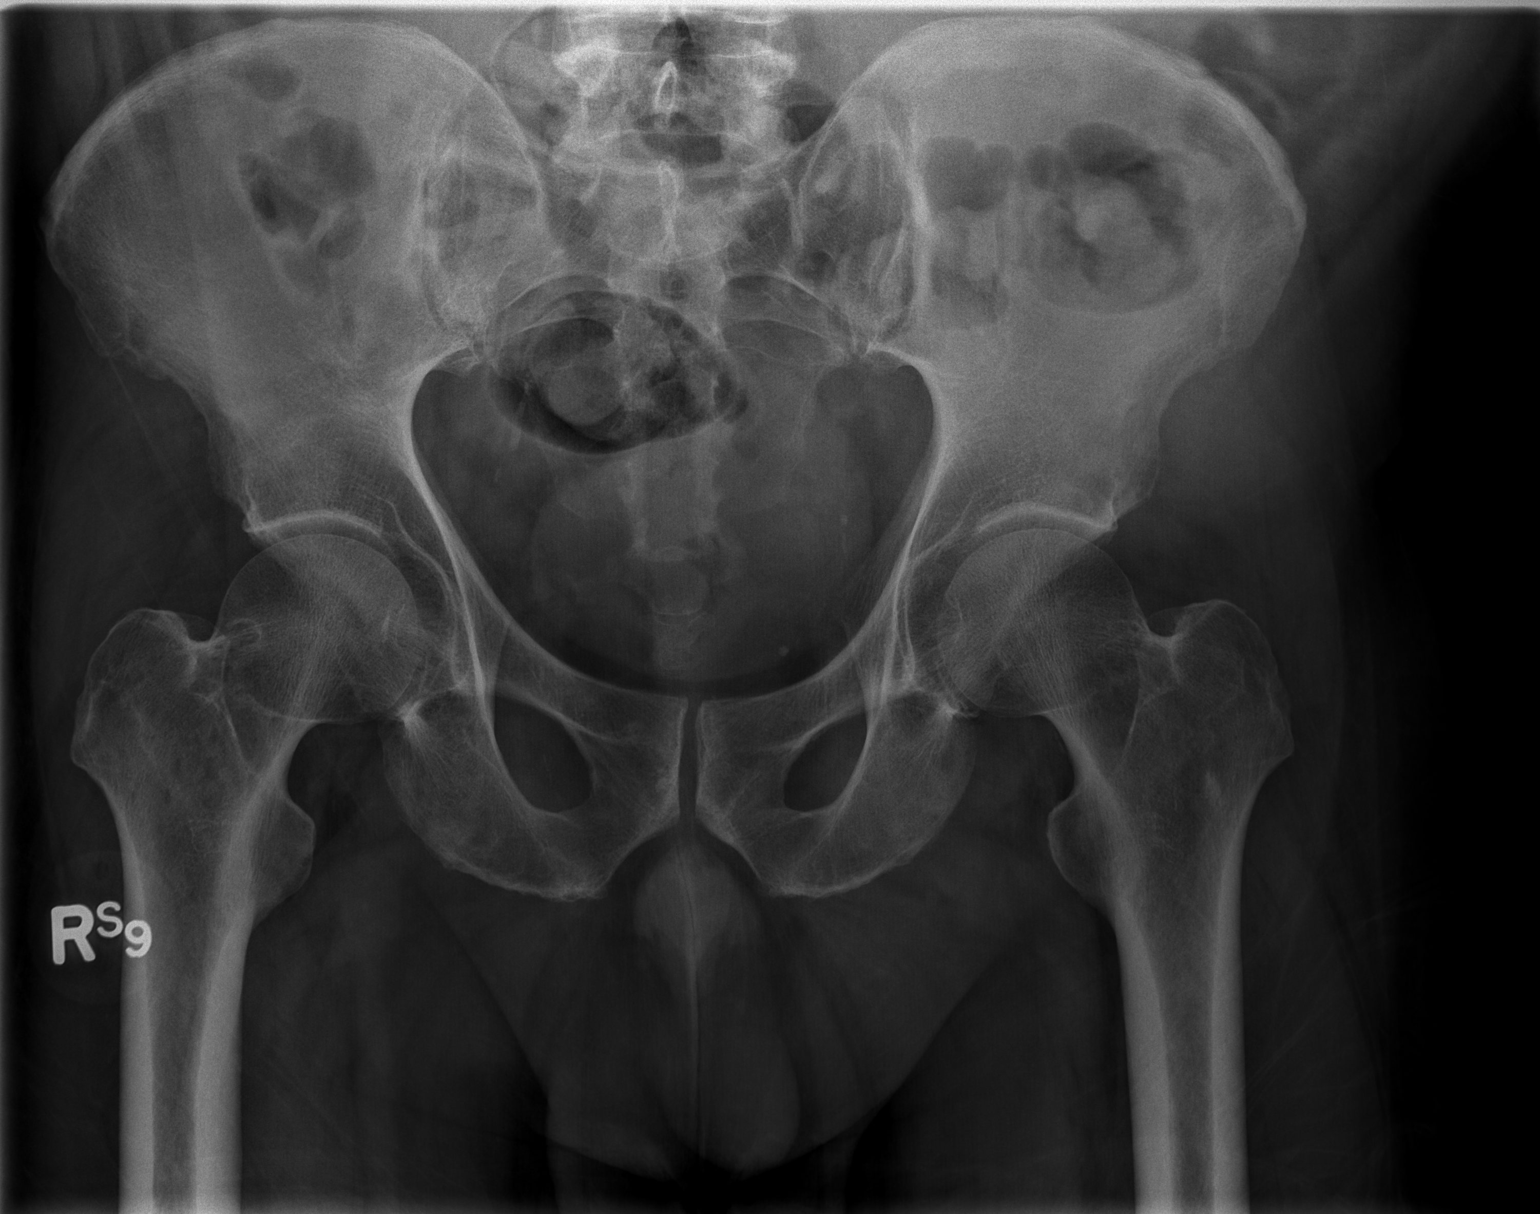

[1 of 1 positions shown; findings below may reference images not displayed]

FINDINGS: No acute bony abnormality.  Specifically, no fracture,
subluxation, or dislocation.  Soft tissues are intact.  SI joints
and hip joints are symmetric and unremarkable.
IMPRESSION: No acute bony abnormality.

## 2013-07-07 ENCOUNTER — Ambulatory Visit: Payer: 59 | Admitting: Nurse Practitioner

## 2013-07-16 ENCOUNTER — Institutional Professional Consult (permissible substitution): Payer: 59 | Admitting: Neurology

## 2013-07-21 ENCOUNTER — Telehealth: Payer: Self-pay | Admitting: *Deleted

## 2013-07-21 NOTE — Telephone Encounter (Signed)
Received a fax from [facility] # [id number] stating [employer] will not pay for his prescription for Xarelto 20mg  that he takes once daily. It is requiring a prior authorization again because the previous PA expired 06/25/13. I called [phone] and requested a prior auth. Spoke with Synetta Fail and it was approved until 07/21/2014 with confirmation #: [id number]. Informed insurance.

## 2013-07-22 ENCOUNTER — Ambulatory Visit: Payer: 59 | Admitting: Nurse Practitioner

## 2013-08-19 ENCOUNTER — Other Ambulatory Visit: Payer: Self-pay | Admitting: *Deleted

## 2013-08-19 DIAGNOSIS — I10 Essential (primary) hypertension: Secondary | ICD-10-CM

## 2013-08-19 DIAGNOSIS — R569 Unspecified convulsions: Secondary | ICD-10-CM

## 2013-08-19 DIAGNOSIS — G2111 Neuroleptic induced parkinsonism: Secondary | ICD-10-CM

## 2013-08-19 MED ORDER — CARBIDOPA-LEVODOPA 25-100 MG PO TABS: 1.0000 | ORAL_TABLET | Freq: Three times a day (TID) | ORAL | Status: AC

## 2013-08-19 MED ORDER — CARBAMAZEPINE 200 MG PO TABS: 300.0000 mg | ORAL_TABLET | Freq: Two times a day (BID) | ORAL | Status: AC

## 2013-08-19 MED ORDER — HYDROCHLOROTHIAZIDE 25 MG PO TABS: 25.0000 mg | ORAL_TABLET | Freq: Every day | ORAL | Status: AC

## 2013-08-19 MED ORDER — BENZTROPINE MESYLATE 1 MG PO TABS: 1.0000 mg | ORAL_TABLET | Freq: Two times a day (BID) | ORAL | Status: AC

## 2013-08-19 MED ORDER — PRAVASTATIN SODIUM 20 MG PO TABS: 20.0000 mg | ORAL_TABLET | Freq: Every evening | ORAL | Status: AC

## 2013-08-19 MED ORDER — NEBIVOLOL HCL 10 MG PO TABS: 10.0000 mg | ORAL_TABLET | Freq: Every day | ORAL | Status: AC

## 2013-08-19 MED ORDER — LEVOTHYROXINE SODIUM 75 MCG PO TABS: 75.0000 ug | ORAL_TABLET | Freq: Every morning | ORAL | Status: AC

## 2013-08-19 MED ORDER — LOSARTAN POTASSIUM 100 MG PO TABS: 100.0000 mg | ORAL_TABLET | Freq: Every day | ORAL | Status: AC

## 2013-08-25 NOTE — Progress Notes (Signed)
Patient ID: Derek Hicks, male   DOB: 14-Aug-1951, 62 y.o.   MRN: [id number]  [facility]  Allergies  Allergen Reactions  . Benzodiazepines Other (See Comments)    Causes aggression, paradoxical reaction  . Other     Anti-anxiety medications cause agitation  . Wellbutrin [Bupropion] Other (See Comments)    Reaction unknown    Chief Complaint  Patient presents with  . Acute Visit    coumadin management     HPI He is being seen for his coumadin management. His inr today is 2.4 and he is taking coumadin 3 mg daily for the management of his PE. There are no reports of concerns from the nursing staff and he is not voicing any concerns.  Past Medical History  Diagnosis Date  . COPD (chronic obstructive pulmonary disease)   . Pulmonary embolus   . Hypertension   . Bipolar 1 disorder   . Neuroleptic-induced Parkinsonism   . Anxiety   . Hyperlipidemia   . Falls infrequently   . Current use of long term anticoagulation   . Hypothyroidism   . Shortness of breath   . MI (myocardial infarction) 2006     Past Surgical History  Procedure Laterality Date  . Rotator cuff repair      x 2     Filed Vitals:   03/31/13 1408  BP: 132/87  Pulse: 80  Height: 5\' 6"  (1.676 m)  Weight: 228 lb (103.42 kg)    MEDICATIONS  Synthroid 75 mcg daily prilosec 20 mg daily Tegretol 300 mg twice daily Pravachol 20 mg daily Vit b 12 000 mcg daily zyprexa 7.5 mg nightly advair 250/50 twice daily Albuterol 2 puffs every 6 hours as needed Ativan 0.5 mg three times daily Sinemet 25/100 three times daily Cogentin 1 mg twice daily Cozaar 50 mg daily flomax 0.4 mg daily vicodin 5/325 mg and three times daily as needed      SIGNIFICANT DIAGNOSTIC EXAMS  12/05/12:  CXR:  low lung volumes with bibasilar atelectasis and potential small b/l pleural effusions 12/03/12:  CXR low lung volumes with cardiomegaly, vascular congestion, and bibasilar atelectasis 12/03/12:  xrays pelvis:  no acute bony  abnormality 12/03/12:  right hip:  marked soft tissue swelling over right knee anteriorly, no acute bony abnormality 12/03/12:  CT head/maxillofacial/neck w/o contrast:  atrophy and small vessel disease, right facial hematoma, no maxillofacial fx, hematoma overlies right zygomatic arch, normal alignment with DDD at C5-6, no acute cspine fx 12/03/12:  left knee xrays:  no acute bony abn 12/03/12:  right knee xrays: anterior soft tissue swelling, no acute bony abn   LABS REVIEWED:   12/03/12:  ast 69, alt 14, ap 43, alb 3.3, wbc 15.5, h/h 12.3/37,8, plts 347 12/05/12:  TSH 1.287, lipids:  tc 159, tg 200, hdl 38, ldl 81 12/07/12:  Na 137, K 4.4, BUN 13, cr 1, Ca 9.6, alb 2.5, wbc 10.7, h/h 9.7/29.7, plts 30 01-07-13: wbc 5.9; hgb 13.9; hct 41.;7 mcv 87.6; plt 252; glucose 98; bun 8; creat 0.89 k+ 3.8 Na++ 136; liver normal albumin 3.7; chol 176; ldl 95; trig 209; tsh 1.465; hgb a1c 5.5   Review of Systems  Constitutional: Negative for malaise/fatigue.  Respiratory: Negative for cough and shortness of breath.   Cardiovascular: Negative for chest pain and leg swelling.  Gastrointestinal: Negative for heartburn and constipation.  Musculoskeletal: Negative for myalgias and joint pain.  Skin: Negative.   Neurological: Negative for headaches.  Psychiatric/Behavioral: Negative for depression. The patient  is not nervous/anxious and does not have insomnia.     Physical Exam  Constitutional: He appears well-developed and well-nourished.  overweight  Neck: Neck supple. No JVD present. No thyromegaly present.  Cardiovascular: Normal rate, regular rhythm and intact distal pulses.   Respiratory: Effort normal and breath sounds normal. No respiratory distress.  GI: Soft. Bowel sounds are normal. He exhibits no distension. There is no tenderness.  Musculoskeletal: He exhibits no edema.  Neurological: He is alert.  Skin: Skin is warm and dry.  Psychiatric: He has a normal mood and affect.      ASSESSMENT/PLAN  PE and anticoagulation management; will continue his coumadin 3 mg daily and will check inr in one week and will continue to monitor his status

## 2013-09-03 ENCOUNTER — Telehealth: Payer: Self-pay

## 2013-09-03 MED ORDER — AMBULATORY NON FORMULARY MEDICATION: Status: AC

## 2013-09-03 NOTE — Telephone Encounter (Signed)
Order faxed to d/c

## 2013-09-03 NOTE — Telephone Encounter (Signed)
Okay to dc blood sugar

## 2013-09-03 NOTE — Telephone Encounter (Signed)
Patient needs an order to d/c checking his blood sugar, send order to [facility] in [place]. BS running [phone] Please advise ok to give order to d/c

## 2013-09-16 NOTE — Progress Notes (Signed)
This encounter was created in error - please disregard.

## 2013-09-17 ENCOUNTER — Other Ambulatory Visit: Payer: Self-pay | Admitting: *Deleted

## 2013-09-17 DIAGNOSIS — I2699 Other pulmonary embolism without acute cor pulmonale: Secondary | ICD-10-CM

## 2013-09-17 MED ORDER — RIVAROXABAN 20 MG PO TABS: 20.0000 mg | ORAL_TABLET | Freq: Every day | ORAL | Status: AC

## 2013-10-03 ENCOUNTER — Encounter: Payer: Self-pay | Admitting: Internal Medicine

## 2013-10-22 ENCOUNTER — Telehealth: Payer: Self-pay | Admitting: *Deleted

## 2013-10-22 NOTE — Telephone Encounter (Signed)
Spoke with patient's caregiver to check on patient, she states that he is doing good. [place] complete renovations, he will be back then and wants to come back to see Shanda BumpsJessica. Caregiver also reports that the patient has not fallen or had any seizures since his last visit, but does on occasions have some tremor activity.

## 2013-12-16 ENCOUNTER — Other Ambulatory Visit: Payer: Self-pay | Admitting: *Deleted

## 2014-01-07 ENCOUNTER — Telehealth: Payer: Self-pay | Admitting: *Deleted

## 2014-01-07 NOTE — Telephone Encounter (Signed)
Spoke with patient's care giver and she informed me that [place] will be seeing a doctor there until further notice.

## 2015-07-11 ENCOUNTER — Ambulatory Visit: Payer: Medicare Other | Admitting: Podiatry

## 2017-05-15 DEATH — deceased
